# Patient Record
Sex: Female | Born: 1945 | Race: White | Hispanic: No | Marital: Married | Smoking: Never smoker
Health system: Southern US, Community
[De-identification: ages and names within clinical notes are randomized; demographics above are authoritative.]

## PROBLEM LIST (undated history)

## (undated) DIAGNOSIS — R413 Other amnesia: Secondary | ICD-10-CM

## (undated) DIAGNOSIS — T4145XA Adverse effect of unspecified anesthetic, initial encounter: Secondary | ICD-10-CM

## (undated) DIAGNOSIS — E039 Hypothyroidism, unspecified: Secondary | ICD-10-CM

## (undated) DIAGNOSIS — K219 Gastro-esophageal reflux disease without esophagitis: Secondary | ICD-10-CM

## (undated) DIAGNOSIS — Z9889 Other specified postprocedural states: Secondary | ICD-10-CM

## (undated) DIAGNOSIS — F0390 Unspecified dementia without behavioral disturbance: Secondary | ICD-10-CM

## (undated) DIAGNOSIS — T8859XA Other complications of anesthesia, initial encounter: Secondary | ICD-10-CM

## (undated) DIAGNOSIS — F419 Anxiety disorder, unspecified: Secondary | ICD-10-CM

## (undated) DIAGNOSIS — E785 Hyperlipidemia, unspecified: Secondary | ICD-10-CM

## (undated) DIAGNOSIS — I10 Essential (primary) hypertension: Secondary | ICD-10-CM

## (undated) DIAGNOSIS — S72001A Fracture of unspecified part of neck of right femur, initial encounter for closed fracture: Secondary | ICD-10-CM

## (undated) DIAGNOSIS — M1990 Unspecified osteoarthritis, unspecified site: Secondary | ICD-10-CM

## (undated) DIAGNOSIS — R112 Nausea with vomiting, unspecified: Secondary | ICD-10-CM

## (undated) DIAGNOSIS — E119 Type 2 diabetes mellitus without complications: Secondary | ICD-10-CM

## (undated) HISTORY — PX: PARTIAL HYSTERECTOMY: SHX80

---

## 1999-02-08 ENCOUNTER — Encounter: Admission: RE | Admit: 1999-02-08 | Discharge: 1999-05-09 | Payer: Self-pay | Admitting: Internal Medicine

## 2000-04-23 ENCOUNTER — Encounter: Admission: RE | Admit: 2000-04-23 | Discharge: 2000-04-23 | Payer: Self-pay | Admitting: Internal Medicine

## 2000-04-23 ENCOUNTER — Encounter: Payer: Self-pay | Admitting: Internal Medicine

## 2000-07-25 ENCOUNTER — Encounter: Payer: Self-pay | Admitting: Internal Medicine

## 2000-07-25 ENCOUNTER — Encounter: Admission: RE | Admit: 2000-07-25 | Discharge: 2000-07-25 | Payer: Self-pay | Admitting: Internal Medicine

## 2001-02-13 ENCOUNTER — Encounter: Admission: RE | Admit: 2001-02-13 | Discharge: 2001-02-13 | Payer: Self-pay | Admitting: Internal Medicine

## 2001-02-13 ENCOUNTER — Encounter: Payer: Self-pay | Admitting: Internal Medicine

## 2007-08-05 ENCOUNTER — Inpatient Hospital Stay (HOSPITAL_COMMUNITY): Admission: RE | Admit: 2007-08-05 | Discharge: 2007-08-06 | Payer: Self-pay | Admitting: Orthopedic Surgery

## 2008-05-11 ENCOUNTER — Encounter: Admission: RE | Admit: 2008-05-11 | Discharge: 2008-05-11 | Payer: Self-pay | Admitting: Internal Medicine

## 2010-10-25 NOTE — Op Note (Signed)
NAME:  Chelsea Roberson, Chelsea Roberson NO.:  [id number]   MEDICAL RECORD NO.:  [id number]          PATIENT TYPE:  OIB   LOCATION:  5022                         FACILITY:  [facility]   PHYSICIAN:  Madelynn Done, MD  DATE OF BIRTH:  May 27, 1946   DATE OF PROCEDURE:  08/05/2007  DATE OF DISCHARGE:                               OPERATIVE REPORT   PREOPERATIVE DIAGNOSES:  1. Left intra-articular distal radius fracture of four or more      fragments.  2. Left thumb laceration, traumatic laceration.  3. Left ulnar styloid fracture.  4. Diabetes.  5. Obesity.   POSTOPERATIVE DIAGNOSES:  1. Left intra-articular distal radius fracture of four or more      fragments.  2. Left thumb laceration, traumatic laceration.  3. Left ulnar styloid fracture.  4. Diabetes.  5. Obesity.   ATTENDING SURGEON:  Dr. Bradly Bienenstock, who was scrubbed and present for  the entire procedure.   ASSISTANT SURGEON:  None.   PROCEDURE:  1. Repair of traumatic thumb laceration, 2.5 cm.  2. Left wrist, radius open reduction internal fixation of displaced      intra-articular fracture four or more fragments with internal      fixation and treatment of ulnar styloid fracture.  3. Stress x-rays, left wrist.   SURGICAL IMPLANTS:  Hand innovations narrow plate with locking pegs  distally and three 3.5 cortical screws proximally.   ANESTHESIA:  General via endotracheal tube.   TOURNIQUET TIME:  1 hour at 250 mmHg.   INTRAOPERATIVE FINDINGS:  The patient did have a comminuted intra-  articular distal radius fracture of four or more fragments that had a  large volar Barton's equivalent.  Radiographs three views of the wrist  were obtained intraoperatively which do show good restoration of radial  height, inclination and volar tilt with good restoration of the fracture  fragments.  Under live stress radiography, there did not appear to be  any pegs within the radiocarpal joint and a good alignment and good  motion with stress testing.  Intraoperative stress testing was then  done.  There did not appear to be any intercarpal ligament widening for  any instability of the distal radioulnar joint once completion of the  radius was done.   SURGICAL INDICATIONS:  Chelsea Roberson is a 65 year old left-hand dominant  female who sustained a fall earlier this afternoon, sustaining an open  laceration to her left thumb and a displaced intra-articular distal  radius fracture.  The patient was seen and evaluated in the office and  after evaluation, we elected to proceed with the above procedure today.  Risks, benefits and alternatives were discussed in detail with the  patient and signed informed consent was obtained on the day of surgery.  Risks include but not limited to bleeding, infection, damage to nearby  nerves, arteries or tendons, risk of anesthesia, nonunion, malunion,  hardware failure, loss of motion of the wrist and digits and need for  further surgical intervention and dystrophy of the hand.   DESCRIPTION OF PROCEDURE:  The patient was properly identified in preop  holding area  and a mark with a permanent marker was made on left wrist  to indicate correct operative side.  The patient then brought back to  the operating room, placed supine on the anesthesia table where general  anesthesia was administered via an endotracheal tube.  The patient  tolerated this well.  Well-padded tourniquet was then placed on the left  brachium and sealed with a 1000 drape.  Left upper extremity was then  prepped and draped in usual sterile fashion.  Time-out was called, the  correct site was identified and the procedure was then begun.  Attention  was then turned to the 2.5  cm laceration over the thumb where thorough  irrigation and debridement of the laceration was then carried out and  the simple laceration was then closed with three 4-0 nylon simple  sutures.  It was within the subcutaneous tissues and did  not extend  through the correction.  Following closure of the thumb laceration,  attention was then turned to the distal radius.  Using Esmarch  exsanguination, the tourniquet insufflated to 250 mmHg.  Using 10 pounds  of finger trap traction, the traction was then set up.  Longitudinal  incision was then made centered directly over the FCR.  Dissection was  carried down through the skin and subcutaneous tissue.  The interval  between the FCR and the radial artery was then approached.  The FCR  tendon sheath was then opened both proximally and distally.  The FCR  tendon sheath was retracted ulnarly.  Going through the floor of the FCR  sheath the FPL was identified.  An L-shaped pronator flap was then  created to expose the fracture fragments.  Following adequate exposure  of the fragments, an open reduction and extra-articular reduction was  then carried out.  There was a moderate degree of comminution and  several fragments.  Following the open reduction, the volar plate was  then applied to the distal radius and then held temporarily in place  with a 3-5 cortical screw in the oblong hole proximally.  The position  was then adjusted both proximally and distally as well as radially and  ulnarly.  Following C-arm confirmation of the plate placement the distal  fixation was then carried out and locking pegs with appropriate drill  depth gauge measurement and the locking pegs were then placed without  any difficulty.  After the distal construct was carried out the traction  was released.  The final proximal fixation was then achieved with two  more 3-5 bicortical screws.  Following placement of the surgical  implant, and reduction of the intra-articular fracture, final mini C-arm  images were then obtained.  The wounds were then thoroughly irrigated.  The pronator quadratus flap there was not good tissue and then a small  portion of it was closed over the plate distally, but it was unable  be  reapproximated proximally.  Following this, the tourniquet was then  deflated.  Hemostasis was obtained with direct pressure and bipolar  cautery.  Thorough irrigation was then carried out and subcutaneous  tissues closed with 4-0 Vicryl and sutures in the skin closed with a  running horizontal mattress 4-0 nylon suture.  20 mL of 0.5% Marcaine  were then infiltrated around the wound for local field block.  A sterile  compressive dressing was then applied.  Prior to placement of the  dressing the distal radioulnar joint was assessed.  The patient did have  an ulnar styloid fracture at the base  but there did not appear to be any  gross instability in neutral pronation and supination.  Sterile  compressive dressing was then applied.  The patient was then placed in  well molded sugar-tong splint.  She was then extubated and taken to  recovery room in good condition.   POSTOPERATIVE PLAN:  The patient will be admitted overnight for IV  antibiotics and pain control.  She will be likely discharged in the  morning.  Will see her back in the office in 10 days for wound check and  suture removal.  Likely three weeks of long-arm immobilization for the ulnar styloid  fracture and then likely three weeks of short-arm cast and a total of  likely six weeks immobilization.  The patient will be seen at the 10-  day, 3-week and 6-week mark.  X-rays at each visit.      Madelynn Done, MD  Electronically Signed     FWO/MEDQ  D:  08/05/2007  T:  08/06/2007  Job:  [id number]

## 2011-03-06 LAB — CBC
HCT: 40.7
Hemoglobin: 13.9
MCHC: 34.3
MCV: 93.9
Platelets: 156
RBC: 4.34
RDW: 13.8
WBC: 8.7

## 2011-03-06 LAB — COMPREHENSIVE METABOLIC PANEL
BUN: 16
Calcium: 9.1
Creatinine, Ser: 0.52
Glucose, Bld: 139 — ABNORMAL HIGH
Total Protein: 6.5

## 2012-12-25 ENCOUNTER — Encounter (HOSPITAL_COMMUNITY): Payer: Self-pay | Admitting: Nurse Practitioner

## 2012-12-25 ENCOUNTER — Emergency Department (HOSPITAL_COMMUNITY)
Admission: EM | Admit: 2012-12-25 | Discharge: 2012-12-25 | Disposition: A | Discharge status: Home / Self Care | Payer: Medicare Other | Attending: Emergency Medicine | Admitting: Emergency Medicine

## 2012-12-25 ENCOUNTER — Emergency Department (HOSPITAL_COMMUNITY): Payer: Medicare Other

## 2012-12-25 DIAGNOSIS — S29019A Strain of muscle and tendon of unspecified wall of thorax, initial encounter: Secondary | ICD-10-CM

## 2012-12-25 DIAGNOSIS — R079 Chest pain, unspecified: Secondary | ICD-10-CM

## 2012-12-25 DIAGNOSIS — IMO0002 Reserved for concepts with insufficient information to code with codable children: Secondary | ICD-10-CM | POA: Insufficient documentation

## 2012-12-25 DIAGNOSIS — W1809XA Striking against other object with subsequent fall, initial encounter: Secondary | ICD-10-CM | POA: Insufficient documentation

## 2012-12-25 DIAGNOSIS — S239XXA Sprain of unspecified parts of thorax, initial encounter: Secondary | ICD-10-CM | POA: Insufficient documentation

## 2012-12-25 DIAGNOSIS — S46011A Strain of muscle(s) and tendon(s) of the rotator cuff of right shoulder, initial encounter: Secondary | ICD-10-CM

## 2012-12-25 DIAGNOSIS — Y929 Unspecified place or not applicable: Secondary | ICD-10-CM | POA: Insufficient documentation

## 2012-12-25 DIAGNOSIS — S298XXA Other specified injuries of thorax, initial encounter: Secondary | ICD-10-CM | POA: Insufficient documentation

## 2012-12-25 DIAGNOSIS — E119 Type 2 diabetes mellitus without complications: Secondary | ICD-10-CM | POA: Insufficient documentation

## 2012-12-25 DIAGNOSIS — Y939 Activity, unspecified: Secondary | ICD-10-CM | POA: Insufficient documentation

## 2012-12-25 DIAGNOSIS — I10 Essential (primary) hypertension: Secondary | ICD-10-CM | POA: Insufficient documentation

## 2012-12-25 HISTORY — DX: Essential (primary) hypertension: I10

## 2012-12-25 LAB — D-DIMER, QUANTITATIVE: D-Dimer, Quant: 0.3 ug/mL-FEU (ref 0.00–0.48)

## 2012-12-25 LAB — CBC
HCT: 34.6 % — ABNORMAL LOW (ref 36.0–46.0)
Hemoglobin: 11.5 g/dL — ABNORMAL LOW (ref 12.0–15.0)
MCV: 89.9 fL (ref 78.0–100.0)
Platelets: 150 10*3/uL (ref 150–400)
RBC: 3.85 MIL/uL — ABNORMAL LOW (ref 3.87–5.11)
WBC: 6 10*3/uL (ref 4.0–10.5)

## 2012-12-25 LAB — BASIC METABOLIC PANEL
CO2: 26 mEq/L (ref 19–32)
Chloride: 104 mEq/L (ref 96–112)
Creatinine, Ser: 0.71 mg/dL (ref 0.50–1.10)

## 2012-12-25 LAB — POCT I-STAT TROPONIN I: Troponin i, poc: 0.02 ng/mL (ref 0.00–0.08)

## 2012-12-25 MED ORDER — NAPROXEN SODIUM 220 MG PO TABS: 220.0000 mg | ORAL_TABLET | Freq: Two times a day (BID) | ORAL | Status: DC

## 2012-12-25 MED ORDER — POTASSIUM CHLORIDE CRYS ER 20 MEQ PO TBCR
40.0000 meq | EXTENDED_RELEASE_TABLET | Freq: Once | ORAL | Status: AC
  Administered 2012-12-25: 40 meq via ORAL
  Filled 2012-12-25: qty 2

## 2012-12-25 MED ORDER — HYDROCODONE-ACETAMINOPHEN 5-325 MG PO TABS: 1.0000 | ORAL_TABLET | ORAL | Status: DC | PRN

## 2012-12-25 NOTE — ED Notes (Signed)
MD at bedside. 

## 2012-12-25 NOTE — ED Notes (Signed)
EKG was done at 15:12 and signed by DR Denton Lank

## 2012-12-25 NOTE — ED Provider Notes (Signed)
History    CSN: [id number] Arrival date & time 12/25/12  1419  First MD Initiated Contact with Patient 12/25/12 1500     Chief Complaint  Patient presents with  . Chest Pain   (Consider location/radiation/quality/duration/timing/severity/associated sxs/prior Treatment) HPI Comments: Pt w/ PMHx of DM, HTN and HLD now w/ chest pain. States 1.5 wks of right shoulder, chest wall pain. Pleuritic, a/w dyspnea. Not exertional. [place] Seat 1 wk ago against dresser and hit right arm/chest. Pain exacerbated w/ laying on right side. No nausea, diaphoresis, cough, fever or rash. No Hx of CAD. Clean cath 20 yrs ago, no recent stress test. Pain is constant and only a/w inspiration and laying on right side. Denies radiculopathy or weakness of right arm. Pain exacerbated w/ shoulder extension.   Patient is a 67 y.o. female presenting with general illness. The history is provided by the patient. No language interpreter was used.  Illness Location:  Musculoskeletal, cardio/pulm Quality:  Chest and shoulder pain Severity:  Moderate Onset quality:  Gradual Duration:  10 days Timing:  Intermittent Progression:  Worsening Chronicity:  New Associated symptoms: chest pain, myalgias and shortness of breath   Associated symptoms: no abdominal pain, no congestion, no cough, no diarrhea, no fever, no headaches, no nausea, no rash, no sore throat and no vomiting    Past Medical History  Diagnosis Date  . Hypertension   . Diabetes mellitus without complication    History reviewed. No pertinent past surgical history. History reviewed. No pertinent family history. History  Substance Use Topics  . Smoking status: Never Smoker   . Smokeless tobacco: Not on file  . Alcohol Use: No   OB History   Grav Para Term Preterm Abortions TAB SAB Ect Mult Living                 Review of Systems  Constitutional: Negative for fever and chills.  HENT: Negative for congestion and sore throat.   Respiratory: Positive for  shortness of breath. Negative for cough.   Cardiovascular: Positive for chest pain. Negative for leg swelling.  Gastrointestinal: Negative for nausea, vomiting, abdominal pain, diarrhea and constipation.  Genitourinary: Negative for dysuria and frequency.  Musculoskeletal: Positive for myalgias and back pain.  Skin: Negative for color change and rash.  Neurological: Negative for dizziness and headaches.  Psychiatric/Behavioral: Negative for confusion and agitation.  All other systems reviewed and are negative.    Allergies  Sulfa antibiotics  Home Medications  No current outpatient prescriptions on file. BP 175/68  Pulse 81  Temp(Src) 98 F (36.7 C) (Oral)  Resp 16  Ht 5\' 2"  (1.575 m)  Wt 215 lb (97.523 kg)  BMI 39.31 kg/m2  SpO2 97% Physical Exam  Vitals reviewed. Constitutional: She is oriented to person, place, and time. She appears well-developed and well-nourished. No distress.  HENT:  Head: Normocephalic and atraumatic.  Eyes: EOM are normal. Pupils are equal, round, and reactive to light.  Neck: Normal range of motion. Neck supple.  Cardiovascular: Normal rate, regular rhythm and normal heart sounds.   Pulmonary/Chest: Effort normal and breath sounds normal. Not tachypneic. No respiratory distress. She has no decreased breath sounds. She has no wheezes. She has no rhonchi. She has no rales.    Abdominal: Soft. She exhibits no distension.  Musculoskeletal: Normal range of motion. She exhibits no edema.       Arms: Neurological: She is alert and oriented to person, place, and time.  Skin: Skin is warm and dry.  Psychiatric:  She has a normal mood and affect. Her behavior is normal.    ED Course  Procedures (including critical care time) DG Chest 2 View (Final result)  Result time: 12/25/12 15:42:56    Final result by Rad Results In Interface (12/25/12 15:42:56)    Narrative:   *RADIOLOGY REPORT*  Clinical Data: Chest pain. Weakness. Right posterior  shoulder pain with limited range of motion.  CHEST - 2 VIEW  Comparison: 09/28/2011  Findings: Mild cardiomegaly observed with mild lingular scarring. Thoracic spondylosis is present. There is mild tortuosity of the thoracic aorta. Bilateral humeral head spurring noted. No pleural effusion identified.  IMPRESSION:  1. Mild cardiomegaly, without edema. 2. Minimal lingular scarring. 3. Degenerative spurring of the humeral heads bilaterally. 4. Thoracic spondylosis.   Original Report Authenticated By: Gaylyn Rong, M.D.             DG Shoulder Right (Final result)  Result time: 12/25/12 15:44:25    Final result by Rad Results In Interface (12/25/12 15:44:25)    Narrative:   *RADIOLOGY REPORT*  Clinical Data: Chest pain. Weakness. Right posterior shoulder pain with reduced range of motion.  RIGHT SHOULDER - 2+ VIEW  Comparison: None.  Findings: Prominent inferomedial spurring of the humeral head observed. There is spurring of the glenoid rim.  No fracture or dislocation.  IMPRESSION:  1. Degenerative glenohumeral arthropathy with prominent spurring from the humeral head to moderate spurring of the glenoid.   Original Report Authenticated By: Gaylyn Rong, M.D.         Date: 12/25/2012  Rate: 74  Rhythm: normal sinus rhythm  QRS Axis: normal  Intervals: normal  ST/T Wave abnormalities: normal  Conduction Disutrbances:none  Narrative Interpretation:   Old EKG Reviewed: none available  Results for orders placed during the hospital encounter of 12/25/12  CBC      Result Value Range   WBC 6.0  4.0 - 10.5 K/uL   RBC 3.85 (*) 3.87 - 5.11 MIL/uL   Hemoglobin 11.5 (*) 12.0 - 15.0 g/dL   HCT 47.8 (*) 29.5 - 62.1 %   MCV 89.9  78.0 - 100.0 fL   MCH 29.9  26.0 - 34.0 pg   MCHC 33.2  30.0 - 36.0 g/dL   RDW 30.8  65.7 - 84.6 %   Platelets 150  150 - 400 K/uL  BASIC METABOLIC PANEL      Result Value Range   Sodium 138  135 - 145 mEq/L    Potassium 3.4 (*) 3.5 - 5.1 mEq/L   Chloride 104  96 - 112 mEq/L   CO2 26  19 - 32 mEq/L   Glucose, Bld 124 (*) 70 - 99 mg/dL   BUN 17  6 - 23 mg/dL   Creatinine, Ser 9.62  0.50 - 1.10 mg/dL   Calcium 9.4  8.4 - 95.2 mg/dL   GFR calc non Af Amer 87 (*) >90 mL/min   GFR calc Af Amer >90  >90 mL/min  D-DIMER, QUANTITATIVE      Result Value Range   D-Dimer, Quant 0.30  0.00 - 0.48 ug/mL-FEU  POCT I-STAT TROPONIN I      Result Value Range   Troponin i, poc 0.02  0.00 - 0.08 ng/mL   Comment 3           POCT I-STAT TROPONIN I      Result Value Range   Troponin i, poc 0.02  0.00 - 0.08 ng/mL   Comment 3  No results found. No diagnosis found.  MDM  Exam as above, significant for pain w/ rotator cuff provocative testing, ttp right paraspinal upper thoracic. No dermatomal rash. ECG w/out acute ischemia. CXR - NACPF, No fx, no ptx, no infiltrated, mild cardiomegaly. right shoulder xray w/ degenerative GH changes. 3 hr troponin x 2 negative, no anemia, no renal failure, 3.4 - given 40 meq PO. D. Dimer neg. All other labs unremarkable.   Doubt ACS, spont ptx, dissection, tamponade, boerhaave, or shingles. Likely rotator cuff and thoracic strain - will recommend aleve BID, give small quant norco for break through pain. Follow up w/ pts ortho for further eval. D/w cardiology  - Dr Anne Fu - pt will follow up in outpt office for further eval and stress test. They will call for apt.  At this time chest pain is atypical - low likelihood for ACS. Stable for d/c home. Given return precautions.   I have personally reviewed labs and imaging and considered in my MDM. Case d/w Dr Hyacinth Meeker  1. Rotator cuff (capsule) sprain and strain, right, initial encounter   2. Thoracic myofascial strain, initial encounter   3. Chest pain    Discharge Medication List as of 12/25/2012  7:25 PM    START taking these medications   Details  HYDROcodone-acetaminophen (NORCO) 5-325 MG per tablet Take 1 tablet by  mouth every 4 (four) hours as needed for pain., Starting 12/25/2012, Until Discontinued, Print    naproxen sodium (ALEVE) 220 MG tablet Take 1 tablet (220 mg total) by mouth 2 (two) times daily with a meal., Starting 12/25/2012, Until Discontinued, Print       Lesleigh Noe, MD [address] [place] [address] [phone]  Schedule an appointment as soon as possible for a visit      follow up with your orthopedic doctor for further eval of shoulder pain    Audelia Hives, MD 12/25/12 1949

## 2012-12-25 NOTE — ED Notes (Signed)
Patient transported to X-ray 

## 2012-12-25 NOTE — ED Notes (Signed)
Pt reports nagging R arm pain since last week, over past 2 days pain began to move into R breast and then R chest. Pain is worse with inspiration. Pt did trip last week and hit her R arm on a bookcase, bruise noted to RFA. A&Ox4, resp e/u

## 2012-12-28 NOTE — ED Provider Notes (Signed)
I have personally evaluated this patient with the resident, I agree with their interpretation of the EKG, I have personally interpreted the EKG as well. His pain seems to be pleuritic, reproducible only with deep breathing, she does note having a fall on her side with shoulder pain and chest pain however this pain is nonreproducible on my exam. She has clear heart and lung sounds, no peripheral edema, workup is unremarkable for cardiac etiology.  I saw and evaluated the patient, reviewed the resident's note and I agree with the findings and plan.   Vida Roller, MD 12/28/12 [phone]

## 2013-01-02 ENCOUNTER — Other Ambulatory Visit: Payer: Self-pay | Admitting: Internal Medicine

## 2013-01-02 DIAGNOSIS — E010 Iodine-deficiency related diffuse (endemic) goiter: Secondary | ICD-10-CM

## 2013-01-06 ENCOUNTER — Other Ambulatory Visit: Payer: Medicare Other

## 2013-01-20 ENCOUNTER — Ambulatory Visit
Admission: RE | Admit: 2013-01-20 | Discharge: 2013-01-20 | Disposition: A | Discharge status: Home / Self Care | Payer: Medicare Other | Source: Ambulatory Visit | Attending: Internal Medicine | Admitting: Internal Medicine

## 2013-01-20 DIAGNOSIS — E010 Iodine-deficiency related diffuse (endemic) goiter: Secondary | ICD-10-CM

## 2013-01-23 ENCOUNTER — Other Ambulatory Visit: Payer: Self-pay | Admitting: Internal Medicine

## 2013-01-23 DIAGNOSIS — E041 Nontoxic single thyroid nodule: Secondary | ICD-10-CM

## 2013-01-27 ENCOUNTER — Other Ambulatory Visit: Payer: Self-pay | Admitting: Internal Medicine

## 2013-01-27 DIAGNOSIS — E041 Nontoxic single thyroid nodule: Secondary | ICD-10-CM

## 2013-01-29 ENCOUNTER — Other Ambulatory Visit (HOSPITAL_COMMUNITY)
Admission: RE | Admit: 2013-01-29 | Discharge: 2013-01-29 | Disposition: A | Discharge status: Home / Self Care | Payer: Medicare Other | Source: Ambulatory Visit | Attending: Internal Medicine | Admitting: Internal Medicine

## 2013-01-29 ENCOUNTER — Ambulatory Visit
Admission: RE | Admit: 2013-01-29 | Discharge: 2013-01-29 | Disposition: A | Discharge status: Home / Self Care | Payer: Medicare Other | Source: Ambulatory Visit | Attending: Internal Medicine | Admitting: Internal Medicine

## 2013-01-29 DIAGNOSIS — E041 Nontoxic single thyroid nodule: Secondary | ICD-10-CM

## 2013-04-02 ENCOUNTER — Encounter (INDEPENDENT_AMBULATORY_CARE_PROVIDER_SITE_OTHER): Payer: Medicare Other | Admitting: Ophthalmology

## 2013-04-02 DIAGNOSIS — I10 Essential (primary) hypertension: Secondary | ICD-10-CM

## 2013-04-02 DIAGNOSIS — H35039 Hypertensive retinopathy, unspecified eye: Secondary | ICD-10-CM

## 2013-04-02 DIAGNOSIS — E1139 Type 2 diabetes mellitus with other diabetic ophthalmic complication: Secondary | ICD-10-CM

## 2013-04-02 DIAGNOSIS — H43819 Vitreous degeneration, unspecified eye: Secondary | ICD-10-CM

## 2013-04-02 DIAGNOSIS — E11319 Type 2 diabetes mellitus with unspecified diabetic retinopathy without macular edema: Secondary | ICD-10-CM

## 2013-04-02 DIAGNOSIS — H2510 Age-related nuclear cataract, unspecified eye: Secondary | ICD-10-CM

## 2013-04-02 DIAGNOSIS — E11311 Type 2 diabetes mellitus with unspecified diabetic retinopathy with macular edema: Secondary | ICD-10-CM

## 2013-04-07 ENCOUNTER — Encounter (INDEPENDENT_AMBULATORY_CARE_PROVIDER_SITE_OTHER): Payer: Medicare Other | Admitting: Ophthalmology

## 2013-04-07 DIAGNOSIS — E11319 Type 2 diabetes mellitus with unspecified diabetic retinopathy without macular edema: Secondary | ICD-10-CM

## 2013-04-07 DIAGNOSIS — H35039 Hypertensive retinopathy, unspecified eye: Secondary | ICD-10-CM

## 2013-04-07 DIAGNOSIS — H43819 Vitreous degeneration, unspecified eye: Secondary | ICD-10-CM

## 2013-04-07 DIAGNOSIS — E11311 Type 2 diabetes mellitus with unspecified diabetic retinopathy with macular edema: Secondary | ICD-10-CM

## 2013-04-07 DIAGNOSIS — I10 Essential (primary) hypertension: Secondary | ICD-10-CM

## 2013-04-07 DIAGNOSIS — E1139 Type 2 diabetes mellitus with other diabetic ophthalmic complication: Secondary | ICD-10-CM

## 2013-04-30 ENCOUNTER — Encounter (INDEPENDENT_AMBULATORY_CARE_PROVIDER_SITE_OTHER): Payer: Medicare Other | Admitting: Ophthalmology

## 2013-04-30 DIAGNOSIS — H43819 Vitreous degeneration, unspecified eye: Secondary | ICD-10-CM

## 2013-04-30 DIAGNOSIS — E1139 Type 2 diabetes mellitus with other diabetic ophthalmic complication: Secondary | ICD-10-CM

## 2013-04-30 DIAGNOSIS — H35039 Hypertensive retinopathy, unspecified eye: Secondary | ICD-10-CM

## 2013-04-30 DIAGNOSIS — E11319 Type 2 diabetes mellitus with unspecified diabetic retinopathy without macular edema: Secondary | ICD-10-CM

## 2013-04-30 DIAGNOSIS — E11311 Type 2 diabetes mellitus with unspecified diabetic retinopathy with macular edema: Secondary | ICD-10-CM

## 2013-04-30 DIAGNOSIS — H2510 Age-related nuclear cataract, unspecified eye: Secondary | ICD-10-CM

## 2013-04-30 DIAGNOSIS — I10 Essential (primary) hypertension: Secondary | ICD-10-CM

## 2013-05-28 ENCOUNTER — Encounter (INDEPENDENT_AMBULATORY_CARE_PROVIDER_SITE_OTHER): Payer: Medicare Other | Admitting: Ophthalmology

## 2013-05-28 DIAGNOSIS — H35039 Hypertensive retinopathy, unspecified eye: Secondary | ICD-10-CM

## 2013-05-28 DIAGNOSIS — E11311 Type 2 diabetes mellitus with unspecified diabetic retinopathy with macular edema: Secondary | ICD-10-CM

## 2013-05-28 DIAGNOSIS — E11319 Type 2 diabetes mellitus with unspecified diabetic retinopathy without macular edema: Secondary | ICD-10-CM

## 2013-05-28 DIAGNOSIS — E1139 Type 2 diabetes mellitus with other diabetic ophthalmic complication: Secondary | ICD-10-CM

## 2013-05-28 DIAGNOSIS — H43819 Vitreous degeneration, unspecified eye: Secondary | ICD-10-CM

## 2013-05-28 DIAGNOSIS — I10 Essential (primary) hypertension: Secondary | ICD-10-CM

## 2013-06-23 ENCOUNTER — Encounter (INDEPENDENT_AMBULATORY_CARE_PROVIDER_SITE_OTHER): Payer: Medicare Other | Admitting: Ophthalmology

## 2013-06-23 DIAGNOSIS — E11319 Type 2 diabetes mellitus with unspecified diabetic retinopathy without macular edema: Secondary | ICD-10-CM

## 2013-06-23 DIAGNOSIS — H2510 Age-related nuclear cataract, unspecified eye: Secondary | ICD-10-CM

## 2013-06-23 DIAGNOSIS — H35039 Hypertensive retinopathy, unspecified eye: Secondary | ICD-10-CM

## 2013-06-23 DIAGNOSIS — E11311 Type 2 diabetes mellitus with unspecified diabetic retinopathy with macular edema: Secondary | ICD-10-CM

## 2013-06-23 DIAGNOSIS — H43819 Vitreous degeneration, unspecified eye: Secondary | ICD-10-CM

## 2013-06-23 DIAGNOSIS — I10 Essential (primary) hypertension: Secondary | ICD-10-CM

## 2013-06-23 DIAGNOSIS — E1165 Type 2 diabetes mellitus with hyperglycemia: Secondary | ICD-10-CM

## 2013-06-23 DIAGNOSIS — E1139 Type 2 diabetes mellitus with other diabetic ophthalmic complication: Secondary | ICD-10-CM

## 2013-07-21 ENCOUNTER — Encounter (INDEPENDENT_AMBULATORY_CARE_PROVIDER_SITE_OTHER): Payer: Medicare Other | Admitting: Ophthalmology

## 2013-07-21 DIAGNOSIS — H43819 Vitreous degeneration, unspecified eye: Secondary | ICD-10-CM

## 2013-07-21 DIAGNOSIS — H35039 Hypertensive retinopathy, unspecified eye: Secondary | ICD-10-CM

## 2013-07-21 DIAGNOSIS — H3581 Retinal edema: Secondary | ICD-10-CM

## 2013-07-21 DIAGNOSIS — I10 Essential (primary) hypertension: Secondary | ICD-10-CM

## 2013-07-21 DIAGNOSIS — E1165 Type 2 diabetes mellitus with hyperglycemia: Secondary | ICD-10-CM

## 2013-07-21 DIAGNOSIS — E1139 Type 2 diabetes mellitus with other diabetic ophthalmic complication: Secondary | ICD-10-CM

## 2013-07-21 DIAGNOSIS — H2510 Age-related nuclear cataract, unspecified eye: Secondary | ICD-10-CM

## 2013-07-21 DIAGNOSIS — E11319 Type 2 diabetes mellitus with unspecified diabetic retinopathy without macular edema: Secondary | ICD-10-CM

## 2013-09-29 ENCOUNTER — Encounter (INDEPENDENT_AMBULATORY_CARE_PROVIDER_SITE_OTHER): Payer: Medicare Other | Admitting: Ophthalmology

## 2013-09-29 DIAGNOSIS — E11311 Type 2 diabetes mellitus with unspecified diabetic retinopathy with macular edema: Secondary | ICD-10-CM

## 2013-09-29 DIAGNOSIS — H35039 Hypertensive retinopathy, unspecified eye: Secondary | ICD-10-CM

## 2013-09-29 DIAGNOSIS — I10 Essential (primary) hypertension: Secondary | ICD-10-CM

## 2013-09-29 DIAGNOSIS — H2510 Age-related nuclear cataract, unspecified eye: Secondary | ICD-10-CM

## 2013-09-29 DIAGNOSIS — E11319 Type 2 diabetes mellitus with unspecified diabetic retinopathy without macular edema: Secondary | ICD-10-CM

## 2013-09-29 DIAGNOSIS — E1139 Type 2 diabetes mellitus with other diabetic ophthalmic complication: Secondary | ICD-10-CM

## 2013-09-29 DIAGNOSIS — H43819 Vitreous degeneration, unspecified eye: Secondary | ICD-10-CM

## 2013-09-29 DIAGNOSIS — E1165 Type 2 diabetes mellitus with hyperglycemia: Secondary | ICD-10-CM

## 2013-10-27 ENCOUNTER — Encounter (INDEPENDENT_AMBULATORY_CARE_PROVIDER_SITE_OTHER): Payer: Medicare Other | Admitting: Ophthalmology

## 2013-10-27 DIAGNOSIS — E1165 Type 2 diabetes mellitus with hyperglycemia: Secondary | ICD-10-CM

## 2013-10-27 DIAGNOSIS — E11311 Type 2 diabetes mellitus with unspecified diabetic retinopathy with macular edema: Secondary | ICD-10-CM

## 2013-10-27 DIAGNOSIS — H43819 Vitreous degeneration, unspecified eye: Secondary | ICD-10-CM

## 2013-10-27 DIAGNOSIS — E11319 Type 2 diabetes mellitus with unspecified diabetic retinopathy without macular edema: Secondary | ICD-10-CM

## 2013-10-27 DIAGNOSIS — I10 Essential (primary) hypertension: Secondary | ICD-10-CM

## 2013-10-27 DIAGNOSIS — H35039 Hypertensive retinopathy, unspecified eye: Secondary | ICD-10-CM

## 2013-10-27 DIAGNOSIS — H2510 Age-related nuclear cataract, unspecified eye: Secondary | ICD-10-CM

## 2013-10-27 DIAGNOSIS — E1139 Type 2 diabetes mellitus with other diabetic ophthalmic complication: Secondary | ICD-10-CM

## 2013-11-24 ENCOUNTER — Encounter (INDEPENDENT_AMBULATORY_CARE_PROVIDER_SITE_OTHER): Payer: Medicare Other | Admitting: Ophthalmology

## 2013-11-24 DIAGNOSIS — E1165 Type 2 diabetes mellitus with hyperglycemia: Secondary | ICD-10-CM

## 2013-11-24 DIAGNOSIS — E11319 Type 2 diabetes mellitus with unspecified diabetic retinopathy without macular edema: Secondary | ICD-10-CM

## 2013-11-24 DIAGNOSIS — I10 Essential (primary) hypertension: Secondary | ICD-10-CM

## 2013-11-24 DIAGNOSIS — H2510 Age-related nuclear cataract, unspecified eye: Secondary | ICD-10-CM

## 2013-11-24 DIAGNOSIS — H35039 Hypertensive retinopathy, unspecified eye: Secondary | ICD-10-CM

## 2013-11-24 DIAGNOSIS — E11311 Type 2 diabetes mellitus with unspecified diabetic retinopathy with macular edema: Secondary | ICD-10-CM

## 2013-11-24 DIAGNOSIS — E1139 Type 2 diabetes mellitus with other diabetic ophthalmic complication: Secondary | ICD-10-CM

## 2014-01-19 ENCOUNTER — Encounter (INDEPENDENT_AMBULATORY_CARE_PROVIDER_SITE_OTHER): Payer: Medicare Other | Admitting: Ophthalmology

## 2014-01-19 DIAGNOSIS — H43819 Vitreous degeneration, unspecified eye: Secondary | ICD-10-CM

## 2014-01-19 DIAGNOSIS — E1165 Type 2 diabetes mellitus with hyperglycemia: Secondary | ICD-10-CM

## 2014-01-19 DIAGNOSIS — E11319 Type 2 diabetes mellitus with unspecified diabetic retinopathy without macular edema: Secondary | ICD-10-CM

## 2014-01-19 DIAGNOSIS — E11311 Type 2 diabetes mellitus with unspecified diabetic retinopathy with macular edema: Secondary | ICD-10-CM

## 2014-01-19 DIAGNOSIS — H35039 Hypertensive retinopathy, unspecified eye: Secondary | ICD-10-CM

## 2014-01-19 DIAGNOSIS — I10 Essential (primary) hypertension: Secondary | ICD-10-CM

## 2014-01-19 DIAGNOSIS — E1139 Type 2 diabetes mellitus with other diabetic ophthalmic complication: Secondary | ICD-10-CM

## 2014-02-18 ENCOUNTER — Encounter (INDEPENDENT_AMBULATORY_CARE_PROVIDER_SITE_OTHER): Payer: Medicare Other | Admitting: Ophthalmology

## 2014-02-18 DIAGNOSIS — H2510 Age-related nuclear cataract, unspecified eye: Secondary | ICD-10-CM

## 2014-02-18 DIAGNOSIS — E1139 Type 2 diabetes mellitus with other diabetic ophthalmic complication: Secondary | ICD-10-CM

## 2014-02-18 DIAGNOSIS — E1165 Type 2 diabetes mellitus with hyperglycemia: Secondary | ICD-10-CM

## 2014-02-18 DIAGNOSIS — E11311 Type 2 diabetes mellitus with unspecified diabetic retinopathy with macular edema: Secondary | ICD-10-CM

## 2014-02-18 DIAGNOSIS — I10 Essential (primary) hypertension: Secondary | ICD-10-CM

## 2014-02-18 DIAGNOSIS — H43819 Vitreous degeneration, unspecified eye: Secondary | ICD-10-CM

## 2014-02-18 DIAGNOSIS — E11319 Type 2 diabetes mellitus with unspecified diabetic retinopathy without macular edema: Secondary | ICD-10-CM

## 2014-02-18 DIAGNOSIS — H35039 Hypertensive retinopathy, unspecified eye: Secondary | ICD-10-CM

## 2014-04-01 ENCOUNTER — Encounter (INDEPENDENT_AMBULATORY_CARE_PROVIDER_SITE_OTHER): Payer: Medicare Other | Admitting: Ophthalmology

## 2014-04-01 DIAGNOSIS — H43813 Vitreous degeneration, bilateral: Secondary | ICD-10-CM

## 2014-04-01 DIAGNOSIS — H35033 Hypertensive retinopathy, bilateral: Secondary | ICD-10-CM

## 2014-04-01 DIAGNOSIS — E11311 Type 2 diabetes mellitus with unspecified diabetic retinopathy with macular edema: Secondary | ICD-10-CM

## 2014-04-01 DIAGNOSIS — E11321 Type 2 diabetes mellitus with mild nonproliferative diabetic retinopathy with macular edema: Secondary | ICD-10-CM

## 2014-04-01 DIAGNOSIS — I10 Essential (primary) hypertension: Secondary | ICD-10-CM

## 2014-04-07 ENCOUNTER — Other Ambulatory Visit: Payer: Self-pay | Admitting: Internal Medicine

## 2014-04-07 DIAGNOSIS — E042 Nontoxic multinodular goiter: Secondary | ICD-10-CM

## 2014-04-14 ENCOUNTER — Encounter (INDEPENDENT_AMBULATORY_CARE_PROVIDER_SITE_OTHER): Payer: Self-pay

## 2014-04-14 ENCOUNTER — Ambulatory Visit
Admission: RE | Admit: 2014-04-14 | Discharge: 2014-04-14 | Disposition: A | Discharge status: Home / Self Care | Payer: Medicare Other | Source: Ambulatory Visit | Attending: Internal Medicine | Admitting: Internal Medicine

## 2014-04-14 DIAGNOSIS — E042 Nontoxic multinodular goiter: Secondary | ICD-10-CM

## 2014-05-20 ENCOUNTER — Encounter (INDEPENDENT_AMBULATORY_CARE_PROVIDER_SITE_OTHER): Payer: Medicare Other | Admitting: Ophthalmology

## 2014-05-20 DIAGNOSIS — I10 Essential (primary) hypertension: Secondary | ICD-10-CM

## 2014-05-20 DIAGNOSIS — H35033 Hypertensive retinopathy, bilateral: Secondary | ICD-10-CM

## 2014-05-20 DIAGNOSIS — H43813 Vitreous degeneration, bilateral: Secondary | ICD-10-CM

## 2014-05-20 DIAGNOSIS — H2513 Age-related nuclear cataract, bilateral: Secondary | ICD-10-CM

## 2014-05-20 DIAGNOSIS — E11329 Type 2 diabetes mellitus with mild nonproliferative diabetic retinopathy without macular edema: Secondary | ICD-10-CM

## 2014-05-20 DIAGNOSIS — E11321 Type 2 diabetes mellitus with mild nonproliferative diabetic retinopathy with macular edema: Secondary | ICD-10-CM

## 2014-05-20 DIAGNOSIS — E11311 Type 2 diabetes mellitus with unspecified diabetic retinopathy with macular edema: Secondary | ICD-10-CM

## 2014-07-01 ENCOUNTER — Encounter (INDEPENDENT_AMBULATORY_CARE_PROVIDER_SITE_OTHER): Payer: Medicare Other | Admitting: Ophthalmology

## 2014-07-01 DIAGNOSIS — H43813 Vitreous degeneration, bilateral: Secondary | ICD-10-CM

## 2014-07-01 DIAGNOSIS — H35033 Hypertensive retinopathy, bilateral: Secondary | ICD-10-CM

## 2014-07-01 DIAGNOSIS — H2513 Age-related nuclear cataract, bilateral: Secondary | ICD-10-CM

## 2014-07-01 DIAGNOSIS — E11321 Type 2 diabetes mellitus with mild nonproliferative diabetic retinopathy with macular edema: Secondary | ICD-10-CM

## 2014-07-01 DIAGNOSIS — I10 Essential (primary) hypertension: Secondary | ICD-10-CM

## 2014-07-01 DIAGNOSIS — E11311 Type 2 diabetes mellitus with unspecified diabetic retinopathy with macular edema: Secondary | ICD-10-CM

## 2014-08-12 ENCOUNTER — Encounter (INDEPENDENT_AMBULATORY_CARE_PROVIDER_SITE_OTHER): Payer: Medicare Other | Admitting: Ophthalmology

## 2014-08-12 DIAGNOSIS — I10 Essential (primary) hypertension: Secondary | ICD-10-CM | POA: Diagnosis not present

## 2014-08-12 DIAGNOSIS — H35033 Hypertensive retinopathy, bilateral: Secondary | ICD-10-CM | POA: Diagnosis not present

## 2014-08-12 DIAGNOSIS — H43813 Vitreous degeneration, bilateral: Secondary | ICD-10-CM | POA: Diagnosis not present

## 2014-08-12 DIAGNOSIS — E11321 Type 2 diabetes mellitus with mild nonproliferative diabetic retinopathy with macular edema: Secondary | ICD-10-CM

## 2014-08-12 DIAGNOSIS — H2513 Age-related nuclear cataract, bilateral: Secondary | ICD-10-CM | POA: Diagnosis not present

## 2014-08-12 DIAGNOSIS — E11311 Type 2 diabetes mellitus with unspecified diabetic retinopathy with macular edema: Secondary | ICD-10-CM | POA: Diagnosis not present

## 2014-09-23 ENCOUNTER — Encounter (INDEPENDENT_AMBULATORY_CARE_PROVIDER_SITE_OTHER): Payer: Medicare Other | Admitting: Ophthalmology

## 2014-09-23 DIAGNOSIS — H2513 Age-related nuclear cataract, bilateral: Secondary | ICD-10-CM

## 2014-09-23 DIAGNOSIS — E11321 Type 2 diabetes mellitus with mild nonproliferative diabetic retinopathy with macular edema: Secondary | ICD-10-CM

## 2014-09-23 DIAGNOSIS — I10 Essential (primary) hypertension: Secondary | ICD-10-CM | POA: Diagnosis not present

## 2014-09-23 DIAGNOSIS — H43813 Vitreous degeneration, bilateral: Secondary | ICD-10-CM | POA: Diagnosis not present

## 2014-09-23 DIAGNOSIS — E11311 Type 2 diabetes mellitus with unspecified diabetic retinopathy with macular edema: Secondary | ICD-10-CM | POA: Diagnosis not present

## 2014-09-23 DIAGNOSIS — H35033 Hypertensive retinopathy, bilateral: Secondary | ICD-10-CM

## 2014-11-02 DIAGNOSIS — R42 Dizziness and giddiness: Secondary | ICD-10-CM | POA: Insufficient documentation

## 2014-11-02 DIAGNOSIS — R002 Palpitations: Secondary | ICD-10-CM | POA: Insufficient documentation

## 2014-11-04 ENCOUNTER — Encounter (INDEPENDENT_AMBULATORY_CARE_PROVIDER_SITE_OTHER): Payer: Medicare Other | Admitting: Ophthalmology

## 2014-11-04 DIAGNOSIS — E11311 Type 2 diabetes mellitus with unspecified diabetic retinopathy with macular edema: Secondary | ICD-10-CM | POA: Diagnosis not present

## 2014-11-04 DIAGNOSIS — E11321 Type 2 diabetes mellitus with mild nonproliferative diabetic retinopathy with macular edema: Secondary | ICD-10-CM

## 2014-11-04 DIAGNOSIS — H35033 Hypertensive retinopathy, bilateral: Secondary | ICD-10-CM | POA: Diagnosis not present

## 2014-11-04 DIAGNOSIS — I10 Essential (primary) hypertension: Secondary | ICD-10-CM

## 2014-11-04 DIAGNOSIS — H43813 Vitreous degeneration, bilateral: Secondary | ICD-10-CM | POA: Diagnosis not present

## 2014-11-11 ENCOUNTER — Other Ambulatory Visit: Payer: Self-pay | Admitting: Internal Medicine

## 2014-11-11 ENCOUNTER — Ambulatory Visit
Admission: RE | Admit: 2014-11-11 | Discharge: 2014-11-11 | Disposition: A | Discharge status: Home / Self Care | Payer: Medicare Other | Source: Ambulatory Visit | Attending: Internal Medicine | Admitting: Internal Medicine

## 2014-11-11 DIAGNOSIS — M25551 Pain in right hip: Secondary | ICD-10-CM

## 2014-11-23 ENCOUNTER — Ambulatory Visit (INDEPENDENT_AMBULATORY_CARE_PROVIDER_SITE_OTHER): Payer: Medicare Other

## 2014-11-23 DIAGNOSIS — R42 Dizziness and giddiness: Secondary | ICD-10-CM | POA: Diagnosis not present

## 2014-11-23 DIAGNOSIS — R002 Palpitations: Secondary | ICD-10-CM | POA: Diagnosis not present

## 2014-12-16 ENCOUNTER — Encounter (INDEPENDENT_AMBULATORY_CARE_PROVIDER_SITE_OTHER): Payer: Medicare Other | Admitting: Ophthalmology

## 2014-12-16 DIAGNOSIS — H43813 Vitreous degeneration, bilateral: Secondary | ICD-10-CM

## 2014-12-16 DIAGNOSIS — E11321 Type 2 diabetes mellitus with mild nonproliferative diabetic retinopathy with macular edema: Secondary | ICD-10-CM | POA: Diagnosis not present

## 2014-12-16 DIAGNOSIS — E11311 Type 2 diabetes mellitus with unspecified diabetic retinopathy with macular edema: Secondary | ICD-10-CM

## 2014-12-16 DIAGNOSIS — E11331 Type 2 diabetes mellitus with moderate nonproliferative diabetic retinopathy with macular edema: Secondary | ICD-10-CM | POA: Diagnosis not present

## 2014-12-16 DIAGNOSIS — H2513 Age-related nuclear cataract, bilateral: Secondary | ICD-10-CM

## 2014-12-16 DIAGNOSIS — I10 Essential (primary) hypertension: Secondary | ICD-10-CM

## 2014-12-16 DIAGNOSIS — H35033 Hypertensive retinopathy, bilateral: Secondary | ICD-10-CM

## 2015-01-22 ENCOUNTER — Other Ambulatory Visit: Payer: Self-pay | Admitting: Internal Medicine

## 2015-01-22 DIAGNOSIS — E049 Nontoxic goiter, unspecified: Secondary | ICD-10-CM

## 2015-01-27 ENCOUNTER — Encounter (INDEPENDENT_AMBULATORY_CARE_PROVIDER_SITE_OTHER): Payer: Medicare Other | Admitting: Ophthalmology

## 2015-01-27 DIAGNOSIS — H35043 Retinal micro-aneurysms, unspecified, bilateral: Secondary | ICD-10-CM | POA: Diagnosis not present

## 2015-01-27 DIAGNOSIS — E11331 Type 2 diabetes mellitus with moderate nonproliferative diabetic retinopathy with macular edema: Secondary | ICD-10-CM

## 2015-01-27 DIAGNOSIS — E11311 Type 2 diabetes mellitus with unspecified diabetic retinopathy with macular edema: Secondary | ICD-10-CM

## 2015-01-27 DIAGNOSIS — H43813 Vitreous degeneration, bilateral: Secondary | ICD-10-CM

## 2015-01-27 DIAGNOSIS — H2513 Age-related nuclear cataract, bilateral: Secondary | ICD-10-CM | POA: Diagnosis not present

## 2015-01-27 DIAGNOSIS — I10 Essential (primary) hypertension: Secondary | ICD-10-CM | POA: Diagnosis not present

## 2015-01-27 DIAGNOSIS — E11321 Type 2 diabetes mellitus with mild nonproliferative diabetic retinopathy with macular edema: Secondary | ICD-10-CM | POA: Diagnosis not present

## 2015-02-19 ENCOUNTER — Emergency Department (HOSPITAL_COMMUNITY): Payer: Medicare Other

## 2015-02-19 ENCOUNTER — Observation Stay (HOSPITAL_COMMUNITY)
Admission: EM | Admit: 2015-02-19 | Discharge: 2015-02-20 | Disposition: A | Discharge status: Home / Self Care | Payer: Medicare Other | Attending: Internal Medicine | Admitting: Internal Medicine

## 2015-02-19 ENCOUNTER — Encounter (HOSPITAL_COMMUNITY): Payer: Self-pay | Admitting: Nurse Practitioner

## 2015-02-19 ENCOUNTER — Other Ambulatory Visit: Payer: Self-pay

## 2015-02-19 DIAGNOSIS — R079 Chest pain, unspecified: Secondary | ICD-10-CM | POA: Insufficient documentation

## 2015-02-19 DIAGNOSIS — R072 Precordial pain: Secondary | ICD-10-CM | POA: Diagnosis not present

## 2015-02-19 DIAGNOSIS — Z79899 Other long term (current) drug therapy: Secondary | ICD-10-CM | POA: Diagnosis not present

## 2015-02-19 DIAGNOSIS — I5032 Chronic diastolic (congestive) heart failure: Secondary | ICD-10-CM | POA: Diagnosis not present

## 2015-02-19 DIAGNOSIS — I959 Hypotension, unspecified: Secondary | ICD-10-CM | POA: Diagnosis present

## 2015-02-19 DIAGNOSIS — R0789 Other chest pain: Secondary | ICD-10-CM | POA: Diagnosis not present

## 2015-02-19 DIAGNOSIS — F419 Anxiety disorder, unspecified: Secondary | ICD-10-CM | POA: Diagnosis present

## 2015-02-19 DIAGNOSIS — R0989 Other specified symptoms and signs involving the circulatory and respiratory systems: Secondary | ICD-10-CM

## 2015-02-19 DIAGNOSIS — E039 Hypothyroidism, unspecified: Secondary | ICD-10-CM | POA: Diagnosis present

## 2015-02-19 DIAGNOSIS — K219 Gastro-esophageal reflux disease without esophagitis: Secondary | ICD-10-CM | POA: Diagnosis present

## 2015-02-19 DIAGNOSIS — E785 Hyperlipidemia, unspecified: Secondary | ICD-10-CM | POA: Diagnosis present

## 2015-02-19 DIAGNOSIS — E119 Type 2 diabetes mellitus without complications: Secondary | ICD-10-CM | POA: Diagnosis not present

## 2015-02-19 DIAGNOSIS — I10 Essential (primary) hypertension: Secondary | ICD-10-CM | POA: Diagnosis not present

## 2015-02-19 HISTORY — DX: Hyperlipidemia, unspecified: E78.5

## 2015-02-19 HISTORY — DX: Type 2 diabetes mellitus without complications: E11.9

## 2015-02-19 HISTORY — DX: Hypothyroidism, unspecified: E03.9

## 2015-02-19 HISTORY — DX: Anxiety disorder, unspecified: F41.9

## 2015-02-19 HISTORY — DX: Gastro-esophageal reflux disease without esophagitis: K21.9

## 2015-02-19 LAB — BASIC METABOLIC PANEL
ANION GAP: 8 (ref 5–15)
BUN: 14 mg/dL (ref 6–20)
CHLORIDE: 102 mmol/L (ref 101–111)
CO2: 29 mmol/L (ref 22–32)
Calcium: 9.3 mg/dL (ref 8.9–10.3)
Creatinine, Ser: 0.62 mg/dL (ref 0.44–1.00)
Glucose, Bld: 121 mg/dL — ABNORMAL HIGH (ref 65–99)
POTASSIUM: 3.8 mmol/L (ref 3.5–5.1)
SODIUM: 139 mmol/L (ref 135–145)

## 2015-02-19 LAB — D-DIMER, QUANTITATIVE: D-Dimer, Quant: 0.44 ug/mL-FEU (ref 0.00–0.48)

## 2015-02-19 LAB — TROPONIN I: Troponin I: <0.03 ng/mL (ref <0.031)

## 2015-02-19 LAB — CBC
HEMATOCRIT: 40.7 % (ref 36.0–46.0)
Hemoglobin: 13.4 g/dL (ref 12.0–15.0)
MCH: 30.8 pg (ref 26.0–34.0)
MCHC: 32.9 g/dL (ref 30.0–36.0)
MCV: 93.6 fL (ref 78.0–100.0)
Platelets: 163 10*3/uL (ref 150–400)
RBC: 4.35 MIL/uL (ref 3.87–5.11)
RDW: 14.2 % (ref 11.5–15.5)
WBC: 5.5 10*3/uL (ref 4.0–10.5)

## 2015-02-19 LAB — TSH: TSH: 2.206 u[IU]/mL (ref 0.350–4.500)

## 2015-02-19 LAB — MAGNESIUM: Magnesium: 1.7 mg/dL (ref 1.7–2.4)

## 2015-02-19 LAB — I-STAT TROPONIN, ED: Troponin i, poc: 0 ng/mL (ref 0.00–0.08)

## 2015-02-19 MED ORDER — ONDANSETRON HCL 4 MG/2ML IJ SOLN: 4.0000 mg | Freq: Four times a day (QID) | INTRAMUSCULAR | Status: DC | PRN

## 2015-02-19 MED ORDER — SODIUM CHLORIDE 0.9 % IJ SOLN: 3.0000 mL | INTRAMUSCULAR | Status: DC | PRN

## 2015-02-19 MED ORDER — BUSPIRONE HCL 15 MG PO TABS
15.0000 mg | ORAL_TABLET | Freq: Every day | ORAL | Status: DC
  Administered 2015-02-19: 15 mg via ORAL
  Filled 2015-02-19 (×3): qty 1

## 2015-02-19 MED ORDER — PANTOPRAZOLE SODIUM 40 MG PO TBEC
40.0000 mg | DELAYED_RELEASE_TABLET | Freq: Every day | ORAL | Status: DC
  Administered 2015-02-19 – 2015-02-20 (×2): 40 mg via ORAL
  Filled 2015-02-19 (×2): qty 1

## 2015-02-19 MED ORDER — DOCUSATE SODIUM 100 MG PO CAPS
100.0000 mg | ORAL_CAPSULE | Freq: Two times a day (BID) | ORAL | Status: DC
  Filled 2015-02-19 (×2): qty 1

## 2015-02-19 MED ORDER — SODIUM CHLORIDE 0.9 % IV SOLN: 250.0000 mL | INTRAVENOUS | Status: DC | PRN

## 2015-02-19 MED ORDER — SIMVASTATIN 40 MG PO TABS
40.0000 mg | ORAL_TABLET | Freq: Every evening | ORAL | Status: DC
  Administered 2015-02-19: 40 mg via ORAL
  Filled 2015-02-19 (×2): qty 1

## 2015-02-19 MED ORDER — ASPIRIN 81 MG PO CHEW: 324.0000 mg | CHEWABLE_TABLET | Freq: Once | ORAL | Status: DC

## 2015-02-19 MED ORDER — ACETAMINOPHEN 325 MG PO TABS
650.0000 mg | ORAL_TABLET | ORAL | Status: DC | PRN
  Administered 2015-02-19: 650 mg via ORAL
  Filled 2015-02-19 (×2): qty 2

## 2015-02-19 MED ORDER — SODIUM CHLORIDE 0.9 % IJ SOLN
3.0000 mL | Freq: Two times a day (BID) | INTRAMUSCULAR | Status: DC
  Administered 2015-02-19: 3 mL via INTRAVENOUS

## 2015-02-19 MED ORDER — AMLODIPINE BESYLATE 5 MG PO TABS
5.0000 mg | ORAL_TABLET | Freq: Every day | ORAL | Status: DC
  Administered 2015-02-19 – 2015-02-20 (×2): 5 mg via ORAL
  Filled 2015-02-19 (×2): qty 1

## 2015-02-19 MED ORDER — LEVOTHYROXINE SODIUM 50 MCG PO TABS
50.0000 ug | ORAL_TABLET | Freq: Every day | ORAL | Status: DC
  Filled 2015-02-19: qty 1

## 2015-02-19 MED ORDER — ASPIRIN EC 81 MG PO TBEC
81.0000 mg | DELAYED_RELEASE_TABLET | Freq: Every day | ORAL | Status: DC
  Administered 2015-02-20: 81 mg via ORAL
  Filled 2015-02-19 (×3): qty 1

## 2015-02-19 MED ORDER — CARVEDILOL 3.125 MG PO TABS
3.1250 mg | ORAL_TABLET | Freq: Two times a day (BID) | ORAL | Status: DC
  Administered 2015-02-19: 3.125 mg via ORAL
  Filled 2015-02-19 (×5): qty 1

## 2015-02-19 MED ORDER — BUSPIRONE HCL 15 MG PO TABS
7.5000 mg | ORAL_TABLET | Freq: Every day | ORAL | Status: DC
  Administered 2015-02-20: 7.5 mg via ORAL
  Filled 2015-02-19: qty 1

## 2015-02-19 MED ORDER — ASPIRIN EC 81 MG PO TBEC: 81.0000 mg | DELAYED_RELEASE_TABLET | Freq: Every day | ORAL | Status: DC

## 2015-02-19 MED ORDER — INSULIN ASPART 100 UNIT/ML ~~LOC~~ SOLN: 0.0000 [IU] | Freq: Three times a day (TID) | SUBCUTANEOUS | Status: DC

## 2015-02-19 MED ORDER — ENOXAPARIN SODIUM 40 MG/0.4ML ~~LOC~~ SOLN
40.0000 mg | SUBCUTANEOUS | Status: DC
  Administered 2015-02-19: 40 mg via SUBCUTANEOUS
  Filled 2015-02-19 (×2): qty 0.4

## 2015-02-19 MED ORDER — BUSPIRONE HCL 15 MG PO TABS: 7.5000 mg | ORAL_TABLET | Freq: Two times a day (BID) | ORAL | Status: DC

## 2015-02-19 MED ORDER — NITROGLYCERIN 0.4 MG SL SUBL: 0.4000 mg | SUBLINGUAL_TABLET | SUBLINGUAL | Status: DC | PRN

## 2015-02-19 NOTE — H&P (Addendum)
[facility] History and Physical  SHUNDRA WIRSING ZOX:[id number] DOB: 02/17/1946 DOA: 02/19/2015  Referring physician: Wynetta Emery, PA PCP: Ginette Otto, MD   Chief Complaint: chest Pain  HPI: Chelsea Roberson is a 69 y.o. female  With history of hypertension, diabetes mellitus, hyperlipidemia, family history of coronary artery disease with father dying from an acute MI at age 28, who presents to the ED with sudden onset of left-sided chest pain radiating to the left upper extremity occurring and 9 AM on the morning of admission which was described as a dull ache constant in nature and occurring when doing light housework. Patient stated that early on in the morning she felt sluggish and fatigued checked her blood pressure and it was 84/60. Patient states she drove to the nearest drug store checked again and was similar and subsequently returned home. Patient called her PCPs office and was instructed to call 911. Patient also took 4 baby aspirin. Patient endorses some shortness of breath, diaphoresis, flushing [place]. Patient denies any palpitations, no wheezing, no fever, no chills, no nausea, no vomiting, no abdominal pain, no diarrhea, no constipation, no dysuria, no visual changes no melanoma no hematemesis no hematochezia. Patient states when 911 got that she was given 4 sublingual nitroglycerin with clinical improvement. Patient was seen in the ED basic metabolic profile was unremarkable. CBC within normal limits. Point-of-care troponins was negative. Chest x-ray is negative for any acute cardiopulmonary disease.Plain films of the right knee was negative. EKG with normal sinus rhythm with some flattening T waves. [facility] were called to admit the patient for further evaluation and management.   Review of Systems: As per history of present illness otherwise negative. Constitutional:  No weight loss, night sweats, Fevers, chills, fatigue.  HEENT:  No headaches,  Difficulty swallowing,Tooth/dental problems,Sore throat,  No sneezing, itching, ear ache, nasal congestion, post nasal drip,  Cardio-vascular:  No chest pain, Orthopnea, PND, swelling in lower extremities, anasarca, dizziness, palpitations  GI:  No heartburn, indigestion, abdominal pain, nausea, vomiting, diarrhea, change in bowel habits, loss of appetite  Resp:  No shortness of breath with exertion or at rest. No excess mucus, no productive cough, No non-productive cough, No coughing up of blood.No change in color of mucus.No wheezing.No chest wall deformity  Skin:  no rash or lesions.  GU:  no dysuria, change in color of urine, no urgency or frequency. No flank pain.  Musculoskeletal:  No joint pain or swelling. No decreased range of motion. No back pain.  Psych:  No change in mood or affect. No depression or anxiety. No memory loss.   Past Medical History  Diagnosis Date  . Hypertension   . Diabetes mellitus without complication   . Hypercholesteremia   . HTN (hypertension) 02/19/2015  . Hyperlipidemia 02/19/2015  . DM (diabetes mellitus), type 2 02/19/2015  . Hypothyroidism 02/19/2015  . GERD (gastroesophageal reflux disease) 02/19/2015  . Anxiety 02/19/2015   History reviewed. No pertinent past surgical history. Social History:  reports that she has never smoked. She does not have any smokeless tobacco history on file. She reports that she does not drink alcohol or use illicit drugs.  Allergies  Allergen Reactions  . Sulfa Antibiotics Nausea Only    Family History  Problem Relation Age of Onset  . Failure to thrive Mother   . Heart attack Father    father deceased at age 79 from acute MI. Patient with 2 brothers with coronary artery disease.  Prior to Admission medications  Medication Sig Start Date End Date Taking? Authorizing Provider  amLODipine (NORVASC) 5 MG tablet Take 5 mg by mouth daily.   Yes Historical Provider, MD  busPIRone (BUSPAR) 15 MG tablet Take 7.5-15 mg by  mouth 2 (two) times daily. Take 0.5 tablet in morning and whole tablet at night   Yes Historical Provider, MD  CALCIUM PO Take 1 tablet by mouth at bedtime.    Yes Historical Provider, MD  docusate sodium (COLACE) 100 MG capsule Take 100 mg by mouth 2 (two) times daily.   Yes Historical Provider, MD  ibuprofen (ADVIL,MOTRIN) 200 MG tablet Take 400 mg by mouth every 6 (six) hours as needed for pain.   Yes Historical Provider, MD  levothyroxine (SYNTHROID, LEVOTHROID) 50 MCG tablet Take 50 mcg by mouth daily before breakfast.   Yes Historical Provider, MD  losartan-hydrochlorothiazide (HYZAAR) 50-12.5 MG per tablet Take 1 tablet by mouth daily.   Yes Historical Provider, MD  metFORMIN (GLUCOPHAGE) 500 MG tablet Take 1,000 mg by mouth 2 (two) times daily with a meal.   Yes Historical Provider, MD  Multiple Vitamin (MULTIVITAMIN WITH MINERALS) TABS Take 1 tablet by mouth daily.   Yes Historical Provider, MD  omeprazole (PRILOSEC) 20 MG capsule Take 20 mg by mouth daily.   Yes Historical Provider, MD  omeprazole (PRILOSEC) 20 MG capsule Take 20 mg by mouth daily.   Yes Historical Provider, MD  pioglitazone (ACTOS) 30 MG tablet Take 30 mg by mouth daily.   Yes Historical Provider, MD  simvastatin (ZOCOR) 40 MG tablet Take 40 mg by mouth every evening.   Yes Historical Provider, MD   Physical Exam: Filed Vitals:   02/19/15 1445 02/19/15 1500 02/19/15 1530 02/19/15 1600  BP: 136/62 132/61 129/63 140/66  Pulse: 80 75 75 79  Temp:      TempSrc:      Resp:  12 10 16   Height:      Weight:      SpO2: 97% 98% 99% 97%    Wt Readings from Last 3 Encounters:  02/19/15 89.812 kg (198 lb)  12/25/12 97.523 kg (215 lb)    General:  Well-developed well-nourished laying on gurney in no acute cardiopulmonary distress. Speaking in full sentences.  Eyes: PERRLA, EOMI, normal lids, irises & conjunctiva ENT: grossly normal hearing, lips & tongue Neck: no LAD, masses or thyromegaly Cardiovascular: RRR, no  m/r/g. No LE edema. Telemetry: SR, no arrhythmias  Respiratory: CTA bilaterally, no w/r/r. Normal respiratory effort. Abdomen: soft, ntnd, positive bowel sounds, no rebound, no guarding Skin: no rash or induration seen on limited exam Musculoskeletal: grossly normal tone BUE/BLE Psychiatric: grossly normal mood and affect, speech fluent and appropriate Neurologic: Alert and oriented 3. Cranial nerves II through XII are grossly intact. No focal deficits.           Labs on Admission:  Basic Metabolic Panel:  Recent Labs Lab 02/19/15 1420  NA 139  K 3.8  CL 102  CO2 29  GLUCOSE 121*  BUN 14  CREATININE 0.62  CALCIUM 9.3   Liver Function Tests: No results for input(s): AST, ALT, ALKPHOS, BILITOT, PROT, ALBUMIN in the last 168 hours. No results for input(s): LIPASE, AMYLASE in the last 168 hours. No results for input(s): AMMONIA in the last 168 hours. CBC:  Recent Labs Lab 02/19/15 1420  WBC 5.5  HGB 13.4  HCT 40.7  MCV 93.6  PLT 163   Cardiac Enzymes: No results for input(s): CKTOTAL, CKMB, CKMBINDEX, TROPONINI in the  last 168 hours.  BNP (last 3 results) No results for input(s): BNP in the last 8760 hours.  ProBNP (last 3 results) No results for input(s): PROBNP in the last 8760 hours.  CBG: No results for input(s): GLUCAP in the last 168 hours.  Radiological Exams on Admission: Dg Chest 2 View  02/19/2015   CLINICAL DATA:  Chest pain with shortness of breath and weakness. Dizziness upon standing.  EXAM: CHEST  2 VIEW  COMPARISON:  12/25/2012.  FINDINGS: The heart size and mediastinal contours are within normal limits. Both lungs are clear. The visualized skeletal structures are unremarkable. Mild degenerative change shoulders and thoracic spine.  IMPRESSION: Negative exam.   Electronically Signed   By: Elsie Stain M.D.   On: 02/19/2015 14:35   Dg Knee Complete 4 Views Right  02/19/2015   CLINICAL DATA:  Fall, pain and swelling and ecchymosis of the right knee   EXAM: RIGHT KNEE - COMPLETE 4+ VIEW  COMPARISON:  None.  FINDINGS: There is no evidence of fracture, dislocation, or joint effusion. There is no evidence of arthropathy or other focal bone abnormality. Soft tissues are unremarkable. Mild tricompartmental spurring. Mild chondrocalcinosis or potentially calcified/ossified loose bodies.  IMPRESSION: Negative.   Electronically Signed   By: Christiana Pellant M.D.   On: 02/19/2015 14:21    EKG: Independently reviewed. Normal sinus rhythm. Some T-wave flattening.  Assessment/Plan Principal Problem:   Chest pain Active Problems:   HTN (hypertension)   Hyperlipidemia   DM (diabetes mellitus), type 2   Hypothyroidism   GERD (gastroesophageal reflux disease)   Anxiety  #1 chest pain Patient presented with left substernal chest pain which she describes as a dull ache radiating to the left upper extremity improving nitroglycerin. Patient with multiple risk factors of hypertension hyperlipidemia diabetes family history of coronary artery disease. Will admit patient to telemetry. Will cycle cardiac enzymes every 6 hours 3. Check a TSH. Check a 2-D echo. Check a fasting lipid panel. Check a d-dimer. Continue home regimen of Norvasc, Zocor. Will start on low-dose beta blocker. Aspirin. Consulted cardiology for further evaluation and management. Patient may need a stress test for further evaluation. Follow.  #2 hypertension Continue home dose Norvasc. Will start low dose beta blocker.  #3 hyperlipidemia Check a fasting lipid panel. Continue home dose statin.  #4 diabetes mellitus type 2 Check a hemoglobin A1c. Hold oral hypoglycemic agents. Place on a sliding scale insulin.  #5 hypothyroidism Check a TSH. Continue home dose Synthroid.  #6 gastroesophageal reflux disease PPI.  #7 anxiety Continue BuSpar.  #8 prophylaxis PPI for GI prophylaxis. Lovenox for DVT prophylaxis.  Code Status: Full DVT Prophylaxis: Lovenox Family Communication: Updated  patient and husband at bedside. Disposition Plan: Admit to telemetry.  Time spent: 65 minutes  THOMPSON,DANIEL M.D. [facility] Pager [phone]

## 2015-02-19 NOTE — ED Provider Notes (Signed)
CSN: [id number]     Arrival date & time 02/19/15  1306 History   First MD Initiated Contact with Patient 02/19/15 1317     Chief Complaint  Patient presents with  . Chest Pain     (Consider location/radiation/quality/duration/timing/severity/associated sxs/prior Treatment) HPI   Blood pressure 136/62, pulse 80, temperature 97.8 F (36.6 C), temperature source Oral, resp. rate 14, height 5\' 4"  (1.626 m), weight 198 lb (89.812 kg), SpO2 97 %.  Chelsea Roberson is a 69 y.o. female past medical history significant for non-insulin-dependent diabetes, hypertension, hyperlipidemia, former smoker complaining of left-sided ductal chest pain radiating to left arm rated at 7 out of 10 at worse associated with general fatigue and shortness of breath onset at 9 AM when she was folding close. Pain persisted, was not alleviated with rest. She called her primary care doctor who instructed her to take full dose aspirin, she called 911 and was given 4 sublingual nitroglycerin with essential relief of her pain. Patient states that she's had a pain like this similarly in the past but was not this severe and did not last this long. She has no prior cardiac history, has never been evaluated by a cardiologist or had stress test. Patient denies nausea, diaphoresis, lightheaded sensation, syncope, history of DVT or PE, calf pain, leg swelling. Patient has extensive cardiac family history, father died at 93 from MI.    Past Medical History  Diagnosis Date  . Hypertension   . Diabetes mellitus without complication   . Hypercholesteremia    History reviewed. No pertinent past surgical history. No family history on file. Social History  Substance Use Topics  . Smoking status: Never Smoker   . Smokeless tobacco: None  . Alcohol Use: No   OB History    No data available     Review of Systems  10 systems reviewed and found to be negative, except as noted in the HPI.    Allergies  Sulfa antibiotics  Home  Medications   Prior to Admission medications   Medication Sig Start Date End Date Taking? Authorizing Provider  amLODipine (NORVASC) 5 MG tablet Take 5 mg by mouth daily.   Yes Historical Provider, MD  busPIRone (BUSPAR) 15 MG tablet Take 7.5-15 mg by mouth 2 (two) times daily. Take 0.5 tablet in morning and whole tablet at night   Yes Historical Provider, MD  CALCIUM PO Take 1 tablet by mouth at bedtime.    Yes Historical Provider, MD  docusate sodium (COLACE) 100 MG capsule Take 100 mg by mouth 2 (two) times daily.   Yes Historical Provider, MD  ibuprofen (ADVIL,MOTRIN) 200 MG tablet Take 400 mg by mouth every 6 (six) hours as needed for pain.   Yes Historical Provider, MD  levothyroxine (SYNTHROID, LEVOTHROID) 50 MCG tablet Take 50 mcg by mouth daily before breakfast.   Yes Historical Provider, MD  losartan-hydrochlorothiazide (HYZAAR) 50-12.5 MG per tablet Take 1 tablet by mouth daily.   Yes Historical Provider, MD  metFORMIN (GLUCOPHAGE) 500 MG tablet Take 1,000 mg by mouth 2 (two) times daily with a meal.   Yes Historical Provider, MD  Multiple Vitamin (MULTIVITAMIN WITH MINERALS) TABS Take 1 tablet by mouth daily.   Yes Historical Provider, MD  omeprazole (PRILOSEC) 20 MG capsule Take 20 mg by mouth daily.   Yes Historical Provider, MD  omeprazole (PRILOSEC) 20 MG capsule Take 20 mg by mouth daily.   Yes Historical Provider, MD  pioglitazone (ACTOS) 30 MG tablet Take 30 mg  by mouth daily.   Yes Historical Provider, MD  simvastatin (ZOCOR) 40 MG tablet Take 40 mg by mouth every evening.   Yes Historical Provider, MD   BP 110/54 mmHg  Pulse 71  Temp(Src) 97.8 F (36.6 C) (Oral)  Resp 14  Ht  (1.626 m)  Wt 198 lb (89.812 kg)  BMI 33.97 kg/m2  SpO2 98% Physical Exam  Constitutional: She is oriented to person, place, and time. She appears well-developed and well-nourished. No distress.  Obese  HENT:  Head: Normocephalic.  Mouth/Throat: Oropharynx is clear and moist.  Eyes:  Conjunctivae are normal.  Neck: Normal range of motion. No JVD present. No tracheal deviation present.  Cardiovascular: Normal rate, regular rhythm and intact distal pulses.   Radial pulse equal bilaterally  Pulmonary/Chest: Effort normal and breath sounds normal. No stridor. No respiratory distress. She has no wheezes. She has no rales. She exhibits no tenderness.  Abdominal: Soft. She exhibits no distension and no mass. There is no tenderness. There is no rebound and no guarding.  Musculoskeletal: Normal range of motion. She exhibits no edema or tenderness.  No calf asymmetry, superficial collaterals, palpable cords, edema, Homans sign negative bilaterally.    Neurological: She is alert and oriented to person, place, and time.  Skin: Skin is warm. She is not diaphoretic.  Psychiatric: She has a normal mood and affect.  Nursing note and vitals reviewed.   ED Course  Procedures (including critical care time) Labs Review Labs Reviewed  BASIC METABOLIC PANEL - Abnormal; Notable for the following:    Glucose, Bld 121 (*)    All other components within normal limits  CBC  I-STAT TROPOININ, ED    Imaging Review Dg Chest 2 View  02/19/2015   CLINICAL DATA:  Chest pain with shortness of breath and weakness. Dizziness upon standing.  EXAM: CHEST  2 VIEW  COMPARISON:  12/25/2012.  FINDINGS: The heart size and mediastinal contours are within normal limits. Both lungs are clear. The visualized skeletal structures are unremarkable. Mild degenerative change shoulders and thoracic spine.  IMPRESSION: Negative exam.   Electronically Signed   By: Elsie Stain M.D.   On: 02/19/2015 14:35   Dg Knee Complete 4 Views Right  02/19/2015   CLINICAL DATA:  Fall, pain and swelling and ecchymosis of the right knee  EXAM: RIGHT KNEE - COMPLETE 4+ VIEW  COMPARISON:  None.  FINDINGS: There is no evidence of fracture, dislocation, or joint effusion. There is no evidence of arthropathy or other focal bone  abnormality. Soft tissues are unremarkable. Mild tricompartmental spurring. Mild chondrocalcinosis or potentially calcified/ossified loose bodies.  IMPRESSION: Negative.   Electronically Signed   By: Christiana Pellant M.D.   On: 02/19/2015 14:21   I have personally reviewed and evaluated these images and lab results as part of my medical decision-making.   EKG Interpretation None      MDM   Final diagnoses:  Chest pain, unspecified chest pain type    Filed Vitals:   02/19/15 1314 02/19/15 1345 02/19/15 1445 02/19/15 1500  BP: 141/64 110/54 136/62 132/61  Pulse: 80 71 80 75  Temp: 97.8 F (36.6 C)     TempSrc: Oral     Resp: Height:  (1.626 m)     Weight: 198 lb (89.812 kg)     SpO2: 98% 98% 97% 98%    Chelsea Roberson is a pleasant 69 y.o. female presenting with sided chest pressure radiating to  left arm onset this morning at 9 AM associated with shortness of breath and generalized fatigue. She had full dose aspirin as instructed by her PCP on the phone. She was given 4 sublingual nitroglycerin with essential resolution of her chest pain. Patient's EKG is nonischemic, troponin is negative and chest x-ray unremarkable. Patient is moderate risk by heart score and will need a chest pain rule out.  Case discussed with Dr. Janee Morn who accepts admission    [facility], PA-C 02/19/15 1527  Lyndal Pulley, MD 02/20/15 [phone]

## 2015-02-19 NOTE — ED Notes (Signed)
Admitting at bedside 

## 2015-02-19 NOTE — ED Notes (Signed)
Attempted phlebotomy stick without success 

## 2015-02-19 NOTE — Consult Note (Signed)
Cardiology Consultation Note  Patient ID: Chelsea Roberson, MRN: [id number], DOB/AGE: 1946/02/25 69 y.o. Admit date: 02/19/2015   Date of Consult: 02/19/2015 Primary Physician: Ginette Otto, MD Primary Cardiologist: New  Chief Complaint: fatigue, dyspnea, chest pain Reason for Consultation: chest pain  HPI: Chelsea Roberson is a 69 y/o F with history of HTN, HLD, DM, hypothyroidism, GERD, anxiety, and no prior cardiac history who presented to [facility] with fatigue, dyspnea, and chest pain. She has a history of intermittent chest discomfort about twice a month, with no rhyme or reason to the pattern. She has not been particularly concerned about this and felt maybe it was like the symptoms her husband has had in the past with his hiatal hernia. She takes omeprazole nightly for GERD.   This morning when she woke up she felt very fatigued and somewhat short of breath. She recalls in the past when this has happened, her blood pressure has run low. She checked it and it was 82/60. She decided to take her cuff to the drug store where they told her her blood pressure cuff corresponded to theirs. She then drove him. (In retrospect she realizes that driving herself was not a safe decision.) While driving home she began to notice a "hard pain" in the top of her chest similar to prior chest pains above, except this time it began to radiate to her left arm. It got worse when she got home so she called PCP who recommended she call 911. Pain was not worse with inspiration, palpation or movement. She took 4 baby ASA per dispatch instructions. When EMS arrived they gave her SL NTG which she feels over a gradual period of time slowly relieved her discomfort. She was still having discomfort when she got to the ER but this has since completely resolved. Initial VS 141/64, P80, Pulse ox 98%, telemetry unremarkable. Labwork benign except glucose 121 - Troponin neg x 1. Only other + ROS is that she fell 3 days ago  hitting her knee but knee films were negative. No recent surgery, travel, bedrest, LEE, orthopnea or palpitations. She has lost about 45 lbs over a span of time intentionally in hopes to help with diabetes. She says in general her medical problems are well-controlled. EKG show NSR with low voltage QRS and nonspecific ST sagging in V4-V6 but generally nonacute and unchanged from prior. Father had CAD diagnosed in his 53s and passed of MI at 16; brother had CAD in his 67s.   Past Medical History  Diagnosis Date  . Hypertension   . Hyperlipidemia   . DM (diabetes mellitus), type 2   . Hypothyroidism   . GERD (gastroesophageal reflux disease)   . Anxiety       Most Recent Cardiac Studies: None   Surgical History:  Past Surgical History  Procedure Laterality Date  . Partial hysterectomy       Home Meds: Prior to Admission medications   Medication Sig Start Date End Date Taking? Authorizing Provider  amLODipine (NORVASC) 5 MG tablet Take 5 mg by mouth daily.   Yes Historical Provider, MD  busPIRone (BUSPAR) 15 MG tablet Take 7.5-15 mg by mouth 2 (two) times daily. Take 0.5 tablet in morning and whole tablet at night   Yes Historical Provider, MD  CALCIUM PO Take 1 tablet by mouth at bedtime.    Yes Historical Provider, MD  docusate sodium (COLACE) 100 MG capsule Take 100 mg by mouth 2 (two) times daily.   Yes  Historical Provider, MD  ibuprofen (ADVIL,MOTRIN) 200 MG tablet Take 400 mg by mouth every 6 (six) hours as needed for pain.   Yes Historical Provider, MD  levothyroxine (SYNTHROID, LEVOTHROID) 50 MCG tablet Take 50 mcg by mouth daily before breakfast.   Yes Historical Provider, MD  losartan-hydrochlorothiazide (HYZAAR) 50-12.5 MG per tablet Take 1 tablet by mouth daily.   Yes Historical Provider, MD  metFORMIN (GLUCOPHAGE) 500 MG tablet Take 1,000 mg by mouth 2 (two) times daily with a meal.   Yes Historical Provider, MD  Multiple Vitamin (MULTIVITAMIN WITH MINERALS) TABS Take 1  tablet by mouth daily.   Yes Historical Provider, MD  omeprazole (PRILOSEC) 20 MG capsule Take 20 mg by mouth daily.   Yes Historical Provider, MD  omeprazole (PRILOSEC) 20 MG capsule Take 20 mg by mouth daily.   Yes Historical Provider, MD  pioglitazone (ACTOS) 30 MG tablet Take 30 mg by mouth daily.   Yes Historical Provider, MD  simvastatin (ZOCOR) 40 MG tablet Take 40 mg by mouth every evening.   Yes Historical Provider, MD    Inpatient Medications:  . [START ON 02/20/2015] aspirin EC  81 mg Oral Daily  . carvedilol  3.125 mg Oral BID WC  . enoxaparin (LOVENOX) injection  40 mg Subcutaneous Q24H  . [START ON 02/20/2015] insulin aspart  0-15 Units Subcutaneous TID WC  . sodium chloride  3 mL Intravenous Q12H   . sodium chloride      Allergies:  Allergies  Allergen Reactions  . Sulfa Antibiotics Nausea Only    Social History   Social History  . Marital Status: Married    Spouse Name: N/A  . Number of Children: N/A  . Years of Education: N/A   Occupational History  . Retired Programmer, [employer] a [employer]     Social History Main Topics  . Smoking status: Never Smoker   . Smokeless tobacco: Not on file  . Alcohol Use: No  . Drug Use: No  . Sexual Activity: Not on file   Other Topics Concern  . Not on file   Social History Narrative     Family History  Problem Relation Age of Onset  . Failure to thrive Mother   . CAD Father     CAD diagnosed around 62, passed away at 8 of MI  . CAD Brother     Died in his 53s of MI, had had CABG about 2 years before he died     Review of Systems: No hx of bleeding. No history of stroke/TIA. All other systems reviewed and are otherwise negative except as noted above.  Labs:  Lab Results  Component Value Date   WBC 5.5 02/19/2015   HGB 13.4 02/19/2015   HCT 40.7 02/19/2015   MCV 93.6 02/19/2015   PLT 163 02/19/2015    Recent Labs Lab 02/19/15 1420  NA 139  K 3.8  CL 102  CO2 29  BUN 14  CREATININE 0.62  CALCIUM  9.3  GLUCOSE 121*   No results found for: CHOL, HDL, LDLCALC, TRIG Lab Results  Component Value Date   DDIMER 0.30 12/25/2012    Radiology/Studies:  Dg Chest 2 View  02/19/2015   CLINICAL DATA:  Chest pain with shortness of breath and weakness. Dizziness upon standing.  EXAM: CHEST  2 VIEW  COMPARISON:  12/25/2012.  FINDINGS: The heart size and mediastinal contours are within normal limits. Both lungs are clear. The visualized skeletal structures are unremarkable. Mild degenerative change  shoulders and thoracic spine.  IMPRESSION: Negative exam.   Electronically Signed   By: Elsie Stain M.D.   On: 02/19/2015 14:35   Dg Knee Complete 4 Views Right  02/19/2015   CLINICAL DATA:  Fall, pain and swelling and ecchymosis of the right knee  EXAM: RIGHT KNEE - COMPLETE 4+ VIEW  COMPARISON:  None.  FINDINGS: There is no evidence of fracture, dislocation, or joint effusion. There is no evidence of arthropathy or other focal bone abnormality. Soft tissues are unremarkable. Mild tricompartmental spurring. Mild chondrocalcinosis or potentially calcified/ossified loose bodies.  IMPRESSION: Negative.   Electronically Signed   By: Christiana Pellant M.D.   On: 02/19/2015 14:21    Wt Readings from Last 3 Encounters:  02/19/15 198 lb (89.812 kg)  12/25/12 215 lb (97.523 kg)    EKG: NSR 85bpm, low voltage QRS, nonspecific ST sagging in V4-V6 but not really changed from prior  Physical Exam: Blood pressure 151/64, pulse 79, temperature 97.8 F (36.6 C), temperature source Oral, resp. rate 13, height 5\' 4"  (1.626 m), weight 198 lb (89.812 kg), SpO2 99 %. General: Well developed, well nourished, in no acute distress. Head: Normocephalic, atraumatic, sclera non-icteric, no xanthomas, nares are without discharge.  Neck: Negative for carotid bruits. JVD not elevated. Lungs: Clear bilaterally to auscultation without wheezes, rales, or rhonchi. Breathing is unlabored. Heart: RRR with S1 S2. No murmurs, rubs, or  gallops appreciated. Abdomen: Soft, non-tender, non-distended with normoactive bowel sounds. No hepatomegaly. No rebound/guarding. No obvious abdominal masses. Msk:  Strength and tone appear normal for age. Extremities: No clubbing or cyanosis. No edema.  Distal pedal pulses are 2+ and equal bilaterally. Neuro: Alert and oriented X 3. No facial asymmetry. No focal deficit. Moves all extremities spontaneously. Psych:  Responds to questions appropriately with a normal affect.    Assessment and Plan:   1. Chest pain - both typical and atypical features. Troponin negative despite 3 hours of pain thus far, but will need to be cycled to complete a full rule out. Continue aspirin. If she rules out, will have her seen in the morning to see how she is doing and decide inpatient vs outpatient stress test. I sent a message to the weekend PA letting her know to try to see patient earlier tomorrow so we can get a disposition. Will discuss BP discrepancy issue with MD.  2. Essential HTN - controlled in the ER. Difficult to know what happened this morning with BPs in the 80s/60s range per home cuff. This was apparently corroborated by the drug store she went to. Will need to follow BP closely while admitted. Note she was still having symptoms in the ER when she arrived and vitals were stable.  3. Hyperlipidemia - lipid panel pending for AM.  4. Hypothyroidism - TSH in process.  Thomasene Mohair PA-C 02/19/2015, 5:25 PM Pager: [phone]

## 2015-02-19 NOTE — ED Notes (Signed)
Cp x 3 hours, radiates to left arm, 6/10, ems gave 4 ntg sl, pain decreased to 4/10.  Hx of same, radiation to left arm is new and scared the patient causing her to call 911.  VS WNL. Hx of type II dm, shortness of breath for same length of time.  12 lead unremarkable.  Fell 3 days ago, pain in right knee.

## 2015-02-19 NOTE — ED Notes (Signed)
Cardiology at bedside.

## 2015-02-20 ENCOUNTER — Inpatient Hospital Stay (HOSPITAL_COMMUNITY): Payer: Medicare Other

## 2015-02-20 ENCOUNTER — Inpatient Hospital Stay (HOSPITAL_BASED_OUTPATIENT_CLINIC_OR_DEPARTMENT_OTHER): Payer: Medicare Other

## 2015-02-20 DIAGNOSIS — R079 Chest pain, unspecified: Secondary | ICD-10-CM

## 2015-02-20 DIAGNOSIS — R0789 Other chest pain: Secondary | ICD-10-CM | POA: Diagnosis not present

## 2015-02-20 DIAGNOSIS — K219 Gastro-esophageal reflux disease without esophagitis: Secondary | ICD-10-CM | POA: Diagnosis not present

## 2015-02-20 DIAGNOSIS — E785 Hyperlipidemia, unspecified: Secondary | ICD-10-CM | POA: Diagnosis not present

## 2015-02-20 DIAGNOSIS — E119 Type 2 diabetes mellitus without complications: Secondary | ICD-10-CM | POA: Diagnosis not present

## 2015-02-20 DIAGNOSIS — I959 Hypotension, unspecified: Secondary | ICD-10-CM

## 2015-02-20 DIAGNOSIS — E039 Hypothyroidism, unspecified: Secondary | ICD-10-CM

## 2015-02-20 DIAGNOSIS — F419 Anxiety disorder, unspecified: Secondary | ICD-10-CM | POA: Diagnosis not present

## 2015-02-20 DIAGNOSIS — I10 Essential (primary) hypertension: Secondary | ICD-10-CM | POA: Diagnosis not present

## 2015-02-20 LAB — LIPID PANEL
CHOL/HDL RATIO: 3.3 ratio
Cholesterol: 176 mg/dL (ref 0–200)
HDL: 53 mg/dL (ref >40)
LDL CALC: 101 mg/dL — AB (ref 0–99)
Triglycerides: 109 mg/dL (ref <150)
VLDL: 22 mg/dL (ref 0–40)

## 2015-02-20 LAB — CBC
HEMATOCRIT: 40.3 % (ref 36.0–46.0)
Hemoglobin: 13.6 g/dL (ref 12.0–15.0)
MCH: 31.6 pg (ref 26.0–34.0)
MCHC: 33.7 g/dL (ref 30.0–36.0)
MCV: 93.7 fL (ref 78.0–100.0)
Platelets: 143 10*3/uL — ABNORMAL LOW (ref 150–400)
RBC: 4.3 MIL/uL (ref 3.87–5.11)
RDW: 14.3 % (ref 11.5–15.5)
WBC: 3.9 10*3/uL — AB (ref 4.0–10.5)

## 2015-02-20 LAB — GLUCOSE, CAPILLARY
GLUCOSE-CAPILLARY: 156 mg/dL — AB (ref 65–99)
Glucose-Capillary: 164 mg/dL — ABNORMAL HIGH (ref 65–99)
Glucose-Capillary: 201 mg/dL — ABNORMAL HIGH (ref 65–99)

## 2015-02-20 LAB — HEMOGLOBIN A1C
Hgb A1c MFr Bld: 6.9 % — ABNORMAL HIGH (ref 4.8–5.6)
Mean Plasma Glucose: 151 mg/dL

## 2015-02-20 LAB — BASIC METABOLIC PANEL
Anion gap: 8 (ref 5–15)
BUN: 9 mg/dL (ref 6–20)
CHLORIDE: 103 mmol/L (ref 101–111)
CO2: 28 mmol/L (ref 22–32)
Calcium: 9.1 mg/dL (ref 8.9–10.3)
Creatinine, Ser: 0.5 mg/dL (ref 0.44–1.00)
GFR calc non Af Amer: >60 mL/min (ref >60)
Glucose, Bld: 117 mg/dL — ABNORMAL HIGH (ref 65–99)
POTASSIUM: 3 mmol/L — AB (ref 3.5–5.1)
SODIUM: 139 mmol/L (ref 135–145)

## 2015-02-20 LAB — PROTIME-INR
INR: 1.13 (ref 0.00–1.49)
PROTHROMBIN TIME: 14.7 s (ref 11.6–15.2)

## 2015-02-20 LAB — TROPONIN I: Troponin I: <0.03 ng/mL (ref <0.031)

## 2015-02-20 MED ORDER — TECHNETIUM TC 99M SESTAMIBI GENERIC - CARDIOLITE
30.0000 | Freq: Once | INTRAVENOUS | Status: AC | PRN
  Administered 2015-02-20: 30 via INTRAVENOUS

## 2015-02-20 MED ORDER — REGADENOSON 0.4 MG/5ML IV SOLN
INTRAVENOUS | Status: AC
  Filled 2015-02-20: qty 5

## 2015-02-20 MED ORDER — POTASSIUM CHLORIDE ER 20 MEQ PO TBCR: 40.0000 meq | EXTENDED_RELEASE_TABLET | Freq: Two times a day (BID) | ORAL | Status: DC

## 2015-02-20 MED ORDER — TECHNETIUM TC 99M SESTAMIBI GENERIC - CARDIOLITE
10.8000 | Freq: Once | INTRAVENOUS | Status: AC | PRN
  Administered 2015-02-20: 11 via INTRAVENOUS

## 2015-02-20 MED ORDER — CARVEDILOL 3.125 MG PO TABS: 3.1250 mg | ORAL_TABLET | Freq: Two times a day (BID) | ORAL | Status: AC

## 2015-02-20 MED ORDER — POTASSIUM CHLORIDE CRYS ER 20 MEQ PO TBCR
40.0000 meq | EXTENDED_RELEASE_TABLET | Freq: Once | ORAL | Status: AC
  Administered 2015-02-20: 40 meq via ORAL
  Filled 2015-02-20: qty 2

## 2015-02-20 MED ORDER — OMEPRAZOLE 20 MG PO CPDR: 20.0000 mg | DELAYED_RELEASE_CAPSULE | Freq: Two times a day (BID) | ORAL | Status: DC

## 2015-02-20 MED ORDER — ASPIRIN 81 MG PO TBEC: 81.0000 mg | DELAYED_RELEASE_TABLET | Freq: Every day | ORAL | Status: DC

## 2015-02-20 MED ORDER — REGADENOSON 0.4 MG/5ML IV SOLN
0.4000 mg | Freq: Once | INTRAVENOUS | Status: AC
  Administered 2015-02-20: 0.4 mg via INTRAVENOUS

## 2015-02-20 MED ORDER — ATORVASTATIN CALCIUM 20 MG PO TABS: 20.0000 mg | ORAL_TABLET | Freq: Every day | ORAL | Status: DC

## 2015-02-20 NOTE — Progress Notes (Addendum)
Patient Name: Chelsea Roberson Date of Encounter: 02/20/2015     Principal Problem:   Chest pain Active Problems:   HTN (hypertension)   Hyperlipidemia   DM (diabetes mellitus), type 2   Hypothyroidism   GERD (gastroesophageal reflux disease)   Anxiety    SUBJECTIVE  Seen in nuclear stress lab for lexiscan myoview. No further chest pain. She tolerated the procedure well.   CURRENT MEDS . amLODipine  5 mg Oral Daily  . aspirin EC  81 mg Oral Daily  . busPIRone  15 mg Oral QHS  . busPIRone  7.5 mg Oral Daily  . carvedilol  3.125 mg Oral BID WC  . docusate sodium  100 mg Oral BID  . enoxaparin (LOVENOX) injection  40 mg Subcutaneous Q24H  . insulin aspart  0-15 Units Subcutaneous TID WC  . levothyroxine  50 mcg Oral QAC breakfast  . pantoprazole  40 mg Oral Daily  . regadenoson      . simvastatin  40 mg Oral QPM  . sodium chloride  3 mL Intravenous Q12H    OBJECTIVE  Filed Vitals:   02/19/15 1858 02/19/15 2033 02/20/15 0345 02/20/15 0900  BP: 145/59 146/60 137/60 153/85  Pulse: 69 73 65 74  Temp: 98.2 F (36.8 C)  97.9 F (36.6 C)   TempSrc: Oral  Oral   Resp: 18 18 16    Height: 5\' 4"  (1.626 m)     Weight: 198 lb (89.812 kg)  196 lb 6.9 oz (89.1 kg)   SpO2: 98% 96% 95%     Intake/Output Summary (Last 24 hours) at 02/20/15 0925 Last data filed at 02/20/15 0730  Gross per 24 hour  Intake    240 ml  Output      0 ml  Net    240 ml   Filed Weights   02/19/15 1314 02/19/15 1858 02/20/15 0345  Weight: 198 lb (89.812 kg) 198 lb (89.812 kg) 196 lb 6.9 oz (89.1 kg)    PHYSICAL EXAM  General: Pleasant, NAD. Neuro: Alert and oriented X 3. Moves all extremities spontaneously. Psych: Normal affect. HEENT:  Normal  Neck: Supple without bruits or JVD. Lungs:  Resp regular and unlabored, CTA. Heart: RRR no s3, s4, or murmurs. Abdomen: Soft, non-tender, non-distended, BS + x 4.  Extremities: No clubbing, cyanosis or edema. DP/PT/Radials 2+ and equal  bilaterally.  Accessory Clinical Findings  CBC  Recent Labs  02/19/15 1420 02/20/15 0447  WBC 5.5 3.9*  HGB 13.4 13.6  HCT 40.7 40.3  MCV 93.6 93.7  PLT 163 143*   Basic Metabolic Panel  Recent Labs  02/19/15 1420 02/19/15 1657 02/20/15 0447  NA 139  --  139  K 3.8  --  3.0*  CL 102  --  103  CO2 29  --  28  GLUCOSE 121*  --  117*  BUN 14  --  9  CREATININE 0.62  --  0.50  CALCIUM 9.3  --  9.1  MG  --  1.7  --    Liver Function Tests No results for input(s): AST, ALT, ALKPHOS, BILITOT, PROT, ALBUMIN in the last 72 hours. No results for input(s): LIPASE, AMYLASE in the last 72 hours. Cardiac Enzymes  Recent Labs  02/19/15 1657 02/19/15 2222 02/20/15 0447  TROPONINI <0.03 <0.03 <0.03   BNP Invalid input(s): POCBNP D-Dimer  Recent Labs  02/19/15 1657  DDIMER 0.44   Hemoglobin A1C  Recent Labs  02/19/15 1657  HGBA1C 6.9*  Fasting Lipid Panel  Recent Labs  02/20/15 0447  CHOL 176  HDL 53  LDLCALC 101*  TRIG 109  CHOLHDL 3.3   Thyroid Function Tests  Recent Labs  02/19/15 1657  TSH 2.206    TELE  NSR  Radiology/Studies  Dg Chest 2 View  02/19/2015   CLINICAL DATA:  Chest pain with shortness of breath and weakness. Dizziness upon standing.  EXAM: CHEST  2 VIEW  COMPARISON:  12/25/2012.  FINDINGS: The heart size and mediastinal contours are within normal limits. Both lungs are clear. The visualized skeletal structures are unremarkable. Mild degenerative change shoulders and thoracic spine.  IMPRESSION: Negative exam.   Electronically Signed   By: Elsie Stain M.D.   On: 02/19/2015 14:35   Dg Knee Complete 4 Views Right  02/19/2015   CLINICAL DATA:  Fall, pain and swelling and ecchymosis of the right knee  EXAM: RIGHT KNEE - COMPLETE 4+ VIEW  COMPARISON:  None.  FINDINGS: There is no evidence of fracture, dislocation, or joint effusion. There is no evidence of arthropathy or other focal bone abnormality. Soft tissues are unremarkable.  Mild tricompartmental spurring. Mild chondrocalcinosis or potentially calcified/ossified loose bodies.  IMPRESSION: Negative.   Electronically Signed   By: Christiana Pellant M.D.   On: 02/19/2015 14:21    ASSESSMENT AND PLAN Chelsea Roberson is a 69 y/o F with history of HTN, HLD, DM, hypothyroidism, GERD, anxiety, and no prior cardiac history who presented to [facility] with fatigue, dyspnea, and chest pain  Chest pain - both typical and atypical features. -- Troponin negative x3. ECG with no acute ST or TW changes.  -- DDimer negatve -- Nuclear stress test today. Pending images.   2. Essential HTN - better controlled currently. She presented with hypotension. This has now resolved. Continue to monitor   3. Hyperlipidemia - LDL 101.   4. Hypothyroidism - TSH 2.06. Continue synthroid  5. DM- HgA1c 6.9. Per IM  6. Hypokalemia- K 3.0 . Will supplement now  7. Unequal BP- check carotid and subclavian Dopplers to rule out any subclavian stenosis that could be a cause of her unequal blood pressures.  Billy Fischer PA-C  Pager [phone] The patient has had her Myoview stress test, results pending.  She feels well.  She has had no further chest pain.  Her carotid Dopplers are negative on preliminary reading. On exam her lungs are clear.  Cardiac exam is unremarkable.  Telemetry shows normal sinus rhythm. Anticipate possible discharge later today if Myoview is normal.  Follow-up with Dr. Rennis Golden for cardiology issues.

## 2015-02-20 NOTE — Discharge Summary (Signed)
Physician Discharge Summary  Chelsea Roberson:[id number] DOB: 1946-03-23 DOA: 02/19/2015  PCP: Ginette Otto, MD  Admit date: 02/19/2015 Discharge date: 02/20/2015  Time spent: 30 minutes  Recommendations for Outpatient Follow-up:  1. Reassess BP and adjust antihypertensive agents as needed 2. Please repeat BMET to follow electrolytes and renal function   Discharge Diagnoses:  Principal Problem:   Chest pain Active Problems:   HTN (hypertension)   Hyperlipidemia   DM (diabetes mellitus), type 2   Hypothyroidism   GERD (gastroesophageal reflux disease)   Anxiety chronic diastolic heart failure   Discharge Condition: stable and improved. Chest pain free. Will discharge home and she will follow up with PCP and cardiology as an outpatient.  Diet recommendation: low carbohydrates and low sodium diet  Filed Weights   02/19/15 1314 02/19/15 1858 02/20/15 0345  Weight: 89.812 kg (198 lb) 89.812 kg (198 lb) 89.1 kg (196 lb 6.9 oz)    History of present illness:  69 y.o. female With history of hypertension, diabetes mellitus, hyperlipidemia, family history of coronary artery disease with father dying from an acute MI at age 59, who presents to the ED with sudden onset of left-sided chest pain radiating to the left upper extremity occurring and 9 AM on the morning of admission which was described as a dull ache constant in nature and occurring when doing light housework. Patient stated that early on in the morning she felt sluggish and fatigued checked her blood pressure and it was 84/60. Patient states she drove to the nearest drug store checked again and was similar and subsequently returned home. Patient called her PCPs office and was instructed to call 911. Patient also took 4 baby aspirin. Patient endorses some shortness of breath, diaphoresis, flushing [place]. Patient denies any palpitations, no wheezing, no fever, no chills, no nausea, no vomiting, no abdominal pain, no diarrhea, no  constipation, no dysuria, no visual changes no melanoma no hematemesis no hematochezia. Patient states when 911 got that she was given 4 sublingual nitroglycerin with clinical improvement.  Patient was seen in the ED basic metabolic profile was unremarkable. CBC within normal limits. Point-of-care troponins was negative. Chest x-ray is negative for any acute cardiopulmonary disease.  Hospital Course:  #1 chest pain -Patient presented with left substernal chest pain which she describes as a dull ache radiating to the left upper extremity, improving with nitroglycerin. Patient with multiple risk factors of hypertension, hyperlipidemia, diabetes, family history of coronary artery disease. -troponin negative X 3 -low risk probability for ischemia on Myoview -2-D echo w/o wall motion abnormalities; grade 1 diastolic heart failure -outpatient follow up with cardiology service (Dr. Rennis Golden) -will discharge on ASAa nd low dose coreg  #2 hypertension -Started on coreg BID -will continue norvasc as well -advise to follow low sodium diet  #3 hyperlipidemia Continue statins -LDL 101  #4 diabetes mellitus type 2 -Will resume home hypoglycemic regimen -Advise to follow low carb diet -A1C 6.9  #5 hypothyroidism -Continue home dose Synthroid.  #6 gastroesophageal reflux disease -Will continue PPI, but will change to BID for better control  #7 chronic diastolic heart failure -Compensated -Preserved EF  -will add low dose coreg to control BP better -advise to check weight on daily basis and follow low sodium diet   #8 anxiety -Continue BuSpar.  Procedures:  Myoview : 1. No reversible ischemia or infarction. 2. Normal left ventricular wall motion. 3. Left ventricular ejection fraction 68% 4. Low-risk stress test findings   2-D echo - Left ventricle: The  cavity size was normal. Wall thickness was increased in a pattern of mild LVH. Systolic function was vigorous. The estimated  ejection fraction was in the range of 65% to 70%. Wall motion was normal; there were no regional wall motion abnormalities. Doppler parameters are consistent with abnormal left ventricular relaxation (grade 1 diastolic dysfunction). - Aortic valve: There was mild regurgitation.  Consultations:  Cardiology   Discharge Exam: Filed Vitals:   02/20/15 1418  BP: 136/54  Pulse: 84  Temp: 97.8 F (36.6 C)  Resp: 18    General: afebrile, no CP, no SOB Cardiovascular: S1 and S2, no rubs or gallops Respiratory: CTA bilaterally Abd: soft, NT, ND, positive BS Extremities: no edema or cyanosis   Discharge Instructions   Discharge Instructions    Diet - low sodium heart healthy    Complete by:  As directed      Discharge instructions    Complete by:  As directed   Follow heart healthy diet Take medications as prescribed Please arrange follow up with PCP in 2 weeks Follow up with cardiology service (they will contact you with appointment details) Maintain adequate hydration          Current Discharge Medication List    START taking these medications   Details  aspirin EC 81 MG EC tablet Take 1 tablet (81 mg total) by mouth daily.    carvedilol (COREG) 3.125 MG tablet Take 1 tablet (3.125 mg total) by mouth 2 (two) times daily with a meal. Qty: 60 tablet, Refills: 2    potassium chloride 20 MEQ TBCR Take 40 mEq by mouth 2 (two) times daily. Qty: 8 tablet, Refills: 0      CONTINUE these medications which have CHANGED   Details  omeprazole (PRILOSEC) 20 MG capsule Take 1 capsule (20 mg total) by mouth 2 (two) times daily before a meal.      CONTINUE these medications which have NOT CHANGED   Details  amLODipine (NORVASC) 5 MG tablet Take 5 mg by mouth daily.    busPIRone (BUSPAR) 15 MG tablet Take 7.5-15 mg by mouth 2 (two) times daily. Take 0.5 tablet in morning and whole tablet at night    CALCIUM PO Take 1 tablet by mouth at bedtime.     docusate sodium  (COLACE) 100 MG capsule Take 100 mg by mouth 2 (two) times daily.    ibuprofen (ADVIL,MOTRIN) 200 MG tablet Take 400 mg by mouth every 6 (six) hours as needed for pain.    levothyroxine (SYNTHROID, LEVOTHROID) 50 MCG tablet Take 50 mcg by mouth daily before breakfast.    losartan-hydrochlorothiazide (HYZAAR) 50-12.5 MG per tablet Take 1 tablet by mouth daily.    metFORMIN (GLUCOPHAGE) 500 MG tablet Take 1,000 mg by mouth 2 (two) times daily with a meal.    Multiple Vitamin (MULTIVITAMIN WITH MINERALS) TABS Take 1 tablet by mouth daily.    pioglitazone (ACTOS) 30 MG tablet Take 30 mg by mouth daily.    simvastatin (ZOCOR) 40 MG tablet Take 40 mg by mouth every evening.       Allergies  Allergen Reactions  . Sulfa Antibiotics Nausea Only   Follow-up Information    Follow up with Ginette Otto, MD. Schedule an appointment as soon as possible for a visit in 2 weeks.   Specialty:  Internal Medicine   Contact information:   [address] [place] [address] [phone]       The results of significant diagnostics from this  hospitalization (including imaging, microbiology, ancillary and laboratory) are listed below for reference.    Significant Diagnostic Studies: Dg Chest 2 View  02/19/2015   CLINICAL DATA:  Chest pain with shortness of breath and weakness. Dizziness upon standing.  EXAM: CHEST  2 VIEW  COMPARISON:  12/25/2012.  FINDINGS: The heart size and mediastinal contours are within normal limits. Both lungs are clear. The visualized skeletal structures are unremarkable. Mild degenerative change shoulders and thoracic spine.  IMPRESSION: Negative exam.   Electronically Signed   By: Elsie Stain M.D.   On: 02/19/2015 14:35   Nm Myocar Multi W/spect W/wall Motion / Ef  02/20/2015   CLINICAL DATA:  Chest pain, diabetes, hypertension.  EXAM: MYOCARDIAL IMAGING WITH SPECT (REST AND PHARMACOLOGIC-STRESS)  GATED LEFT VENTRICULAR WALL MOTION STUDY  LEFT  VENTRICULAR EJECTION FRACTION  TECHNIQUE: Standard myocardial SPECT imaging was performed after resting intravenous injection of 10 mCi Tc-53m sestamibi. Subsequently, intravenous infusion of Lexiscan was performed under the supervision of the Cardiology staff. At peak effect of the drug, 30 mCi Tc-43m sestamibi was injected intravenously and standard myocardial SPECT imaging was performed. Quantitative gated imaging was also performed to evaluate left ventricular wall motion, and estimate left ventricular ejection fraction.  COMPARISON:  None.  FINDINGS: Perfusion: No decreased activity in the left ventricle on stress imaging to suggest reversible ischemia or infarction.  Wall Motion: Normal left ventricular wall motion. No left ventricular dilation.  Left Ventricular Ejection Fraction: 68 %  End diastolic volume 53 ml  End systolic volume 17 ml  IMPRESSION: 1. No reversible ischemia or infarction.  2. Normal left ventricular wall motion.  3. Left ventricular ejection fraction 68%  4. Low-risk stress test findings*.  *2012 Appropriate Use Criteria for Coronary Revascularization Focused Update: J Am Coll Cardiol. 2012;59(9):857-881. http://content.dementiazones.com.aspx?articleid=1201161   Electronically Signed   By: Signa Kell M.D.   On: 02/20/2015 13:30   Dg Knee Complete 4 Views Right  02/19/2015   CLINICAL DATA:  Fall, pain and swelling and ecchymosis of the right knee  EXAM: RIGHT KNEE - COMPLETE 4+ VIEW  COMPARISON:  None.  FINDINGS: There is no evidence of fracture, dislocation, or joint effusion. There is no evidence of arthropathy or other focal bone abnormality. Soft tissues are unremarkable. Mild tricompartmental spurring. Mild chondrocalcinosis or potentially calcified/ossified loose bodies.  IMPRESSION: Negative.   Electronically Signed   By: Christiana Pellant M.D.   On: 02/19/2015 14:21   Labs: Basic Metabolic Panel:  Recent Labs Lab 02/19/15 1420 02/19/15 1657 02/20/15 0447  NA 139   --  139  K 3.8  --  3.0*  CL 102  --  103  CO2 29  --  28  GLUCOSE 121*  --  117*  BUN 14  --  9  CREATININE 0.62  --  0.50  CALCIUM 9.3  --  9.1  MG  --  1.7  --    CBC:  Recent Labs Lab 02/19/15 1420 02/20/15 0447  WBC 5.5 3.9*  HGB 13.4 13.6  HCT 40.7 40.3  MCV 93.6 93.7  PLT 163 143*   Cardiac Enzymes:  Recent Labs Lab 02/19/15 1657 02/19/15 2222 02/20/15 0447  TROPONINI <0.03 <0.03 <0.03   CBG:  Recent Labs Lab 02/19/15 2141 02/20/15 1125  GLUCAP 164* 201*    Signed:  Vassie Loll  [facility] 02/20/2015, 4:06 PM

## 2015-02-20 NOTE — Progress Notes (Signed)
Pt discharged home with husband.  Alert and oriented x4.  No c/o pain.  Education given on diet, activity, meds, and follow-up care and appointments.  Pt verbalized understanding.  IV D/Cd. Tele D/Cd.

## 2015-02-20 NOTE — Progress Notes (Signed)
VASCULAR LAB PRELIMINARY  PRELIMINARY  PRELIMINARY  PRELIMINARY  Carotid duplex completed.    Preliminary report:  1-39% plaquing.  Vertebral artery flow is antegrade.  Isha Seefeld, RVT 02/20/2015, 12:34 PM

## 2015-02-20 NOTE — Progress Notes (Signed)
  Echocardiogram 2D Echocardiogram has been performed.  Chelsea Roberson 02/20/2015, 1:43 PM

## 2015-03-02 ENCOUNTER — Ambulatory Visit (INDEPENDENT_AMBULATORY_CARE_PROVIDER_SITE_OTHER): Payer: Medicare Other | Admitting: Internal Medicine

## 2015-03-02 ENCOUNTER — Encounter: Payer: Self-pay | Admitting: Internal Medicine

## 2015-03-02 VITALS — BP 128/74 | HR 60 | Ht 62.0 in | Wt 201.0 lb

## 2015-03-02 DIAGNOSIS — R0789 Other chest pain: Secondary | ICD-10-CM

## 2015-03-02 DIAGNOSIS — K219 Gastro-esophageal reflux disease without esophagitis: Secondary | ICD-10-CM

## 2015-03-02 DIAGNOSIS — R002 Palpitations: Secondary | ICD-10-CM | POA: Diagnosis not present

## 2015-03-02 DIAGNOSIS — I10 Essential (primary) hypertension: Secondary | ICD-10-CM

## 2015-03-02 NOTE — Progress Notes (Signed)
OFFICE NOTE  Chief Complaint:  Hospital follow-up  Primary Care Physician: Ginette Otto, MD  HPI:  Chelsea Roberson is a pleasant 69 year old female who I recently saw in consultation in the hospital for chest pain. She has a strong family history of heart disease as well as hypertension, dyslipidemia and diabetes. She underwent nuclear stress testing which showed no reversible ischemia and an EF of 68%. Subsequently she underwent an echocardiogram which showed normal systolic function and EF between 65-70%. There is stage I diastolic dysfunction and mild aortic insufficiency. She has had no recurrent chest pain since discharge. Her omeprazole dose was doubled and she feels that it may been related to reflux. He was started on low-dose aspirin and carvedilol which she is tolerating. Vitals today show excellent blood pressure control.  PMHx:  Past Medical History  Diagnosis Date  . Hypertension   . Hyperlipidemia   . DM (diabetes mellitus), type 2   . Hypothyroidism   . GERD (gastroesophageal reflux disease)   . Anxiety     Past Surgical History  Procedure Laterality Date  . Partial hysterectomy      FAMHx:  Family History  Problem Relation Age of Onset  . Failure to thrive Mother   . CAD Father     CAD diagnosed around 64, passed away at 53 of MI  . CAD Brother     Died in his 6s of MI, had had CABG about 2 years before he died    SOCHx:   reports that she has never smoked. She does not have any smokeless tobacco history on file. She reports that she does not drink alcohol or use illicit drugs.  ALLERGIES:  Allergies  Allergen Reactions  . Aspirin-Caffeine Other (See Comments)  . Sulfa Antibiotics Nausea Only and Other (See Comments)    ROS: A comprehensive review of systems was negative.  HOME MEDS: Current Outpatient Prescriptions  Medication Sig Dispense Refill  . aspirin EC 81 MG EC tablet Take 1 tablet (81 mg total) by mouth daily.    .  busPIRone (BUSPAR) 15 MG tablet Take 7.5-15 mg by mouth 2 (two) times daily. Take 0.5 tablet in morning and whole tablet at night    . CALCIUM PO Take 1 tablet by mouth at bedtime.     . carvedilol (COREG) 3.125 MG tablet Take 1 tablet (3.125 mg total) by mouth 2 (two) times daily with a meal. 60 tablet 2  . docusate sodium (COLACE) 100 MG capsule Take 100 mg by mouth 2 (two) times daily.    Marland Kitchen ibuprofen (ADVIL,MOTRIN) 200 MG tablet Take 400 mg by mouth every 6 (six) hours as needed for pain.    Marland Kitchen levothyroxine (SYNTHROID, LEVOTHROID) 50 MCG tablet Take 50 mcg by mouth daily before breakfast.    . losartan-hydrochlorothiazide (HYZAAR) 50-12.5 MG per tablet Take 1 tablet by mouth daily.    . meclizine (ANTIVERT) 25 MG tablet Take 25 mg by mouth as needed.    . metFORMIN (GLUCOPHAGE) 500 MG tablet Take 1,000 mg by mouth 2 (two) times daily with a meal.    . Multiple Vitamin (MULTIVITAMIN WITH MINERALS) TABS Take 1 tablet by mouth daily.    Marland Kitchen omeprazole (PRILOSEC) 20 MG capsule Take 1 capsule (20 mg total) by mouth 2 (two) times daily before a meal.    . pioglitazone (ACTOS) 30 MG tablet Take 30 mg by mouth daily.    . simvastatin (ZOCOR) 40 MG tablet Take 40 mg by mouth  every evening.    . potassium chloride 20 MEQ TBCR Take 40 mEq by mouth 2 (two) times daily. 8 tablet 0   No current facility-administered medications for this visit.    LABS/IMAGING: No results found for this or any previous visit (from the past 48 hour(s)). No results found.  WEIGHTS: Wt Readings from Last 3 Encounters:  03/02/15 201 lb (91.173 kg)  02/20/15 196 lb 6.9 oz (89.1 kg)  12/25/12 215 lb (97.523 kg)    VITALS: BP 128/74 mmHg  Pulse 60  Ht  (1.575 m)  Wt 201 lb (91.173 kg)  BMI 36.75 kg/m2  EXAM: General appearance: alert and no distress Neck: no carotid bruit, no JVD and thyroid not enlarged, symmetric, no tenderness/mass/nodules Lungs: clear to auscultation bilaterally Heart: regular rate and  rhythm, S1, S2 normal and diastolic murmur: early diastolic 2/6, crescendo at 2nd right intercostal space Abdomen: soft, non-tender; bowel sounds normal; no masses,  no organomegaly Extremities: extremities normal, atraumatic, no cyanosis or edema Pulses: 2+ and symmetric Skin: Skin color, texture, turgor normal. No rashes or lesions Neurologic: Grossly normal Psych: Pleasant  EKG: Deferred  ASSESSMENT: 1. Recent chest pain with a negative nuclear stress test 2. LVEF 65-70% with stage I diastolic dysfunction 3. Hypertension-controlled 4. Dyslipidemia 5. Diabetes type 2 6. Mild AI  PLAN: 1.   [name] had a negative stress test and chest pain which is likely related to GERD. She does have some diastolic dysfunction and signs of hypertensive heart disease. There is also mild AI. I agree with placing her on low-dose aspirin based on her family and personal risk factors and the fact that she is diabetic. She is also on low-dose beta blocker which she seems to be tolerating. With regards to follow-up of her aortic insufficiency, repeat echo in 1-3 years is reasonable. This can be ordered by her primary care provider as she wishes to be followed by him. I'm certainly happy to see her back on an as-needed basis.  Thank you for allowing me to participate in her care.  Chrystie Nose, MD, [facility] Attending Cardiologist [facility]  Lisette Abu Hilty 03/02/2015, 11:08 AM

## 2015-03-02 NOTE — Patient Instructions (Signed)
Your physician recommends that you schedule a follow-up appointment as needed with Dr. Hilty.  

## 2015-03-24 ENCOUNTER — Other Ambulatory Visit: Payer: Medicare Other

## 2015-03-24 ENCOUNTER — Encounter (INDEPENDENT_AMBULATORY_CARE_PROVIDER_SITE_OTHER): Payer: Medicare Other | Admitting: Ophthalmology

## 2015-03-24 DIAGNOSIS — E113211 Type 2 diabetes mellitus with mild nonproliferative diabetic retinopathy with macular edema, right eye: Secondary | ICD-10-CM

## 2015-03-24 DIAGNOSIS — H35033 Hypertensive retinopathy, bilateral: Secondary | ICD-10-CM

## 2015-03-24 DIAGNOSIS — E11311 Type 2 diabetes mellitus with unspecified diabetic retinopathy with macular edema: Secondary | ICD-10-CM | POA: Diagnosis not present

## 2015-03-24 DIAGNOSIS — E113312 Type 2 diabetes mellitus with moderate nonproliferative diabetic retinopathy with macular edema, left eye: Secondary | ICD-10-CM | POA: Diagnosis not present

## 2015-03-24 DIAGNOSIS — I10 Essential (primary) hypertension: Secondary | ICD-10-CM

## 2015-03-24 DIAGNOSIS — H43813 Vitreous degeneration, bilateral: Secondary | ICD-10-CM

## 2015-03-24 DIAGNOSIS — H2513 Age-related nuclear cataract, bilateral: Secondary | ICD-10-CM

## 2015-04-01 ENCOUNTER — Ambulatory Visit
Admission: RE | Admit: 2015-04-01 | Discharge: 2015-04-01 | Disposition: A | Discharge status: Home / Self Care | Payer: Medicare Other | Source: Ambulatory Visit | Attending: Internal Medicine | Admitting: Internal Medicine

## 2015-04-01 DIAGNOSIS — E049 Nontoxic goiter, unspecified: Secondary | ICD-10-CM

## 2015-05-05 ENCOUNTER — Encounter (INDEPENDENT_AMBULATORY_CARE_PROVIDER_SITE_OTHER): Payer: Medicare Other | Admitting: Ophthalmology

## 2015-05-10 ENCOUNTER — Encounter (INDEPENDENT_AMBULATORY_CARE_PROVIDER_SITE_OTHER): Payer: Medicare Other | Admitting: Ophthalmology

## 2015-05-10 DIAGNOSIS — E113213 Type 2 diabetes mellitus with mild nonproliferative diabetic retinopathy with macular edema, bilateral: Secondary | ICD-10-CM | POA: Diagnosis not present

## 2015-05-10 DIAGNOSIS — E11311 Type 2 diabetes mellitus with unspecified diabetic retinopathy with macular edema: Secondary | ICD-10-CM | POA: Diagnosis not present

## 2015-05-10 DIAGNOSIS — H35033 Hypertensive retinopathy, bilateral: Secondary | ICD-10-CM | POA: Diagnosis not present

## 2015-05-10 DIAGNOSIS — H43813 Vitreous degeneration, bilateral: Secondary | ICD-10-CM | POA: Diagnosis not present

## 2015-05-10 DIAGNOSIS — I10 Essential (primary) hypertension: Secondary | ICD-10-CM

## 2015-06-21 ENCOUNTER — Encounter (INDEPENDENT_AMBULATORY_CARE_PROVIDER_SITE_OTHER): Payer: Medicare Other | Admitting: Ophthalmology

## 2015-06-23 ENCOUNTER — Encounter (INDEPENDENT_AMBULATORY_CARE_PROVIDER_SITE_OTHER): Payer: Medicare Other | Admitting: Ophthalmology

## 2015-06-23 DIAGNOSIS — H35033 Hypertensive retinopathy, bilateral: Secondary | ICD-10-CM

## 2015-06-23 DIAGNOSIS — I10 Essential (primary) hypertension: Secondary | ICD-10-CM

## 2015-06-23 DIAGNOSIS — E11311 Type 2 diabetes mellitus with unspecified diabetic retinopathy with macular edema: Secondary | ICD-10-CM

## 2015-06-23 DIAGNOSIS — H2513 Age-related nuclear cataract, bilateral: Secondary | ICD-10-CM

## 2015-06-23 DIAGNOSIS — H43813 Vitreous degeneration, bilateral: Secondary | ICD-10-CM

## 2015-06-23 DIAGNOSIS — E113313 Type 2 diabetes mellitus with moderate nonproliferative diabetic retinopathy with macular edema, bilateral: Secondary | ICD-10-CM | POA: Diagnosis not present

## 2015-08-02 ENCOUNTER — Encounter (INDEPENDENT_AMBULATORY_CARE_PROVIDER_SITE_OTHER): Payer: Medicare Other | Admitting: Ophthalmology

## 2015-08-09 ENCOUNTER — Encounter (INDEPENDENT_AMBULATORY_CARE_PROVIDER_SITE_OTHER): Payer: Medicare Other | Admitting: Ophthalmology

## 2015-08-09 DIAGNOSIS — I10 Essential (primary) hypertension: Secondary | ICD-10-CM

## 2015-08-09 DIAGNOSIS — H43813 Vitreous degeneration, bilateral: Secondary | ICD-10-CM | POA: Diagnosis not present

## 2015-08-09 DIAGNOSIS — E11311 Type 2 diabetes mellitus with unspecified diabetic retinopathy with macular edema: Secondary | ICD-10-CM

## 2015-08-09 DIAGNOSIS — H35033 Hypertensive retinopathy, bilateral: Secondary | ICD-10-CM

## 2015-08-09 DIAGNOSIS — H2513 Age-related nuclear cataract, bilateral: Secondary | ICD-10-CM | POA: Diagnosis not present

## 2015-08-09 DIAGNOSIS — E113313 Type 2 diabetes mellitus with moderate nonproliferative diabetic retinopathy with macular edema, bilateral: Secondary | ICD-10-CM | POA: Diagnosis not present

## 2015-08-30 ENCOUNTER — Other Ambulatory Visit: Payer: Self-pay | Admitting: Geriatric Medicine

## 2015-08-30 DIAGNOSIS — R4701 Aphasia: Secondary | ICD-10-CM

## 2015-09-05 ENCOUNTER — Ambulatory Visit
Admission: RE | Admit: 2015-09-05 | Discharge: 2015-09-05 | Disposition: A | Discharge status: Home / Self Care | Payer: Medicare Other | Source: Ambulatory Visit | Attending: Geriatric Medicine | Admitting: Geriatric Medicine

## 2015-09-05 DIAGNOSIS — R4701 Aphasia: Secondary | ICD-10-CM

## 2015-09-05 MED ORDER — GADOBENATE DIMEGLUMINE 529 MG/ML IV SOLN
18.0000 mL | Freq: Once | INTRAVENOUS | Status: AC | PRN
  Administered 2015-09-05: 18 mL via INTRAVENOUS

## 2015-09-27 ENCOUNTER — Encounter (INDEPENDENT_AMBULATORY_CARE_PROVIDER_SITE_OTHER): Payer: Medicare Other | Admitting: Ophthalmology

## 2015-09-27 DIAGNOSIS — H35033 Hypertensive retinopathy, bilateral: Secondary | ICD-10-CM

## 2015-09-27 DIAGNOSIS — H2511 Age-related nuclear cataract, right eye: Secondary | ICD-10-CM | POA: Diagnosis not present

## 2015-09-27 DIAGNOSIS — E11311 Type 2 diabetes mellitus with unspecified diabetic retinopathy with macular edema: Secondary | ICD-10-CM

## 2015-09-27 DIAGNOSIS — E113313 Type 2 diabetes mellitus with moderate nonproliferative diabetic retinopathy with macular edema, bilateral: Secondary | ICD-10-CM

## 2015-09-27 DIAGNOSIS — I10 Essential (primary) hypertension: Secondary | ICD-10-CM

## 2015-09-27 DIAGNOSIS — H43813 Vitreous degeneration, bilateral: Secondary | ICD-10-CM

## 2015-11-10 ENCOUNTER — Encounter (INDEPENDENT_AMBULATORY_CARE_PROVIDER_SITE_OTHER): Payer: Medicare Other | Admitting: Ophthalmology

## 2015-11-10 DIAGNOSIS — H43813 Vitreous degeneration, bilateral: Secondary | ICD-10-CM | POA: Diagnosis not present

## 2015-11-10 DIAGNOSIS — I10 Essential (primary) hypertension: Secondary | ICD-10-CM

## 2015-11-10 DIAGNOSIS — H2511 Age-related nuclear cataract, right eye: Secondary | ICD-10-CM | POA: Diagnosis not present

## 2015-11-10 DIAGNOSIS — H35033 Hypertensive retinopathy, bilateral: Secondary | ICD-10-CM | POA: Diagnosis not present

## 2015-11-10 DIAGNOSIS — E113313 Type 2 diabetes mellitus with moderate nonproliferative diabetic retinopathy with macular edema, bilateral: Secondary | ICD-10-CM

## 2015-11-10 DIAGNOSIS — E11311 Type 2 diabetes mellitus with unspecified diabetic retinopathy with macular edema: Secondary | ICD-10-CM | POA: Diagnosis not present

## 2015-12-22 ENCOUNTER — Encounter (INDEPENDENT_AMBULATORY_CARE_PROVIDER_SITE_OTHER): Payer: Medicare Other | Admitting: Ophthalmology

## 2015-12-22 DIAGNOSIS — I10 Essential (primary) hypertension: Secondary | ICD-10-CM | POA: Diagnosis not present

## 2015-12-22 DIAGNOSIS — E113313 Type 2 diabetes mellitus with moderate nonproliferative diabetic retinopathy with macular edema, bilateral: Secondary | ICD-10-CM

## 2015-12-22 DIAGNOSIS — E11311 Type 2 diabetes mellitus with unspecified diabetic retinopathy with macular edema: Secondary | ICD-10-CM

## 2015-12-22 DIAGNOSIS — H2511 Age-related nuclear cataract, right eye: Secondary | ICD-10-CM

## 2015-12-22 DIAGNOSIS — H35033 Hypertensive retinopathy, bilateral: Secondary | ICD-10-CM

## 2015-12-22 DIAGNOSIS — H43813 Vitreous degeneration, bilateral: Secondary | ICD-10-CM

## 2016-02-22 ENCOUNTER — Encounter (INDEPENDENT_AMBULATORY_CARE_PROVIDER_SITE_OTHER): Payer: Medicare Other | Admitting: Ophthalmology

## 2016-02-22 DIAGNOSIS — E11311 Type 2 diabetes mellitus with unspecified diabetic retinopathy with macular edema: Secondary | ICD-10-CM

## 2016-02-22 DIAGNOSIS — E113313 Type 2 diabetes mellitus with moderate nonproliferative diabetic retinopathy with macular edema, bilateral: Secondary | ICD-10-CM

## 2016-02-22 DIAGNOSIS — I10 Essential (primary) hypertension: Secondary | ICD-10-CM | POA: Diagnosis not present

## 2016-02-22 DIAGNOSIS — H43813 Vitreous degeneration, bilateral: Secondary | ICD-10-CM

## 2016-02-22 DIAGNOSIS — H35033 Hypertensive retinopathy, bilateral: Secondary | ICD-10-CM | POA: Diagnosis not present

## 2016-02-22 DIAGNOSIS — H2511 Age-related nuclear cataract, right eye: Secondary | ICD-10-CM

## 2016-03-22 ENCOUNTER — Other Ambulatory Visit: Payer: Self-pay | Admitting: Gastroenterology

## 2016-04-05 ENCOUNTER — Encounter (INDEPENDENT_AMBULATORY_CARE_PROVIDER_SITE_OTHER): Payer: Medicare Other | Admitting: Ophthalmology

## 2016-04-05 DIAGNOSIS — I10 Essential (primary) hypertension: Secondary | ICD-10-CM

## 2016-04-05 DIAGNOSIS — H2511 Age-related nuclear cataract, right eye: Secondary | ICD-10-CM | POA: Diagnosis not present

## 2016-04-05 DIAGNOSIS — H43813 Vitreous degeneration, bilateral: Secondary | ICD-10-CM

## 2016-04-05 DIAGNOSIS — H35033 Hypertensive retinopathy, bilateral: Secondary | ICD-10-CM | POA: Diagnosis not present

## 2016-04-05 DIAGNOSIS — E11311 Type 2 diabetes mellitus with unspecified diabetic retinopathy with macular edema: Secondary | ICD-10-CM

## 2016-04-05 DIAGNOSIS — E113313 Type 2 diabetes mellitus with moderate nonproliferative diabetic retinopathy with macular edema, bilateral: Secondary | ICD-10-CM

## 2016-04-26 ENCOUNTER — Encounter (HOSPITAL_COMMUNITY): Payer: Self-pay | Admitting: *Deleted

## 2016-04-27 ENCOUNTER — Encounter (HOSPITAL_COMMUNITY): Payer: Self-pay | Admitting: *Deleted

## 2016-04-27 NOTE — Progress Notes (Signed)
REquested hgba1c done 02/2016 fromoffice of Dr Pete GlatterStoneking.  They are to fax.

## 2016-05-09 ENCOUNTER — Ambulatory Visit (HOSPITAL_COMMUNITY)
Admission: RE | Admit: 2016-05-09 | Discharge: 2016-05-09 | Disposition: A | Discharge status: Home / Self Care | Payer: Medicare Other | Source: Ambulatory Visit | Attending: Gastroenterology | Admitting: Gastroenterology

## 2016-05-09 ENCOUNTER — Encounter (HOSPITAL_COMMUNITY)
Admission: RE | Disposition: A | Discharge status: Home / Self Care | Payer: Self-pay | Source: Ambulatory Visit | Attending: Gastroenterology

## 2016-05-09 ENCOUNTER — Encounter (HOSPITAL_COMMUNITY): Payer: Self-pay

## 2016-05-09 ENCOUNTER — Ambulatory Visit (HOSPITAL_COMMUNITY): Payer: Medicare Other | Admitting: Anesthesiology

## 2016-05-09 DIAGNOSIS — Z79899 Other long term (current) drug therapy: Secondary | ICD-10-CM | POA: Insufficient documentation

## 2016-05-09 DIAGNOSIS — K219 Gastro-esophageal reflux disease without esophagitis: Secondary | ICD-10-CM | POA: Diagnosis not present

## 2016-05-09 DIAGNOSIS — E119 Type 2 diabetes mellitus without complications: Secondary | ICD-10-CM | POA: Diagnosis not present

## 2016-05-09 DIAGNOSIS — Z7984 Long term (current) use of oral hypoglycemic drugs: Secondary | ICD-10-CM | POA: Insufficient documentation

## 2016-05-09 DIAGNOSIS — E039 Hypothyroidism, unspecified: Secondary | ICD-10-CM | POA: Insufficient documentation

## 2016-05-09 DIAGNOSIS — G4733 Obstructive sleep apnea (adult) (pediatric): Secondary | ICD-10-CM | POA: Diagnosis not present

## 2016-05-09 DIAGNOSIS — Z1211 Encounter for screening for malignant neoplasm of colon: Secondary | ICD-10-CM | POA: Diagnosis not present

## 2016-05-09 DIAGNOSIS — M1990 Unspecified osteoarthritis, unspecified site: Secondary | ICD-10-CM | POA: Diagnosis not present

## 2016-05-09 DIAGNOSIS — I10 Essential (primary) hypertension: Secondary | ICD-10-CM | POA: Insufficient documentation

## 2016-05-09 DIAGNOSIS — F419 Anxiety disorder, unspecified: Secondary | ICD-10-CM | POA: Insufficient documentation

## 2016-05-09 HISTORY — DX: Unspecified osteoarthritis, unspecified site: M19.90

## 2016-05-09 HISTORY — DX: Adverse effect of unspecified anesthetic, initial encounter: T41.45XA

## 2016-05-09 HISTORY — DX: Other specified postprocedural states: Z98.890

## 2016-05-09 HISTORY — PX: COLONOSCOPY WITH PROPOFOL: SHX5780

## 2016-05-09 HISTORY — DX: Other complications of anesthesia, initial encounter: T88.59XA

## 2016-05-09 HISTORY — DX: Nausea with vomiting, unspecified: R11.2

## 2016-05-09 LAB — GLUCOSE, CAPILLARY: Glucose-Capillary: 108 mg/dL — ABNORMAL HIGH (ref 65–99)

## 2016-05-09 SURGERY — COLONOSCOPY WITH PROPOFOL
Anesthesia: Monitor Anesthesia Care

## 2016-05-09 MED ORDER — LACTATED RINGERS IV SOLN
INTRAVENOUS | Status: DC
  Administered 2016-05-09: 11:00:00 via INTRAVENOUS

## 2016-05-09 MED ORDER — SODIUM CHLORIDE 0.9 % IV SOLN: INTRAVENOUS | Status: DC

## 2016-05-09 MED ORDER — PROPOFOL 10 MG/ML IV BOLUS
INTRAVENOUS | Status: AC
  Filled 2016-05-09: qty 20

## 2016-05-09 MED ORDER — LIDOCAINE 2% (20 MG/ML) 5 ML SYRINGE
INTRAMUSCULAR | Status: DC | PRN
  Administered 2016-05-09: 80 mg via INTRAVENOUS

## 2016-05-09 MED ORDER — PROPOFOL 500 MG/50ML IV EMUL
INTRAVENOUS | Status: DC | PRN
  Administered 2016-05-09: 140 ug/kg/min via INTRAVENOUS

## 2016-05-09 MED ORDER — LIDOCAINE 2% (20 MG/ML) 5 ML SYRINGE
INTRAMUSCULAR | Status: AC
  Filled 2016-05-09: qty 5

## 2016-05-09 MED ORDER — PROPOFOL 10 MG/ML IV BOLUS
INTRAVENOUS | Status: DC | PRN
  Administered 2016-05-09 (×4): 20 mg via INTRAVENOUS

## 2016-05-09 SURGICAL SUPPLY — 21 items

## 2016-05-09 NOTE — Transfer of Care (Signed)
Immediate Anesthesia Transfer of Care Note  Patient: Chelsea Roberson  Procedure(s) Performed: Procedure(s): COLONOSCOPY WITH PROPOFOL (N/A)  Patient Location: Endoscopy Unit  Anesthesia Type:MAC  Level of Consciousness: awake, alert  and oriented  Airway & Oxygen Therapy: Patient Spontanous Breathing  Post-op Assessment: Report given to RN and Post -op Vital signs reviewed and stable  Post vital signs: Reviewed and stable  Last Vitals:  Vitals:   05/09/16 1024  BP: (!) 169/56  Pulse: 63  Resp: 10  Temp: 36.5 C    Last Pain:  Vitals:   05/09/16 1024  TempSrc: Oral         Complications: No apparent anesthesia complications

## 2016-05-09 NOTE — Anesthesia Postprocedure Evaluation (Signed)
Anesthesia Post Note  Patient: Chelsea Roberson  Procedure(s) Performed: Procedure(s) (LRB): COLONOSCOPY WITH PROPOFOL (N/A)  Patient location during evaluation: Endoscopy Anesthesia Type: MAC Level of consciousness: awake and alert, oriented and patient cooperative Pain management: pain level controlled Vital Signs Assessment: post-procedure vital signs reviewed and stable Respiratory status: spontaneous breathing, nonlabored ventilation and respiratory function stable Cardiovascular status: blood pressure returned to baseline and stable Postop Assessment: no signs of nausea or vomiting Anesthetic complications: no    Last Vitals:  Vitals:   05/09/16 1225 05/09/16 1230  BP:    Pulse: 72 72  Resp: 15 15  Temp:      Last Pain:  Vitals:   05/09/16 1155  TempSrc: Oral                 Korena Nass,E. Ellowyn Rieves

## 2016-05-09 NOTE — Discharge Instructions (Signed)

## 2016-05-09 NOTE — Op Note (Addendum)
[facility] Patient Name: Chelsea OppenheimFrances Bordas Procedure Date: 05/09/2016 MRN: [id number] Attending MD: Charolett BumpersMartin K Johnson , MD Date of Birth: 07-15-1945 CSN: [id number] Age: [age] Admit Type: Outpatient Procedure:                Colonoscopy Indications:              Screening for colorectal malignant neoplasm Providers:                Charolett BumpersMartin K. Johnson, MD, Anthony Saraniel Madden, RN, Darletta MollJackie                            Aiken Tech, Technician, Leroy Libmaniana Reardon, CRNA Referring MD:              Medicines:                Propofol per Anesthesia Complications:            No immediate complications. Estimated Blood Loss:     Estimated blood loss: none. Procedure:                Pre-Anesthesia Assessment:                           - Prior to the procedure, a History and Physical                            was performed, and patient medications and                            allergies were reviewed. The patient's tolerance of                            previous anesthesia was also reviewed. The risks                            and benefits of the procedure and the sedation                            options and risks were discussed with the patient.                            All questions were answered, and informed consent                            was obtained. Prior Anticoagulants: The patient has                            taken aspirin, last dose was 1 day prior to                            procedure. ASA Grade Assessment: III - A patient                            with severe systemic disease. After reviewing the  risks and benefits, the patient was deemed in                            satisfactory condition to undergo the procedure.                           After obtaining informed consent, the colonoscope                            was passed under direct vision. Throughout the                            procedure, the patient's blood pressure, pulse, and                             oxygen saturations were monitored continuously. The                            was introduced through the anus and advanced to the                            the cecum, identified by appendiceal orifice and                            ileocecal valve. The colonoscopy was technically                            difficult and complex due to significant looping.                            The patient tolerated the procedure well. The                            quality of the bowel preparation was good. The                            appendiceal orifice and the rectum were                            photographed. Scope In: 11:25:19 AM Scope Out: 11:47:08 AM Scope Withdrawal Time: 0 hours 8 minutes 39 seconds  Total Procedure Duration: 0 hours 21 minutes 49 seconds  Findings:      The perianal and digital rectal examinations were normal.      The entire examined colon appeared normal. Impression:               - The entire examined colon is normal.                           - No specimens collected. Moderate Sedation:      N/A- Per Anesthesia Care Recommendation:           - Patient has a contact number available for  emergencies. The signs and symptoms of potential                            delayed complications were discussed with the                            patient. Return to normal activities tomorrow.                            Written discharge instructions were provided to the                            patient.                           - Repeat colonoscopy is not recommended for                            screening purposes.                           - Resume previous diet.                           - Continue present medications. Procedure Code(s):        --- Professional ---                           R6045, Colorectal cancer screening; colonoscopy on                            individual not meeting criteria for high  risk Diagnosis Code(s):        --- Professional ---                           Z12.11, Encounter for screening for malignant                            neoplasm of colon CPT copyright 2016 American Medical Association. All rights reserved. The codes documented in this report are preliminary and upon coder review may  be revised to meet current compliance requirements. Danise Edge, MD Charolett Bumpers, MD 05/09/2016 11:53:42 AM This report has been signed electronically. Number of Addenda: 0

## 2016-05-09 NOTE — H&P (Signed)
Procedure: Screening colonoscopy. Normal screening colonoscopy was performed on 03/20/2006  History: The patient is a [age] year old female born Jan 28, 1946. She is scheduled to undergo a repeat screening colonoscopy today  Past medical history: Allergic rhinitis. Hypertension. Gastroesophageal reflux. Type 2 diabetes mellitus. Left wrist fracture. Osteoarthritis. Obstructive sleep apnea. Hypothyroidism. Multinodular goiter. Hysterectomy. Wrist fracture surgery.  Medication allergies: Sulfa drugs. Anacin. Ciprofloxacin.  Exam: Patient is alert and lying comfortably on the endoscopy stretcher. Abdomen is soft and nontender to palpation. Lungs are clear to auscultation. Cardiac exam reveals a regular rhythm.  Plan: Proceed with screening colonoscopy

## 2016-05-09 NOTE — Anesthesia Preprocedure Evaluation (Addendum)
Anesthesia Evaluation  Patient identified by MRN, date of birth, ID band Patient awake    Reviewed: Allergy & Precautions, NPO status , Patient's Chart, lab work & pertinent test results  History of Anesthesia Complications (+) PONV and history of anesthetic complications  Airway Mallampati: II  TM Distance: >3 FB Neck ROM: Full    Dental  (+) Dental Advisory Given   Pulmonary neg pulmonary ROS,    breath sounds clear to auscultation       Cardiovascular hypertension, Pt. on medications and Pt. on home beta blockers (-) angina Rhythm:Regular Rate:Normal  '16 ECHO: EF 65-70%, mild AI   Neuro/Psych Anxiety negative neurological ROS     GI/Hepatic Neg liver ROS, GERD  Medicated and Controlled,  Endo/Other  diabetes (glu 108), Oral Hypoglycemic AgentsHypothyroidism   Renal/GU negative Renal ROS     Musculoskeletal  (+) Arthritis ,   Abdominal   Peds  Hematology negative hematology ROS (+)   Anesthesia Other Findings   Reproductive/Obstetrics                            Anesthesia Physical Anesthesia Plan  ASA: II  Anesthesia Plan: MAC   Post-op Pain Management:    Induction:   Airway Management Planned: Natural Airway and Nasal Cannula  Additional Equipment:   Intra-op Plan:   Post-operative Plan:   Informed Consent: I have reviewed the patients History and Physical, chart, labs and discussed the procedure including the risks, benefits and alternatives for the proposed anesthesia with the patient or authorized representative who has indicated his/her understanding and acceptance.   Dental advisory given  Plan Discussed with: CRNA and Surgeon  Anesthesia Plan Comments: (Plan routine monitors, MAC)        Anesthesia Quick Evaluation

## 2016-05-10 ENCOUNTER — Encounter (HOSPITAL_COMMUNITY): Payer: Self-pay | Admitting: Gastroenterology

## 2016-05-17 ENCOUNTER — Encounter (INDEPENDENT_AMBULATORY_CARE_PROVIDER_SITE_OTHER): Payer: Medicare Other | Admitting: Ophthalmology

## 2016-05-17 DIAGNOSIS — H35033 Hypertensive retinopathy, bilateral: Secondary | ICD-10-CM | POA: Diagnosis not present

## 2016-05-17 DIAGNOSIS — H43813 Vitreous degeneration, bilateral: Secondary | ICD-10-CM

## 2016-05-17 DIAGNOSIS — I10 Essential (primary) hypertension: Secondary | ICD-10-CM

## 2016-05-17 DIAGNOSIS — H2511 Age-related nuclear cataract, right eye: Secondary | ICD-10-CM

## 2016-05-17 DIAGNOSIS — E113313 Type 2 diabetes mellitus with moderate nonproliferative diabetic retinopathy with macular edema, bilateral: Secondary | ICD-10-CM | POA: Diagnosis not present

## 2016-05-17 DIAGNOSIS — E11311 Type 2 diabetes mellitus with unspecified diabetic retinopathy with macular edema: Secondary | ICD-10-CM | POA: Diagnosis not present

## 2016-06-28 ENCOUNTER — Encounter (INDEPENDENT_AMBULATORY_CARE_PROVIDER_SITE_OTHER): Payer: Medicare Other | Admitting: Ophthalmology

## 2016-07-03 ENCOUNTER — Encounter (INDEPENDENT_AMBULATORY_CARE_PROVIDER_SITE_OTHER): Payer: Medicare Other | Admitting: Ophthalmology

## 2016-07-03 DIAGNOSIS — H43813 Vitreous degeneration, bilateral: Secondary | ICD-10-CM

## 2016-07-03 DIAGNOSIS — H35033 Hypertensive retinopathy, bilateral: Secondary | ICD-10-CM | POA: Diagnosis not present

## 2016-07-03 DIAGNOSIS — E11311 Type 2 diabetes mellitus with unspecified diabetic retinopathy with macular edema: Secondary | ICD-10-CM

## 2016-07-03 DIAGNOSIS — E113313 Type 2 diabetes mellitus with moderate nonproliferative diabetic retinopathy with macular edema, bilateral: Secondary | ICD-10-CM | POA: Diagnosis not present

## 2016-07-03 DIAGNOSIS — H2511 Age-related nuclear cataract, right eye: Secondary | ICD-10-CM | POA: Diagnosis not present

## 2016-07-03 DIAGNOSIS — I10 Essential (primary) hypertension: Secondary | ICD-10-CM | POA: Diagnosis not present

## 2016-08-14 ENCOUNTER — Encounter (INDEPENDENT_AMBULATORY_CARE_PROVIDER_SITE_OTHER): Payer: Medicare Other | Admitting: Ophthalmology

## 2016-09-20 ENCOUNTER — Encounter (INDEPENDENT_AMBULATORY_CARE_PROVIDER_SITE_OTHER): Payer: Medicare Other | Admitting: Ophthalmology

## 2016-09-20 DIAGNOSIS — E11311 Type 2 diabetes mellitus with unspecified diabetic retinopathy with macular edema: Secondary | ICD-10-CM | POA: Diagnosis not present

## 2016-09-20 DIAGNOSIS — H35033 Hypertensive retinopathy, bilateral: Secondary | ICD-10-CM

## 2016-09-20 DIAGNOSIS — H43813 Vitreous degeneration, bilateral: Secondary | ICD-10-CM | POA: Diagnosis not present

## 2016-09-20 DIAGNOSIS — E113312 Type 2 diabetes mellitus with moderate nonproliferative diabetic retinopathy with macular edema, left eye: Secondary | ICD-10-CM | POA: Diagnosis not present

## 2016-09-20 DIAGNOSIS — I10 Essential (primary) hypertension: Secondary | ICD-10-CM

## 2016-09-20 DIAGNOSIS — E113211 Type 2 diabetes mellitus with mild nonproliferative diabetic retinopathy with macular edema, right eye: Secondary | ICD-10-CM | POA: Diagnosis not present

## 2016-09-22 ENCOUNTER — Other Ambulatory Visit: Payer: Self-pay | Admitting: Geriatric Medicine

## 2016-09-22 DIAGNOSIS — E042 Nontoxic multinodular goiter: Secondary | ICD-10-CM

## 2016-09-25 ENCOUNTER — Encounter (INDEPENDENT_AMBULATORY_CARE_PROVIDER_SITE_OTHER): Payer: Medicare Other | Admitting: Ophthalmology

## 2016-09-28 ENCOUNTER — Ambulatory Visit
Admission: RE | Admit: 2016-09-28 | Discharge: 2016-09-28 | Disposition: A | Discharge status: Home / Self Care | Payer: Medicare Other | Source: Ambulatory Visit | Attending: Geriatric Medicine | Admitting: Geriatric Medicine

## 2016-09-28 DIAGNOSIS — E042 Nontoxic multinodular goiter: Secondary | ICD-10-CM

## 2016-11-01 ENCOUNTER — Encounter (INDEPENDENT_AMBULATORY_CARE_PROVIDER_SITE_OTHER): Payer: Medicare Other | Admitting: Ophthalmology

## 2016-11-01 DIAGNOSIS — I10 Essential (primary) hypertension: Secondary | ICD-10-CM

## 2016-11-01 DIAGNOSIS — E11311 Type 2 diabetes mellitus with unspecified diabetic retinopathy with macular edema: Secondary | ICD-10-CM

## 2016-11-01 DIAGNOSIS — H35033 Hypertensive retinopathy, bilateral: Secondary | ICD-10-CM

## 2016-11-01 DIAGNOSIS — E113313 Type 2 diabetes mellitus with moderate nonproliferative diabetic retinopathy with macular edema, bilateral: Secondary | ICD-10-CM

## 2016-11-01 DIAGNOSIS — H43813 Vitreous degeneration, bilateral: Secondary | ICD-10-CM

## 2016-12-15 ENCOUNTER — Encounter (INDEPENDENT_AMBULATORY_CARE_PROVIDER_SITE_OTHER): Payer: Medicare Other | Admitting: Ophthalmology

## 2016-12-15 DIAGNOSIS — H35033 Hypertensive retinopathy, bilateral: Secondary | ICD-10-CM | POA: Diagnosis not present

## 2016-12-15 DIAGNOSIS — E113313 Type 2 diabetes mellitus with moderate nonproliferative diabetic retinopathy with macular edema, bilateral: Secondary | ICD-10-CM

## 2016-12-15 DIAGNOSIS — I10 Essential (primary) hypertension: Secondary | ICD-10-CM | POA: Diagnosis not present

## 2016-12-15 DIAGNOSIS — H43813 Vitreous degeneration, bilateral: Secondary | ICD-10-CM | POA: Diagnosis not present

## 2016-12-15 DIAGNOSIS — E11311 Type 2 diabetes mellitus with unspecified diabetic retinopathy with macular edema: Secondary | ICD-10-CM

## 2017-02-01 ENCOUNTER — Encounter (INDEPENDENT_AMBULATORY_CARE_PROVIDER_SITE_OTHER): Payer: Medicare Other | Admitting: Ophthalmology

## 2017-02-01 DIAGNOSIS — I10 Essential (primary) hypertension: Secondary | ICD-10-CM | POA: Diagnosis not present

## 2017-02-01 DIAGNOSIS — H35033 Hypertensive retinopathy, bilateral: Secondary | ICD-10-CM

## 2017-02-01 DIAGNOSIS — H43813 Vitreous degeneration, bilateral: Secondary | ICD-10-CM

## 2017-02-01 DIAGNOSIS — E113313 Type 2 diabetes mellitus with moderate nonproliferative diabetic retinopathy with macular edema, bilateral: Secondary | ICD-10-CM

## 2017-02-01 DIAGNOSIS — E11311 Type 2 diabetes mellitus with unspecified diabetic retinopathy with macular edema: Secondary | ICD-10-CM

## 2017-03-15 ENCOUNTER — Encounter (INDEPENDENT_AMBULATORY_CARE_PROVIDER_SITE_OTHER): Payer: Medicare Other | Admitting: Ophthalmology

## 2017-03-15 DIAGNOSIS — E11311 Type 2 diabetes mellitus with unspecified diabetic retinopathy with macular edema: Secondary | ICD-10-CM | POA: Diagnosis not present

## 2017-03-15 DIAGNOSIS — H35033 Hypertensive retinopathy, bilateral: Secondary | ICD-10-CM | POA: Diagnosis not present

## 2017-03-15 DIAGNOSIS — H43813 Vitreous degeneration, bilateral: Secondary | ICD-10-CM | POA: Diagnosis not present

## 2017-03-15 DIAGNOSIS — I10 Essential (primary) hypertension: Secondary | ICD-10-CM | POA: Diagnosis not present

## 2017-03-15 DIAGNOSIS — E113313 Type 2 diabetes mellitus with moderate nonproliferative diabetic retinopathy with macular edema, bilateral: Secondary | ICD-10-CM | POA: Diagnosis not present

## 2017-04-26 ENCOUNTER — Encounter (INDEPENDENT_AMBULATORY_CARE_PROVIDER_SITE_OTHER): Payer: Medicare Other | Admitting: Ophthalmology

## 2017-04-26 DIAGNOSIS — E113313 Type 2 diabetes mellitus with moderate nonproliferative diabetic retinopathy with macular edema, bilateral: Secondary | ICD-10-CM | POA: Diagnosis not present

## 2017-04-26 DIAGNOSIS — H43813 Vitreous degeneration, bilateral: Secondary | ICD-10-CM | POA: Diagnosis not present

## 2017-04-26 DIAGNOSIS — I10 Essential (primary) hypertension: Secondary | ICD-10-CM | POA: Diagnosis not present

## 2017-04-26 DIAGNOSIS — H35033 Hypertensive retinopathy, bilateral: Secondary | ICD-10-CM

## 2017-04-26 DIAGNOSIS — E11311 Type 2 diabetes mellitus with unspecified diabetic retinopathy with macular edema: Secondary | ICD-10-CM | POA: Diagnosis not present

## 2017-05-31 ENCOUNTER — Encounter (INDEPENDENT_AMBULATORY_CARE_PROVIDER_SITE_OTHER): Payer: Medicare Other | Admitting: Ophthalmology

## 2017-05-31 DIAGNOSIS — I10 Essential (primary) hypertension: Secondary | ICD-10-CM

## 2017-05-31 DIAGNOSIS — E11311 Type 2 diabetes mellitus with unspecified diabetic retinopathy with macular edema: Secondary | ICD-10-CM | POA: Diagnosis not present

## 2017-05-31 DIAGNOSIS — H43813 Vitreous degeneration, bilateral: Secondary | ICD-10-CM

## 2017-05-31 DIAGNOSIS — H35033 Hypertensive retinopathy, bilateral: Secondary | ICD-10-CM | POA: Diagnosis not present

## 2017-05-31 DIAGNOSIS — E113313 Type 2 diabetes mellitus with moderate nonproliferative diabetic retinopathy with macular edema, bilateral: Secondary | ICD-10-CM | POA: Diagnosis not present

## 2017-07-12 ENCOUNTER — Encounter (INDEPENDENT_AMBULATORY_CARE_PROVIDER_SITE_OTHER): Payer: Medicare Other | Admitting: Ophthalmology

## 2017-07-12 DIAGNOSIS — I10 Essential (primary) hypertension: Secondary | ICD-10-CM

## 2017-07-12 DIAGNOSIS — E11311 Type 2 diabetes mellitus with unspecified diabetic retinopathy with macular edema: Secondary | ICD-10-CM | POA: Diagnosis not present

## 2017-07-12 DIAGNOSIS — H43813 Vitreous degeneration, bilateral: Secondary | ICD-10-CM | POA: Diagnosis not present

## 2017-07-12 DIAGNOSIS — H35033 Hypertensive retinopathy, bilateral: Secondary | ICD-10-CM | POA: Diagnosis not present

## 2017-07-12 DIAGNOSIS — E113313 Type 2 diabetes mellitus with moderate nonproliferative diabetic retinopathy with macular edema, bilateral: Secondary | ICD-10-CM | POA: Diagnosis not present

## 2017-08-24 ENCOUNTER — Encounter (INDEPENDENT_AMBULATORY_CARE_PROVIDER_SITE_OTHER): Payer: Medicare Other | Admitting: Ophthalmology

## 2017-08-24 DIAGNOSIS — E113313 Type 2 diabetes mellitus with moderate nonproliferative diabetic retinopathy with macular edema, bilateral: Secondary | ICD-10-CM | POA: Diagnosis not present

## 2017-08-24 DIAGNOSIS — E11311 Type 2 diabetes mellitus with unspecified diabetic retinopathy with macular edema: Secondary | ICD-10-CM

## 2017-08-24 DIAGNOSIS — H35033 Hypertensive retinopathy, bilateral: Secondary | ICD-10-CM | POA: Diagnosis not present

## 2017-08-24 DIAGNOSIS — I10 Essential (primary) hypertension: Secondary | ICD-10-CM

## 2017-08-24 DIAGNOSIS — H43813 Vitreous degeneration, bilateral: Secondary | ICD-10-CM | POA: Diagnosis not present

## 2017-09-03 ENCOUNTER — Ambulatory Visit
Admission: RE | Admit: 2017-09-03 | Discharge: 2017-09-03 | Disposition: A | Discharge status: Home / Self Care | Payer: Medicare Other | Source: Ambulatory Visit | Attending: Internal Medicine | Admitting: Internal Medicine

## 2017-09-03 ENCOUNTER — Other Ambulatory Visit: Payer: Self-pay | Admitting: Internal Medicine

## 2017-09-03 DIAGNOSIS — R0989 Other specified symptoms and signs involving the circulatory and respiratory systems: Secondary | ICD-10-CM

## 2017-09-03 DIAGNOSIS — R05 Cough: Secondary | ICD-10-CM

## 2017-09-03 DIAGNOSIS — R058 Other specified cough: Secondary | ICD-10-CM

## 2017-09-03 DIAGNOSIS — R591 Generalized enlarged lymph nodes: Secondary | ICD-10-CM

## 2017-09-03 DIAGNOSIS — E042 Nontoxic multinodular goiter: Secondary | ICD-10-CM

## 2017-09-17 ENCOUNTER — Ambulatory Visit
Admission: RE | Admit: 2017-09-17 | Discharge: 2017-09-17 | Disposition: A | Discharge status: Home / Self Care | Payer: Medicare Other | Source: Ambulatory Visit | Attending: Internal Medicine | Admitting: Internal Medicine

## 2017-09-17 DIAGNOSIS — E042 Nontoxic multinodular goiter: Secondary | ICD-10-CM

## 2017-09-17 DIAGNOSIS — R591 Generalized enlarged lymph nodes: Secondary | ICD-10-CM

## 2017-09-17 DIAGNOSIS — R0989 Other specified symptoms and signs involving the circulatory and respiratory systems: Secondary | ICD-10-CM

## 2017-10-19 ENCOUNTER — Encounter (INDEPENDENT_AMBULATORY_CARE_PROVIDER_SITE_OTHER): Payer: Medicare Other | Admitting: Ophthalmology

## 2017-10-19 DIAGNOSIS — E113211 Type 2 diabetes mellitus with mild nonproliferative diabetic retinopathy with macular edema, right eye: Secondary | ICD-10-CM | POA: Diagnosis not present

## 2017-10-19 DIAGNOSIS — H43813 Vitreous degeneration, bilateral: Secondary | ICD-10-CM

## 2017-10-19 DIAGNOSIS — I10 Essential (primary) hypertension: Secondary | ICD-10-CM | POA: Diagnosis not present

## 2017-10-19 DIAGNOSIS — E11311 Type 2 diabetes mellitus with unspecified diabetic retinopathy with macular edema: Secondary | ICD-10-CM | POA: Diagnosis not present

## 2017-10-19 DIAGNOSIS — H35033 Hypertensive retinopathy, bilateral: Secondary | ICD-10-CM

## 2017-10-19 DIAGNOSIS — E113312 Type 2 diabetes mellitus with moderate nonproliferative diabetic retinopathy with macular edema, left eye: Secondary | ICD-10-CM

## 2017-12-07 ENCOUNTER — Encounter (INDEPENDENT_AMBULATORY_CARE_PROVIDER_SITE_OTHER): Payer: Medicare Other | Admitting: Ophthalmology

## 2017-12-07 DIAGNOSIS — H35033 Hypertensive retinopathy, bilateral: Secondary | ICD-10-CM

## 2017-12-07 DIAGNOSIS — E113313 Type 2 diabetes mellitus with moderate nonproliferative diabetic retinopathy with macular edema, bilateral: Secondary | ICD-10-CM

## 2017-12-07 DIAGNOSIS — H43813 Vitreous degeneration, bilateral: Secondary | ICD-10-CM | POA: Diagnosis not present

## 2017-12-07 DIAGNOSIS — E11311 Type 2 diabetes mellitus with unspecified diabetic retinopathy with macular edema: Secondary | ICD-10-CM

## 2017-12-07 DIAGNOSIS — I10 Essential (primary) hypertension: Secondary | ICD-10-CM

## 2018-01-21 ENCOUNTER — Encounter (INDEPENDENT_AMBULATORY_CARE_PROVIDER_SITE_OTHER): Payer: Medicare Other | Admitting: Ophthalmology

## 2018-01-21 DIAGNOSIS — I10 Essential (primary) hypertension: Secondary | ICD-10-CM

## 2018-01-21 DIAGNOSIS — E11311 Type 2 diabetes mellitus with unspecified diabetic retinopathy with macular edema: Secondary | ICD-10-CM | POA: Diagnosis not present

## 2018-01-21 DIAGNOSIS — E113313 Type 2 diabetes mellitus with moderate nonproliferative diabetic retinopathy with macular edema, bilateral: Secondary | ICD-10-CM | POA: Diagnosis not present

## 2018-01-21 DIAGNOSIS — H35033 Hypertensive retinopathy, bilateral: Secondary | ICD-10-CM

## 2018-01-21 DIAGNOSIS — H43813 Vitreous degeneration, bilateral: Secondary | ICD-10-CM

## 2018-02-18 ENCOUNTER — Encounter (INDEPENDENT_AMBULATORY_CARE_PROVIDER_SITE_OTHER): Payer: Medicare Other | Admitting: Ophthalmology

## 2018-02-18 DIAGNOSIS — E113313 Type 2 diabetes mellitus with moderate nonproliferative diabetic retinopathy with macular edema, bilateral: Secondary | ICD-10-CM

## 2018-02-18 DIAGNOSIS — E11311 Type 2 diabetes mellitus with unspecified diabetic retinopathy with macular edema: Secondary | ICD-10-CM

## 2018-02-18 DIAGNOSIS — H35033 Hypertensive retinopathy, bilateral: Secondary | ICD-10-CM

## 2018-02-18 DIAGNOSIS — H43813 Vitreous degeneration, bilateral: Secondary | ICD-10-CM

## 2018-02-18 DIAGNOSIS — I10 Essential (primary) hypertension: Secondary | ICD-10-CM | POA: Diagnosis not present

## 2018-02-28 ENCOUNTER — Other Ambulatory Visit: Payer: Self-pay | Admitting: Internal Medicine

## 2018-02-28 DIAGNOSIS — E042 Nontoxic multinodular goiter: Secondary | ICD-10-CM

## 2018-04-01 ENCOUNTER — Encounter (INDEPENDENT_AMBULATORY_CARE_PROVIDER_SITE_OTHER): Payer: Medicare Other | Admitting: Ophthalmology

## 2018-04-01 DIAGNOSIS — H35033 Hypertensive retinopathy, bilateral: Secondary | ICD-10-CM | POA: Diagnosis not present

## 2018-04-01 DIAGNOSIS — I10 Essential (primary) hypertension: Secondary | ICD-10-CM | POA: Diagnosis not present

## 2018-04-01 DIAGNOSIS — E113313 Type 2 diabetes mellitus with moderate nonproliferative diabetic retinopathy with macular edema, bilateral: Secondary | ICD-10-CM | POA: Diagnosis not present

## 2018-04-01 DIAGNOSIS — E11311 Type 2 diabetes mellitus with unspecified diabetic retinopathy with macular edema: Secondary | ICD-10-CM

## 2018-04-01 DIAGNOSIS — H43813 Vitreous degeneration, bilateral: Secondary | ICD-10-CM

## 2018-04-03 ENCOUNTER — Ambulatory Visit
Admission: RE | Admit: 2018-04-03 | Discharge: 2018-04-03 | Disposition: A | Discharge status: Home / Self Care | Payer: Medicare Other | Source: Ambulatory Visit | Attending: Internal Medicine | Admitting: Internal Medicine

## 2018-04-03 DIAGNOSIS — E042 Nontoxic multinodular goiter: Secondary | ICD-10-CM

## 2018-05-13 ENCOUNTER — Encounter (INDEPENDENT_AMBULATORY_CARE_PROVIDER_SITE_OTHER): Payer: Medicare Other | Admitting: Ophthalmology

## 2018-05-13 DIAGNOSIS — H43813 Vitreous degeneration, bilateral: Secondary | ICD-10-CM

## 2018-05-13 DIAGNOSIS — H35033 Hypertensive retinopathy, bilateral: Secondary | ICD-10-CM

## 2018-05-13 DIAGNOSIS — E11311 Type 2 diabetes mellitus with unspecified diabetic retinopathy with macular edema: Secondary | ICD-10-CM

## 2018-05-13 DIAGNOSIS — E113313 Type 2 diabetes mellitus with moderate nonproliferative diabetic retinopathy with macular edema, bilateral: Secondary | ICD-10-CM

## 2018-05-13 DIAGNOSIS — I10 Essential (primary) hypertension: Secondary | ICD-10-CM

## 2018-05-14 ENCOUNTER — Inpatient Hospital Stay (HOSPITAL_COMMUNITY)
Admission: EM | Admit: 2018-05-14 | Discharge: 2018-05-20 | DRG: 481 | Disposition: A | Discharge status: Skilled Nursing Facility | Payer: Medicare Other | Attending: Family Medicine | Admitting: Family Medicine

## 2018-05-14 ENCOUNTER — Emergency Department (HOSPITAL_COMMUNITY): Payer: Medicare Other

## 2018-05-14 ENCOUNTER — Encounter (HOSPITAL_COMMUNITY): Payer: Self-pay | Admitting: Emergency Medicine

## 2018-05-14 ENCOUNTER — Other Ambulatory Visit: Payer: Self-pay

## 2018-05-14 DIAGNOSIS — K219 Gastro-esophageal reflux disease without esophagitis: Secondary | ICD-10-CM | POA: Diagnosis present

## 2018-05-14 DIAGNOSIS — Z419 Encounter for procedure for purposes other than remedying health state, unspecified: Secondary | ICD-10-CM

## 2018-05-14 DIAGNOSIS — G4700 Insomnia, unspecified: Secondary | ICD-10-CM | POA: Diagnosis present

## 2018-05-14 DIAGNOSIS — Z7982 Long term (current) use of aspirin: Secondary | ICD-10-CM | POA: Diagnosis not present

## 2018-05-14 DIAGNOSIS — W19XXXA Unspecified fall, initial encounter: Secondary | ICD-10-CM

## 2018-05-14 DIAGNOSIS — Z9181 History of falling: Secondary | ICD-10-CM | POA: Diagnosis not present

## 2018-05-14 DIAGNOSIS — E876 Hypokalemia: Secondary | ICD-10-CM | POA: Diagnosis present

## 2018-05-14 DIAGNOSIS — S72001A Fracture of unspecified part of neck of right femur, initial encounter for closed fracture: Secondary | ICD-10-CM | POA: Diagnosis not present

## 2018-05-14 DIAGNOSIS — Z886 Allergy status to analgesic agent status: Secondary | ICD-10-CM

## 2018-05-14 DIAGNOSIS — E039 Hypothyroidism, unspecified: Secondary | ICD-10-CM | POA: Diagnosis present

## 2018-05-14 DIAGNOSIS — E119 Type 2 diabetes mellitus without complications: Secondary | ICD-10-CM | POA: Diagnosis present

## 2018-05-14 DIAGNOSIS — Z8249 Family history of ischemic heart disease and other diseases of the circulatory system: Secondary | ICD-10-CM | POA: Diagnosis not present

## 2018-05-14 DIAGNOSIS — Y92008 Other place in unspecified non-institutional (private) residence as the place of occurrence of the external cause: Secondary | ICD-10-CM | POA: Diagnosis not present

## 2018-05-14 DIAGNOSIS — M1990 Unspecified osteoarthritis, unspecified site: Secondary | ICD-10-CM | POA: Diagnosis present

## 2018-05-14 DIAGNOSIS — Z881 Allergy status to other antibiotic agents status: Secondary | ICD-10-CM

## 2018-05-14 DIAGNOSIS — S7290XA Unspecified fracture of unspecified femur, initial encounter for closed fracture: Secondary | ICD-10-CM

## 2018-05-14 DIAGNOSIS — Z882 Allergy status to sulfonamides status: Secondary | ICD-10-CM | POA: Diagnosis not present

## 2018-05-14 DIAGNOSIS — Z79890 Hormone replacement therapy (postmenopausal): Secondary | ICD-10-CM | POA: Diagnosis not present

## 2018-05-14 DIAGNOSIS — Z79899 Other long term (current) drug therapy: Secondary | ICD-10-CM | POA: Diagnosis not present

## 2018-05-14 DIAGNOSIS — D62 Acute posthemorrhagic anemia: Secondary | ICD-10-CM | POA: Diagnosis not present

## 2018-05-14 DIAGNOSIS — S72141A Displaced intertrochanteric fracture of right femur, initial encounter for closed fracture: Secondary | ICD-10-CM | POA: Diagnosis present

## 2018-05-14 DIAGNOSIS — I10 Essential (primary) hypertension: Secondary | ICD-10-CM | POA: Diagnosis present

## 2018-05-14 DIAGNOSIS — Z7984 Long term (current) use of oral hypoglycemic drugs: Secondary | ICD-10-CM

## 2018-05-14 DIAGNOSIS — E785 Hyperlipidemia, unspecified: Secondary | ICD-10-CM | POA: Diagnosis present

## 2018-05-14 DIAGNOSIS — W010XXA Fall on same level from slipping, tripping and stumbling without subsequent striking against object, initial encounter: Secondary | ICD-10-CM | POA: Diagnosis present

## 2018-05-14 DIAGNOSIS — Z90711 Acquired absence of uterus with remaining cervical stump: Secondary | ICD-10-CM | POA: Diagnosis not present

## 2018-05-14 DIAGNOSIS — M25551 Pain in right hip: Secondary | ICD-10-CM | POA: Diagnosis present

## 2018-05-14 DIAGNOSIS — F0390 Unspecified dementia without behavioral disturbance: Secondary | ICD-10-CM | POA: Diagnosis present

## 2018-05-14 DIAGNOSIS — F419 Anxiety disorder, unspecified: Secondary | ICD-10-CM | POA: Diagnosis present

## 2018-05-14 HISTORY — DX: Unspecified dementia, unspecified severity, without behavioral disturbance, psychotic disturbance, mood disturbance, and anxiety: F03.90

## 2018-05-14 HISTORY — DX: Fracture of unspecified part of neck of right femur, initial encounter for closed fracture: S72.001A

## 2018-05-14 LAB — CBC WITH DIFFERENTIAL/PLATELET
ABS IMMATURE GRANULOCYTES: 0.02 10*3/uL (ref 0.00–0.07)
BASOS ABS: 0 10*3/uL (ref 0.0–0.1)
Basophils Relative: 1 %
EOS ABS: 0 10*3/uL (ref 0.0–0.5)
Eosinophils Relative: 0 %
HEMATOCRIT: 39.4 % (ref 36.0–46.0)
Hemoglobin: 12.4 g/dL (ref 12.0–15.0)
Immature Granulocytes: 0 %
LYMPHS ABS: 1.2 10*3/uL (ref 0.7–4.0)
Lymphocytes Relative: 18 %
MCH: 31.6 pg (ref 26.0–34.0)
MCHC: 31.5 g/dL (ref 30.0–36.0)
MCV: 100.3 fL — ABNORMAL HIGH (ref 80.0–100.0)
Monocytes Absolute: 0.4 10*3/uL (ref 0.1–1.0)
Monocytes Relative: 6 %
NEUTROS PCT: 75 %
NRBC: 0 % (ref 0.0–0.2)
Neutro Abs: 4.8 10*3/uL (ref 1.7–7.7)
PLATELETS: 180 10*3/uL (ref 150–400)
RBC: 3.93 MIL/uL (ref 3.87–5.11)
RDW: 14 % (ref 11.5–15.5)
WBC: 6.4 10*3/uL (ref 4.0–10.5)

## 2018-05-14 LAB — BASIC METABOLIC PANEL
ANION GAP: 10 (ref 5–15)
BUN: 13 mg/dL (ref 8–23)
CALCIUM: 9.1 mg/dL (ref 8.9–10.3)
CO2: 27 mmol/L (ref 22–32)
Chloride: 101 mmol/L (ref 98–111)
Creatinine, Ser: 0.7 mg/dL (ref 0.44–1.00)
GFR calc Af Amer: >60 mL/min (ref >60)
Glucose, Bld: 146 mg/dL — ABNORMAL HIGH (ref 70–99)
POTASSIUM: 3.5 mmol/L (ref 3.5–5.1)
Sodium: 138 mmol/L (ref 135–145)

## 2018-05-14 LAB — CBG MONITORING, ED: Glucose-Capillary: 124 mg/dL — ABNORMAL HIGH (ref 70–99)

## 2018-05-14 LAB — GLUCOSE, CAPILLARY: Glucose-Capillary: 135 mg/dL — ABNORMAL HIGH (ref 70–99)

## 2018-05-14 LAB — MRSA PCR SCREENING: MRSA by PCR: NEGATIVE

## 2018-05-14 MED ORDER — HYDROCODONE-ACETAMINOPHEN 5-325 MG PO TABS
1.0000 | ORAL_TABLET | Freq: Four times a day (QID) | ORAL | Status: DC | PRN
  Administered 2018-05-14 – 2018-05-19 (×17): 2 via ORAL
  Administered 2018-05-20: 1 via ORAL
  Filled 2018-05-14 (×2): qty 2
  Filled 2018-05-14: qty 1
  Filled 2018-05-14 (×15): qty 2

## 2018-05-14 MED ORDER — LOSARTAN POTASSIUM 50 MG PO TABS
50.0000 mg | ORAL_TABLET | Freq: Every day | ORAL | Status: DC
  Administered 2018-05-16 – 2018-05-20 (×5): 50 mg via ORAL
  Filled 2018-05-14 (×5): qty 1

## 2018-05-14 MED ORDER — BUSPIRONE HCL 15 MG PO TABS: 7.5000 mg | ORAL_TABLET | Freq: Two times a day (BID) | ORAL | Status: DC

## 2018-05-14 MED ORDER — TRIMETHOPRIM 100 MG PO TABS
100.0000 mg | ORAL_TABLET | Freq: Every day | ORAL | Status: DC
  Administered 2018-05-16 – 2018-05-20 (×5): 100 mg via ORAL
  Filled 2018-05-14 (×6): qty 1

## 2018-05-14 MED ORDER — POLYETHYLENE GLYCOL 3350 17 G PO PACK: 17.0000 g | PACK | Freq: Every day | ORAL | Status: DC | PRN

## 2018-05-14 MED ORDER — SIMVASTATIN 20 MG PO TABS
20.0000 mg | ORAL_TABLET | Freq: Every day | ORAL | Status: DC
  Administered 2018-05-14 – 2018-05-19 (×6): 20 mg via ORAL
  Filled 2018-05-14 (×7): qty 1

## 2018-05-14 MED ORDER — BRIMONIDINE TARTRATE-TIMOLOL 0.2-0.5 % OP SOLN: 1.0000 [drp] | Freq: Two times a day (BID) | OPHTHALMIC | Status: DC

## 2018-05-14 MED ORDER — POVIDONE-IODINE 10 % EX SWAB: 2.0000 "application " | Freq: Once | CUTANEOUS | Status: DC

## 2018-05-14 MED ORDER — INSULIN ASPART 100 UNIT/ML ~~LOC~~ SOLN
0.0000 [IU] | Freq: Three times a day (TID) | SUBCUTANEOUS | Status: DC
  Administered 2018-05-14 – 2018-05-15 (×2): 2 [IU] via SUBCUTANEOUS

## 2018-05-14 MED ORDER — METHOCARBAMOL 500 MG PO TABS
500.0000 mg | ORAL_TABLET | Freq: Four times a day (QID) | ORAL | Status: DC | PRN
  Administered 2018-05-14 – 2018-05-20 (×5): 500 mg via ORAL
  Filled 2018-05-14 (×5): qty 1

## 2018-05-14 MED ORDER — MORPHINE SULFATE (PF) 2 MG/ML IV SOLN
0.5000 mg | INTRAVENOUS | Status: DC | PRN
  Administered 2018-05-15 (×2): 0.5 mg via INTRAVENOUS
  Filled 2018-05-14 (×2): qty 1

## 2018-05-14 MED ORDER — PANTOPRAZOLE SODIUM 40 MG PO TBEC
40.0000 mg | DELAYED_RELEASE_TABLET | Freq: Every day | ORAL | Status: DC
  Administered 2018-05-16 – 2018-05-20 (×5): 40 mg via ORAL
  Filled 2018-05-14 (×5): qty 1

## 2018-05-14 MED ORDER — BUSPIRONE HCL 5 MG PO TABS
7.5000 mg | ORAL_TABLET | Freq: Every day | ORAL | Status: DC
  Administered 2018-05-16 – 2018-05-20 (×5): 7.5 mg via ORAL
  Filled 2018-05-14 (×5): qty 2

## 2018-05-14 MED ORDER — BISACODYL 5 MG PO TBEC
5.0000 mg | DELAYED_RELEASE_TABLET | Freq: Every day | ORAL | Status: DC | PRN
  Administered 2018-05-19 – 2018-05-20 (×2): 5 mg via ORAL
  Filled 2018-05-14 (×2): qty 1

## 2018-05-14 MED ORDER — CHLORHEXIDINE GLUCONATE 4 % EX LIQD
60.0000 mL | Freq: Once | CUTANEOUS | Status: AC
  Administered 2018-05-15: 4 via TOPICAL
  Filled 2018-05-14: qty 15

## 2018-05-14 MED ORDER — CARVEDILOL 3.125 MG PO TABS
3.1250 mg | ORAL_TABLET | Freq: Two times a day (BID) | ORAL | Status: DC
  Administered 2018-05-14 – 2018-05-20 (×12): 3.125 mg via ORAL
  Filled 2018-05-14 (×12): qty 1

## 2018-05-14 MED ORDER — LEVOTHYROXINE SODIUM 50 MCG PO TABS
50.0000 ug | ORAL_TABLET | Freq: Every day | ORAL | Status: DC
  Administered 2018-05-15 – 2018-05-20 (×6): 50 ug via ORAL
  Filled 2018-05-14 (×6): qty 1

## 2018-05-14 MED ORDER — TRAZODONE HCL 50 MG PO TABS
50.0000 mg | ORAL_TABLET | Freq: Every day | ORAL | Status: DC
  Administered 2018-05-14 – 2018-05-19 (×6): 50 mg via ORAL
  Filled 2018-05-14 (×6): qty 1

## 2018-05-14 MED ORDER — BUSPIRONE HCL 5 MG PO TABS
15.0000 mg | ORAL_TABLET | Freq: Every day | ORAL | Status: DC
  Administered 2018-05-14 – 2018-05-19 (×6): 15 mg via ORAL
  Filled 2018-05-14 (×6): qty 1

## 2018-05-14 MED ORDER — LOSARTAN POTASSIUM-HCTZ 50-12.5 MG PO TABS: 1.0000 | ORAL_TABLET | Freq: Every day | ORAL | Status: DC

## 2018-05-14 MED ORDER — INSULIN ASPART 100 UNIT/ML ~~LOC~~ SOLN: 0.0000 [IU] | Freq: Every day | SUBCUTANEOUS | Status: DC

## 2018-05-14 MED ORDER — DOCUSATE SODIUM 100 MG PO CAPS
100.0000 mg | ORAL_CAPSULE | Freq: Two times a day (BID) | ORAL | Status: DC
  Administered 2018-05-14: 100 mg via ORAL
  Filled 2018-05-14: qty 1

## 2018-05-14 MED ORDER — CEFAZOLIN SODIUM-DEXTROSE 2-4 GM/100ML-% IV SOLN
2.0000 g | INTRAVENOUS | Status: AC
  Administered 2018-05-15: 2 g via INTRAVENOUS
  Filled 2018-05-14: qty 100

## 2018-05-14 MED ORDER — FENTANYL CITRATE (PF) 100 MCG/2ML IJ SOLN
50.0000 ug | Freq: Once | INTRAMUSCULAR | Status: AC
  Administered 2018-05-14: 50 ug via INTRAVENOUS
  Filled 2018-05-14: qty 2

## 2018-05-14 MED ORDER — METHOCARBAMOL 1000 MG/10ML IJ SOLN
500.0000 mg | Freq: Four times a day (QID) | INTRAVENOUS | Status: DC | PRN
  Filled 2018-05-14: qty 5

## 2018-05-14 MED ORDER — CEFAZOLIN SODIUM-DEXTROSE 2-4 GM/100ML-% IV SOLN: 2.0000 g | INTRAVENOUS | Status: DC

## 2018-05-14 MED ORDER — HYDROCHLOROTHIAZIDE 12.5 MG PO CAPS
12.5000 mg | ORAL_CAPSULE | Freq: Every day | ORAL | Status: DC
  Administered 2018-05-16 – 2018-05-20 (×5): 12.5 mg via ORAL
  Filled 2018-05-14 (×5): qty 1

## 2018-05-14 NOTE — Consult Note (Signed)
Reason for Consult:Right hip fx Referring Physician: A Curatolo  Chelsea Roberson is an 72 y.o. female.  HPI: Chelsea Roberson was stepping outside her house and fell from a small stoop into her yard. She says she slipped and there was no syncope/presyncope. She had immediate right hip pain and could not get up. She was brought to the ED by EMS and x-rays showed a right intertroch hip fx and orthopedic surgery was consulted. She fell about 2 weeks ago and sprained her left knee and ankle and has been getting over that. Despite the 2 recent falls she does not have a history of frequent falls. She lives at home with her husband.  Past Medical History:  Diagnosis Date  . Anxiety   . Arthritis   . Complication of anesthesia   . DM (diabetes mellitus), type 2 (HCC)   . GERD (gastroesophageal reflux disease)   . Hyperlipidemia   . Hypertension   . Hypothyroidism   . PONV (postoperative nausea and vomiting)     Past Surgical History:  Procedure Laterality Date  . COLONOSCOPY WITH PROPOFOL N/A 05/09/2016   Procedure: COLONOSCOPY WITH PROPOFOL;  Surgeon: Charolett Bumpers, MD;  Location: [facility];  Service: Endoscopy;  Laterality: N/A;  . PARTIAL HYSTERECTOMY      Family History  Problem Relation Age of Onset  . Failure to thrive Mother   . CAD Father        CAD diagnosed around 6, passed away at 51 of MI  . CAD Brother        Died in his 14s of MI, had had CABG about 2 years before he died    Social History:  reports that she has never smoked. She has never used smokeless tobacco. She reports that she does not drink alcohol or use drugs.  Allergies:  Allergies  Allergen Reactions  . Aspirin-Caffeine Swelling and Other (See Comments)  . Sulfa Antibiotics Nausea Only and Other (See Comments)    Medications: I have reviewed the patient's current medications.  No results found for this or any previous visit (from the past 48 hour(s)).  Dg Chest 1 View  Result Date:  05/14/2018 CLINICAL DATA:  Larey Seat today.  Right hip fracture. EXAM: CHEST  1 VIEW COMPARISON:  09/03/2017 FINDINGS: The cardiac silhouette, mediastinal and hilar contours are within normal limits and stable. The lungs are clear. No pleural effusion. The bony structures are intact. Suspect remote left humeral neck fracture. IMPRESSION: No acute cardiopulmonary findings and intact bony thorax. Electronically Signed   By: Rudie Meyer M.D.   On: 05/14/2018 10:31   Dg Hip Unilat With Pelvis 2-3 Views Right  Result Date: 05/14/2018 CLINICAL DATA:  Larey Seat today.  Right hip pain. EXAM: DG HIP (WITH OR WITHOUT PELVIS) 2-3V RIGHT COMPARISON:  Radiographs 11/11/2014 FINDINGS: There is a minimally displaced intertrochanteric fracture of the right hip. No femoral neck fracture. The bony pelvis is intact. No pelvic fractures. IMPRESSION: Mildly displaced intertrochanteric fracture of the right hip. Electronically Signed   By: Rudie Meyer M.D.   On: 05/14/2018 10:32   Dg Femur Min 2 Views Right  Result Date: 05/14/2018 CLINICAL DATA:  Larey Seat today.  Right leg pain. EXAM: RIGHT FEMUR 2 VIEWS COMPARISON:  None. FINDINGS: Intertrochanteric right hip fracture is noted. No femoral shaft fracture. IMPRESSION: Intertrochanteric fracture of the right hip. No femoral shaft fracture. Electronically Signed   By: Rudie Meyer M.D.   On: 05/14/2018 10:33    Review of Systems  Constitutional: Negative for weight loss.  HENT: Negative for ear discharge, ear pain, hearing loss and tinnitus.   Eyes: Negative for blurred vision, double vision, photophobia and pain.  Respiratory: Negative for cough, sputum production and shortness of breath.   Cardiovascular: Negative for chest pain.  Gastrointestinal: Negative for abdominal pain, nausea and vomiting.  Genitourinary: Negative for dysuria, flank pain, frequency and urgency.  Musculoskeletal: Positive for joint pain (Right hip, left knee/ankle). Negative for back pain, falls,  myalgias and neck pain.  Neurological: Negative for dizziness, tingling, sensory change, focal weakness, loss of consciousness and headaches.  Endo/Heme/Allergies: Does not bruise/bleed easily.  Psychiatric/Behavioral: Negative for depression, memory loss and substance abuse. The patient is not nervous/anxious.    Blood pressure (!) 167/81, pulse 66, temperature 98.9 F (37.2 C), temperature source Oral, resp. rate 14, height 5\' 4"  (1.626 m), weight 59 kg, SpO2 99 %. Physical Exam  Constitutional: She appears well-developed and well-nourished. No distress.  HENT:  Head: Normocephalic and atraumatic.  Eyes: Conjunctivae are normal. Right eye exhibits no discharge. Left eye exhibits no discharge. No scleral icterus.  Neck: Normal range of motion.  Cardiovascular: Normal rate and regular rhythm.  Respiratory: Effort normal. No respiratory distress.  Musculoskeletal:  RLE No traumatic wounds, ecchymosis, or rash  TTP hip  No knee or ankle effusion  Knee stable to varus/ valgus and anterior/posterior stress  Sens DPN, SPN, TN intact  Motor EHL, ext, flex, evers 5/5  DP 2+, PT 1+, No significant edema  Neurological: She is alert.  Skin: Skin is warm and dry. She is not diaphoretic.  Psychiatric: She has a normal mood and affect. Her behavior is normal.    Assessment/Plan: Right hip fx -- Plan on IMN tomorrow morning by Dr. Jena GaussHaddix. NPO after MN. Multiple medical problems including DM, GERD, HTN, HLD, and hypothyroidism -- IM to admit, appreciate their help    Freeman CaldronMichael J. Kennette Cuthrell, PA-C Orthopedic Surgery [phone] 05/14/2018, 11:21 AM

## 2018-05-14 NOTE — ED Notes (Signed)
Got patient on the monitor did ekg shown to Dr Curatolo patient is resting with call bell in reach °

## 2018-05-14 NOTE — ED Notes (Signed)
[address] to give report. Receiving RN did not answer the phone.  Was on hold for 6 minutes.

## 2018-05-14 NOTE — H&P (Signed)
History and Physical    Chelsea Roberson WGN:[id number] DOB: July 29, 1945 DOA: 05/14/2018  PCP: Merlene Laughter, MD Consultants:  Lovey Newcomer - orthopedics; Sharl Ma - endocrinology; Claudie Fisherman - eyes Patient coming from:  Home - lives with husband; NOK: Husband, [phone]  Chief Complaint: Fall  HPI: Chelsea Roberson is a 72 y.o. female with medical history significant of hypothyroidism; HTN; HLD; and DM presenting with R hip fracture from a fall.  They were going to breakfast this AM.  Her husband started the car and she was walking off the porch like she does 3 times a day.  Her foot was wet and she slipped and fell on her right leg.  It hurts so much to move that she thought it was broken.  No prior hip fractures.  She was feeling very well prior to the fall.   ED Course:   R hip fracture from a mechanical fall.  Neurologically intact.  Ortho has seen, OR tomorrow.  Review of Systems: As per HPI; otherwise review of systems reviewed and negative.   Ambulatory Status:  Ambulates without assistance  Past Medical History:  Diagnosis Date  . Anxiety   . Arthritis   . Closed right hip fracture, initial encounter (HCC) 05/14/2018  . Complication of anesthesia   . Dementia (HCC)   . DM (diabetes mellitus), type 2 (HCC)   . GERD (gastroesophageal reflux disease)   . Hyperlipidemia   . Hypertension   . Hypothyroidism   . PONV (postoperative nausea and vomiting)     Past Surgical History:  Procedure Laterality Date  . COLONOSCOPY WITH PROPOFOL N/A 05/09/2016   Procedure: COLONOSCOPY WITH PROPOFOL;  Surgeon: Charolett Bumpers, MD;  Location: [facility];  Service: Endoscopy;  Laterality: N/A;  . PARTIAL HYSTERECTOMY      Social History   Socioeconomic History  . Marital status: Married    Spouse name: Not on file  . Number of children: Not on file  . Years of education: Not on file  . Highest education level: Not on file  Occupational History  . Occupation: Retired Merchant navy officer a Futures trader   Social Needs  . Financial resource strain: Not on file  . Food insecurity:    Worry: Not on file    Inability: Not on file  . Transportation needs:    Medical: Not on file    Non-medical: Not on file  Tobacco Use  . Smoking status: Never Smoker  . Smokeless tobacco: Never Used  Substance and Sexual Activity  . Alcohol use: No  . Drug use: No  . Sexual activity: Not on file  Lifestyle  . Physical activity:    Days per week: Not on file    Minutes per session: Not on file  . Stress: Not on file  Relationships  . Social connections:    Talks on phone: Not on file    Gets together: Not on file    Attends religious service: Not on file    Active member of club or organization: Not on file    Attends meetings of clubs or organizations: Not on file    Relationship status: Not on file  . Intimate partner violence:    Fear of current or ex partner: Not on file    Emotionally abused: Not on file    Physically abused: Not on file    Forced sexual activity: Not on file  Other Topics Concern  . Not on file  Social History  Narrative  . Not on file    Allergies  Allergen Reactions  . Aspirin-Caffeine Swelling and Other (See Comments)    SWELLING REACTION UNSPECIFIED   . Ciprofloxacin Other (See Comments)    HEADACHE   . Sulfa Antibiotics Nausea Only and Other (See Comments)    Family History  Problem Relation Age of Onset  . Failure to thrive Mother   . CAD Father        CAD diagnosed around 69, passed away at 40 of MI  . CAD Brother        Died in his 31s of MI, had had CABG about 2 years before he died    Prior to Admission medications   Medication Sig Start Date End Date Taking? Authorizing Provider  aspirin EC 81 MG EC tablet Take 1 tablet (81 mg total) by mouth daily. 02/20/15   Vassie Loll, MD  brimonidine-timolol (COMBIGAN) 0.2-0.5 % ophthalmic solution Place 1 drop into the right eye every 12 (twelve) hours.    [provider]  busPIRone (BUSPAR) 15 MG tablet Take 7.5-15 mg by mouth 2 (two) times daily. Take 0.5 tablet in morning and whole tablet at night    [provider]  CALCIUM PO Take 1 tablet by mouth at bedtime.     [provider]  carvedilol (COREG) 3.125 MG tablet Take 1 tablet (3.125 mg total) by mouth 2 (two) times daily with a meal. 02/20/15   Vassie Loll, MD  ibuprofen (ADVIL,MOTRIN) 200 MG tablet Take 400 mg by mouth every 6 (six) hours as needed for pain.    [provider]  levothyroxine (SYNTHROID, LEVOTHROID) 50 MCG tablet Take 50 mcg by mouth daily before breakfast.    [provider]  losartan-hydrochlorothiazide (HYZAAR) 50-12.5 MG per tablet Take 1 tablet by mouth daily.    [provider]  meclizine (ANTIVERT) 25 MG tablet Take 25 mg by mouth as needed for dizziness.  10/26/10   [provider]  metFORMIN (GLUCOPHAGE) 500 MG tablet Take 1,000 mg by mouth 2 (two) times daily with a meal.    [provider]  Multiple Vitamin (MULTIVITAMIN WITH MINERALS) TABS Take 1 tablet by mouth every other day.     [provider]  omeprazole (PRILOSEC) 20 MG capsule Take 1 capsule (20 mg total) by mouth 2 (two) times daily before a meal. 02/20/15   Vassie Loll, MD  pioglitazone (ACTOS) 30 MG tablet Take 30 mg by mouth daily.    [provider]  simvastatin (ZOCOR) 20 MG tablet Take 20 mg by mouth daily at 6 PM.    [provider]  trimethoprim (TRIMPEX) 100 MG tablet Take 1 tablet by mouth daily. 03/18/16   [provider]    Physical Exam: Vitals:   05/14/18 1245 05/14/18 1300 05/14/18 1315 05/14/18 1330  BP: 137/61 (!) 142/62 (!) 147/65 (!) 133/56  Pulse:      Resp: 18 17 (!) 21 19  Temp:      TempSrc:      SpO2:      Weight:      Height:         General:  Appears calm and comfortable and is NAD; screams out in pain when she tries to move her right hip - which she intermittently forgets she  can't move Eyes:  PERRL, EOMI, normal lids, iris ENT:  grossly normal hearing, lips & tongue, mmm Neck:  no LAD, masses or thyromegaly; no carotid bruits Cardiovascular:  RRR, no m/r/g. No LE edema.  Respiratory:   CTA bilaterally with no wheezes/rales/rhonchi.  Normal respiratory effort. Abdomen:  soft, NT, ND, NABS Back:   normal alignment, no CVAT Skin:  no rash or induration seen on limited exam Musculoskeletal:  R foot externally rotated with shortening; mild TTP over the hip itself without obvious deformity Lower extremity:  No LE edema.  Limited foot exam with no ulcerations.  2+ distal pulses. Psychiatric:  grossly normal mood and affect, speech fluent and appropriate, AOx2, some apparent mild cognitive impairment Neurologic:  CN 2-12 grossly intact, moves all extremities in coordinated fashion other than RLE, sensation intact    Radiological Exams on Admission: Dg Chest 1 View  Result Date: 05/14/2018 CLINICAL DATA:  Larey Seat today.  Right hip fracture. EXAM: CHEST  1 VIEW COMPARISON:  09/03/2017 FINDINGS: The cardiac silhouette, mediastinal and hilar contours are within normal limits and stable. The lungs are clear. No pleural effusion. The bony structures are intact. Suspect remote left humeral neck fracture. IMPRESSION: No acute cardiopulmonary findings and intact bony thorax. Electronically Signed   By: Rudie Meyer M.D.   On: 05/14/2018 10:31   Dg Hip Unilat With Pelvis 2-3 Views Right  Result Date: 05/14/2018 CLINICAL DATA:  Larey Seat today.  Right hip pain. EXAM: DG HIP (WITH OR WITHOUT PELVIS) 2-3V RIGHT COMPARISON:  Radiographs 11/11/2014 FINDINGS: There is a minimally displaced intertrochanteric fracture of the right hip. No femoral neck fracture. The bony pelvis is intact. No pelvic fractures. IMPRESSION: Mildly displaced intertrochanteric fracture of the right hip. Electronically Signed   By: Rudie Meyer M.D.   On: 05/14/2018 10:32   Dg Femur Min 2 Views Right  Result Date:  05/14/2018 CLINICAL DATA:  Larey Seat today.  Right leg pain. EXAM: RIGHT FEMUR 2 VIEWS COMPARISON:  None. FINDINGS: Intertrochanteric right hip fracture is noted. No femoral shaft fracture. IMPRESSION: Intertrochanteric fracture of the right hip. No femoral shaft fracture. Electronically Signed   By: Rudie Meyer M.D.   On: 05/14/2018 10:33    EKG: Independently reviewed.  NSR with rate 71; nonspecific ST changes with no evidence of acute ischemia   Labs on Admission: I have personally reviewed the available labs and imaging studies at the time of the admission.  Pertinent labs:   Glucose 146, BMP otherwise WNL Unremarkable CBC   Assessment/Plan Principal Problem:   Closed right hip fracture, initial encounter (HCC) Active Problems:   HTN (hypertension)   Hyperlipidemia   DM (diabetes mellitus), type 2 (HCC)   Hypothyroidism   Dementia (HCC)   R hip fracture -Mechanical fall resulting in hip fracture -Orthopedics consulted -NPO after midnight in anticipation of surgical repair tomorrow -SCDs overnight, start Lovenox post-operatively (or as per ortho) -Pain control with Robxain, Vicodin, and Morphine prn -SW consult for rehab placement - prefers [facility] in [place], if needed -Will need PT consult post-operatively -Hip fracture order set utilized  DM -Will check A1c -hold Glucophage, Actos -Cover with moderate-scale SSI  HTN -Continue Coreg  HLD -Continue Zocor  Hypothyroidism -Check TSH and free T4 -Continue Synthroid at current dose for now  Dementia -Mild -She does not appear to be taking medications for this issue  DVT prophylaxis:  SCDs until approved for Lovenox by orthopedics Code Status:  Full - confirmed with patient/family Family Communication: Husband was present for entire evaluation Disposition Plan:  Likely to need placement for rehab once clinically improved Consults called: Orthopedics; SW, Nutrition; will need PT post-operatively  Admission  status: Admit -  It is my clinical opinion that admission to INPATIENT is reasonable and necessary because of the expectation that this patient will require hospital care that crosses at least 2 midnights to treat this condition based on the medical complexity of the problems presented.  Given the aforementioned information, the predictability of an adverse outcome is felt to be significant.      Jonah BlueJennifer Kdyn Vonbehren MD [facility]  If note is complete, please contact covering daytime or nighttime physician. [email or web address] Password [email or web address]  05/14/2018, 2:08 PM

## 2018-05-14 NOTE — ED Provider Notes (Signed)
[facility] EMERGENCY DEPARTMENT Provider Note   CSN: [id number] Arrival date & time: 05/14/18  4098     History   Chief Complaint Chief Complaint  Patient presents with  . Fall  . Hip Pain    HPI Chelsea Roberson is a 72 y.o. female.  She with mechanical fall and now pain in the right hip.  Has not been able to ambulate since.  Did not hit her head, no loss consciousness.  Patient is not on blood thinners.  Pain mostly in the right hip and right thigh.  Denies any numbness and tingling on the legs.  No back pain.  The history is provided by the patient.  Hip Pain  This is a new problem. The current episode started 1 to 2 hours ago. The problem occurs constantly. The problem has not changed since onset.Pertinent negatives include no chest pain, no abdominal pain, no headaches and no shortness of breath. Nothing aggravates the symptoms. Nothing relieves the symptoms. She has tried nothing for the symptoms. The treatment provided no relief.    Past Medical History:  Diagnosis Date  . Anxiety   . Arthritis   . Complication of anesthesia   . DM (diabetes mellitus), type 2 (HCC)   . GERD (gastroesophageal reflux disease)   . Hyperlipidemia   . Hypertension   . Hypothyroidism   . PONV (postoperative nausea and vomiting)     Patient Active Problem List   Diagnosis Date Noted  . Pain in the chest   . Chest pain 02/19/2015  . HTN (hypertension) 02/19/2015  . Hyperlipidemia 02/19/2015  . DM (diabetes mellitus), type 2 (HCC) 02/19/2015  . Hypothyroidism 02/19/2015  . GERD (gastroesophageal reflux disease) 02/19/2015  . Anxiety 02/19/2015  . Dizziness 11/02/2014  . Palpitations 11/02/2014    Past Surgical History:  Procedure Laterality Date  . COLONOSCOPY WITH PROPOFOL N/A 05/09/2016   Procedure: COLONOSCOPY WITH PROPOFOL;  Surgeon: Charolett Bumpers, MD;  Location: [facility] ENDOSCOPY;  Service: Endoscopy;  Laterality: N/A;  . PARTIAL HYSTERECTOMY       OB  History   None      Home Medications    Prior to Admission medications   Medication Sig Start Date End Date Taking? Authorizing Provider  aspirin EC 81 MG EC tablet Take 1 tablet (81 mg total) by mouth daily. 02/20/15   Vassie Loll, MD  brimonidine-timolol (COMBIGAN) 0.2-0.5 % ophthalmic solution Place 1 drop into the right eye every 12 (twelve) hours.    [provider]  busPIRone (BUSPAR) 15 MG tablet Take 7.5-15 mg by mouth 2 (two) times daily. Take 0.5 tablet in morning and whole tablet at night    [provider]  CALCIUM PO Take 1 tablet by mouth at bedtime.     [provider]  carvedilol (COREG) 3.125 MG tablet Take 1 tablet (3.125 mg total) by mouth 2 (two) times daily with a meal. 02/20/15   Vassie Loll, MD  ibuprofen (ADVIL,MOTRIN) 200 MG tablet Take 400 mg by mouth every 6 (six) hours as needed for pain.    [provider]  levothyroxine (SYNTHROID, LEVOTHROID) 50 MCG tablet Take 50 mcg by mouth daily before breakfast.    [provider]  losartan-hydrochlorothiazide (HYZAAR) 50-12.5 MG per tablet Take 1 tablet by mouth daily.    [provider]  meclizine (ANTIVERT) 25 MG tablet Take 25 mg by mouth as needed for dizziness.  10/26/10   [provider]  metFORMIN (GLUCOPHAGE) 500  MG tablet Take 1,000 mg by mouth 2 (two) times daily with a meal.    [provider]  Multiple Vitamin (MULTIVITAMIN WITH MINERALS) TABS Take 1 tablet by mouth every other day.     [provider]  omeprazole (PRILOSEC) 20 MG capsule Take 1 capsule (20 mg total) by mouth 2 (two) times daily before a meal. 02/20/15   Vassie LollMadera, Carlos, MD  pioglitazone (ACTOS) 30 MG tablet Take 30 mg by mouth daily.    [provider]  simvastatin (ZOCOR) 20 MG tablet Take 20 mg by mouth daily at 6 PM.    [provider]  trimethoprim (TRIMPEX) 100 MG tablet Take 1 tablet by mouth daily. 03/18/16   [provider]     Family History Family History  Problem Relation Age of Onset  . Failure to thrive Mother   . CAD Father        CAD diagnosed around [age], passed away at 89 of MI  . CAD Brother        Died in his 3580s of MI, had had CABG about 2 years before he died    Social History Social History   Tobacco Use  . Smoking status: Never Smoker  . Smokeless tobacco: Never Used  Substance Use Topics  . Alcohol use: No  . Drug use: No     Allergies   Aspirin-caffeine and Sulfa antibiotics   Review of Systems Review of Systems  Constitutional: Negative for chills and fever.  HENT: Negative for ear pain and sore throat.   Eyes: Negative for pain and visual disturbance.  Respiratory: Negative for cough and shortness of breath.   Cardiovascular: Negative for chest pain and palpitations.  Gastrointestinal: Negative for abdominal pain and vomiting.  Genitourinary: Negative for dysuria and hematuria.  Musculoskeletal: Positive for arthralgias and gait problem. Negative for back pain.  Skin: Negative for color change and rash.  Neurological: Negative for seizures, syncope and headaches.  All other systems reviewed and are negative.    Physical Exam Updated Vital Signs BP (!) 167/81 (BP Location: Right Arm) Comment: Simultaneous filing. User may not have seen previous data.  Pulse 66 Comment: Simultaneous filing. User may not have seen previous data.  Temp 98.9 F (37.2 C) (Oral)   Resp 14 Comment: Simultaneous filing. User may not have seen previous data.  Ht 5\' 4"  (1.626 m)   Wt 59 kg   SpO2 99% Comment: Simultaneous filing. User may not have seen previous data.  BMI 22.33 kg/m   Physical Exam  Constitutional: She appears well-developed and well-nourished. No distress.  HENT:  Head: Normocephalic and atraumatic.  Eyes: Pupils are equal, round, and reactive to light. Conjunctivae and EOM are normal.  Neck: Normal range of motion. Neck supple.  Cardiovascular: Normal rate, regular  rhythm, normal heart sounds and intact distal pulses.  No murmur heard. Pulmonary/Chest: Effort normal and breath sounds normal. No respiratory distress.  Abdominal: Soft. There is no tenderness.  Musculoskeletal: She exhibits tenderness (TTP to right hip and right thigh ). She exhibits no edema.  Patient able to flex and extend her right lower leg at the knee, pain with range of motion at the hip, no midline spinal pain   Neurological: She is alert. No cranial nerve deficit or sensory deficit. She exhibits normal muscle tone. Coordination normal.  Skin: Skin is warm and dry. Capillary refill takes less than 2 seconds.  Psychiatric: She has a normal mood and affect.  Nursing note and  vitals reviewed.    ED Treatments / Results  Labs (all labs ordered are listed, but only abnormal results are displayed) Labs Reviewed  CBC WITH DIFFERENTIAL/PLATELET  CBC WITH DIFFERENTIAL/PLATELET  BASIC METABOLIC PANEL    EKG EKG Interpretation  Date/Time:  Tuesday May 14 2018 09:14:05 EST Ventricular Rate:  71 PR Interval:    QRS Duration: 98 QT Interval:  417 QTC Calculation: 454 R Axis:   87 Text Interpretation:  Sinus rhythm Borderline right axis deviation Borderline low voltage, extremity leads Confirmed by Virgina Norfolk [phone]) on 05/14/2018 9:16:18 AM   Radiology Dg Chest 1 View  Result Date: 05/14/2018 CLINICAL DATA:  Larey Seat today.  Right hip fracture. EXAM: CHEST  1 VIEW COMPARISON:  09/03/2017 FINDINGS: The cardiac silhouette, mediastinal and hilar contours are within normal limits and stable. The lungs are clear. No pleural effusion. The bony structures are intact. Suspect remote left humeral neck fracture. IMPRESSION: No acute cardiopulmonary findings and intact bony thorax. Electronically Signed   By: Rudie Meyer M.D.   On: 05/14/2018 10:31   Dg Hip Unilat With Pelvis 2-3 Views Right  Result Date: 05/14/2018 CLINICAL DATA:  Larey Seat today.  Right hip pain. EXAM: DG HIP (WITH OR  WITHOUT PELVIS) 2-3V RIGHT COMPARISON:  Radiographs 11/11/2014 FINDINGS: There is a minimally displaced intertrochanteric fracture of the right hip. No femoral neck fracture. The bony pelvis is intact. No pelvic fractures. IMPRESSION: Mildly displaced intertrochanteric fracture of the right hip. Electronically Signed   By: Rudie Meyer M.D.   On: 05/14/2018 10:32   Dg Femur Min 2 Views Right  Result Date: 05/14/2018 CLINICAL DATA:  Larey Seat today.  Right leg pain. EXAM: RIGHT FEMUR 2 VIEWS COMPARISON:  None. FINDINGS: Intertrochanteric right hip fracture is noted. No femoral shaft fracture. IMPRESSION: Intertrochanteric fracture of the right hip. No femoral shaft fracture. Electronically Signed   By: Rudie Meyer M.D.   On: 05/14/2018 10:33    Procedures Procedures (including critical care time)  Medications Ordered in ED Medications  fentaNYL (SUBLIMAZE) injection 50 mcg (50 mcg Intravenous Given 05/14/18 0932)     Initial Impression / Assessment and Plan / ED Course  I have reviewed the triage vital signs and the nursing notes.  Pertinent labs & imaging results that were available during my care of the patient were reviewed by me and considered in my medical decision making (see chart for details).     THAO BAUZA is a 72 year old female history of hypertension, high cholesterol, diabetes who presents to the ED with right hip pain following a fall.  Patient with normal vitals.  No fever.  Patient with pain in the right hip after she stepped off awkwardly while walking.  Patient is neurovascularly intact on exam.  Has no tenderness below the thigh.  Concern for hip fracture.  Patient did not hit her head and no loss of consciousness.  Patient with no neck pain.  No midline spinal pain.  Patient is not on any blood thinners.  X-rays were obtained that showed right hip fracture.  Orthopedics was consulted and came down to the ED to evaluate the patient.  They will take the patient to the OR  tomorrow morning.  Patient to be admitted to hospitalist for further care.  Patient given IV fentanyl with improvement of pain.  Patient with screening labs ordered, EKG ordered that was overall unremarkable.  This chart was dictated using voice recognition software.  Despite best efforts to proofread,  errors can occur which  can change the documentation meaning.   Final Clinical Impressions(s) / ED Diagnoses   Final diagnoses:  Closed fracture of right hip, initial encounter [facility])    ED Discharge Orders    None       Virgina Norfolk, DO 05/14/18 1132

## 2018-05-14 NOTE — ED Triage Notes (Signed)
Pt arrives to ED from home with complaints of a fall from a flight of stairs. EMS reports pt's right hip is rotated outwards and shortened. Pt received fentanyl. Pt placed in position of comfort with bed locked and lowered, call bell in reach.

## 2018-05-14 NOTE — ED Notes (Signed)
Hospitalist bedside for evaluation and planning. Consent form filled out and placed bedside, no signatures obtained.

## 2018-05-14 NOTE — ED Notes (Signed)
Called 5N to give report.  Receiving RN unable to take report at this time. 

## 2018-05-15 ENCOUNTER — Encounter (HOSPITAL_COMMUNITY): Payer: Self-pay | Admitting: Certified Registered Nurse Anesthetist

## 2018-05-15 ENCOUNTER — Inpatient Hospital Stay (HOSPITAL_COMMUNITY): Payer: Medicare Other | Admitting: Certified Registered Nurse Anesthetist

## 2018-05-15 ENCOUNTER — Encounter (HOSPITAL_COMMUNITY)
Admission: EM | Disposition: A | Discharge status: Skilled Nursing Facility | Payer: Self-pay | Source: Home / Self Care | Attending: Family Medicine

## 2018-05-15 ENCOUNTER — Inpatient Hospital Stay (HOSPITAL_COMMUNITY): Payer: Medicare Other

## 2018-05-15 HISTORY — PX: FEMUR IM NAIL: SHX1597

## 2018-05-15 LAB — TSH: TSH: 2.061 u[IU]/mL (ref 0.350–4.500)

## 2018-05-15 LAB — HEMOGLOBIN A1C
Hgb A1c MFr Bld: 6 % — ABNORMAL HIGH (ref 4.8–5.6)
Mean Plasma Glucose: 125.5 mg/dL

## 2018-05-15 LAB — GLUCOSE, CAPILLARY
GLUCOSE-CAPILLARY: 154 mg/dL — AB (ref 70–99)
GLUCOSE-CAPILLARY: 168 mg/dL — AB (ref 70–99)
Glucose-Capillary: 109 mg/dL — ABNORMAL HIGH (ref 70–99)
Glucose-Capillary: 128 mg/dL — ABNORMAL HIGH (ref 70–99)
Glucose-Capillary: 101 mg/dL — ABNORMAL HIGH (ref 70–99)
Glucose-Capillary: 102 mg/dL — ABNORMAL HIGH (ref 70–99)
Glucose-Capillary: 169 mg/dL — ABNORMAL HIGH (ref 70–99)

## 2018-05-15 LAB — T4, FREE: Free T4: 0.96 ng/dL (ref 0.82–1.77)

## 2018-05-15 SURGERY — INSERTION, INTRAMEDULLARY ROD, FEMUR
Anesthesia: General | Site: Hip | Laterality: Right

## 2018-05-15 MED ORDER — LIDOCAINE 2% (20 MG/ML) 5 ML SYRINGE
INTRAMUSCULAR | Status: DC | PRN
  Administered 2018-05-15: 60 mg via INTRAVENOUS

## 2018-05-15 MED ORDER — GLYCOPYRROLATE PF 0.2 MG/ML IJ SOSY
PREFILLED_SYRINGE | INTRAMUSCULAR | Status: DC | PRN
  Administered 2018-05-15: 0.4 mg via INTRAVENOUS

## 2018-05-15 MED ORDER — SCOPOLAMINE 1 MG/3DAYS TD PT72
MEDICATED_PATCH | TRANSDERMAL | Status: AC
  Filled 2018-05-15: qty 1

## 2018-05-15 MED ORDER — PROPOFOL 10 MG/ML IV BOLUS
INTRAVENOUS | Status: DC | PRN
  Administered 2018-05-15: 120 mg via INTRAVENOUS

## 2018-05-15 MED ORDER — DEXAMETHASONE SODIUM PHOSPHATE 10 MG/ML IJ SOLN
INTRAMUSCULAR | Status: DC | PRN
  Administered 2018-05-15: 10 mg via INTRAVENOUS

## 2018-05-15 MED ORDER — LIDOCAINE 2% (20 MG/ML) 5 ML SYRINGE
INTRAMUSCULAR | Status: AC
  Filled 2018-05-15: qty 5

## 2018-05-15 MED ORDER — ROCURONIUM BROMIDE 50 MG/5ML IV SOSY
PREFILLED_SYRINGE | INTRAVENOUS | Status: AC
  Filled 2018-05-15: qty 5

## 2018-05-15 MED ORDER — BRIMONIDINE TARTRATE 0.2 % OP SOLN
1.0000 [drp] | Freq: Two times a day (BID) | OPHTHALMIC | Status: DC
  Administered 2018-05-16 – 2018-05-20 (×9): 1 [drp] via OPHTHALMIC
  Filled 2018-05-15 (×2): qty 5

## 2018-05-15 MED ORDER — ONDANSETRON HCL 4 MG/2ML IJ SOLN
INTRAMUSCULAR | Status: DC | PRN
  Administered 2018-05-15: 4 mg via INTRAVENOUS

## 2018-05-15 MED ORDER — ENSURE ENLIVE PO LIQD
237.0000 mL | Freq: Two times a day (BID) | ORAL | Status: DC
  Administered 2018-05-15 – 2018-05-20 (×9): 237 mL via ORAL

## 2018-05-15 MED ORDER — PROMETHAZINE HCL 25 MG/ML IJ SOLN: 6.2500 mg | INTRAMUSCULAR | Status: DC | PRN

## 2018-05-15 MED ORDER — ROCURONIUM BROMIDE 10 MG/ML (PF) SYRINGE
PREFILLED_SYRINGE | INTRAVENOUS | Status: DC | PRN
  Administered 2018-05-15: 50 mg via INTRAVENOUS

## 2018-05-15 MED ORDER — FENTANYL CITRATE (PF) 100 MCG/2ML IJ SOLN
INTRAMUSCULAR | Status: DC | PRN
  Administered 2018-05-15 (×2): 50 ug via INTRAVENOUS
  Administered 2018-05-15: 100 ug via INTRAVENOUS
  Administered 2018-05-15: 50 ug via INTRAVENOUS

## 2018-05-15 MED ORDER — LACTATED RINGERS IV SOLN
INTRAVENOUS | Status: DC
  Administered 2018-05-15 – 2018-05-17 (×3): via INTRAVENOUS

## 2018-05-15 MED ORDER — FENTANYL CITRATE (PF) 250 MCG/5ML IJ SOLN
INTRAMUSCULAR | Status: AC
  Filled 2018-05-15: qty 5

## 2018-05-15 MED ORDER — CEFAZOLIN SODIUM-DEXTROSE 2-4 GM/100ML-% IV SOLN
2.0000 g | Freq: Three times a day (TID) | INTRAVENOUS | Status: AC
  Administered 2018-05-15 – 2018-05-16 (×3): 2 g via INTRAVENOUS
  Filled 2018-05-15 (×3): qty 100

## 2018-05-15 MED ORDER — TIMOLOL MALEATE 0.5 % OP SOLN
1.0000 [drp] | Freq: Two times a day (BID) | OPHTHALMIC | Status: DC
  Administered 2018-05-16 – 2018-05-20 (×9): 1 [drp] via OPHTHALMIC
  Filled 2018-05-15 (×2): qty 5

## 2018-05-15 MED ORDER — INSULIN ASPART 100 UNIT/ML ~~LOC~~ SOLN
0.0000 [IU] | Freq: Every day | SUBCUTANEOUS | Status: DC
  Administered 2018-05-16: 2 [IU] via SUBCUTANEOUS

## 2018-05-15 MED ORDER — GLYCOPYRROLATE PF 0.2 MG/ML IJ SOSY
PREFILLED_SYRINGE | INTRAMUSCULAR | Status: AC
  Filled 2018-05-15: qty 2

## 2018-05-15 MED ORDER — SCOPOLAMINE 1 MG/3DAYS TD PT72
MEDICATED_PATCH | TRANSDERMAL | Status: DC | PRN
  Administered 2018-05-15: 1 via TRANSDERMAL

## 2018-05-15 MED ORDER — NEOSTIGMINE METHYLSULFATE 3 MG/3ML IV SOSY
PREFILLED_SYRINGE | INTRAVENOUS | Status: AC
  Filled 2018-05-15: qty 3

## 2018-05-15 MED ORDER — VANCOMYCIN HCL 1000 MG IV SOLR
INTRAVENOUS | Status: AC
  Filled 2018-05-15: qty 1000

## 2018-05-15 MED ORDER — INSULIN ASPART 100 UNIT/ML ~~LOC~~ SOLN
0.0000 [IU] | Freq: Three times a day (TID) | SUBCUTANEOUS | Status: DC
  Administered 2018-05-15 – 2018-05-16 (×2): 2 [IU] via SUBCUTANEOUS
  Administered 2018-05-16: 3 [IU] via SUBCUTANEOUS
  Administered 2018-05-16: 5 [IU] via SUBCUTANEOUS
  Administered 2018-05-17 (×2): 3 [IU] via SUBCUTANEOUS
  Administered 2018-05-17: 1 [IU] via SUBCUTANEOUS
  Administered 2018-05-18: 3 [IU] via SUBCUTANEOUS
  Administered 2018-05-18 (×2): 2 [IU] via SUBCUTANEOUS
  Administered 2018-05-19: 1 [IU] via SUBCUTANEOUS
  Administered 2018-05-19: 2 [IU] via SUBCUTANEOUS
  Administered 2018-05-19: 5 [IU] via SUBCUTANEOUS
  Administered 2018-05-20: 2 [IU] via SUBCUTANEOUS
  Administered 2018-05-20: 5 [IU] via SUBCUTANEOUS

## 2018-05-15 MED ORDER — ENOXAPARIN SODIUM 40 MG/0.4ML ~~LOC~~ SOLN
40.0000 mg | SUBCUTANEOUS | Status: DC
  Administered 2018-05-16 – 2018-05-20 (×5): 40 mg via SUBCUTANEOUS
  Filled 2018-05-15 (×5): qty 0.4

## 2018-05-15 MED ORDER — ONDANSETRON HCL 4 MG/2ML IJ SOLN
INTRAMUSCULAR | Status: AC
  Filled 2018-05-15: qty 2

## 2018-05-15 MED ORDER — 0.9 % SODIUM CHLORIDE (POUR BTL) OPTIME
TOPICAL | Status: DC | PRN
  Administered 2018-05-15: 1000 mL

## 2018-05-15 MED ORDER — SENNOSIDES-DOCUSATE SODIUM 8.6-50 MG PO TABS
2.0000 | ORAL_TABLET | Freq: Two times a day (BID) | ORAL | Status: DC
  Administered 2018-05-15 – 2018-05-20 (×10): 2 via ORAL
  Filled 2018-05-15 (×10): qty 2

## 2018-05-15 MED ORDER — NEOSTIGMINE METHYLSULFATE 5 MG/5ML IV SOSY
PREFILLED_SYRINGE | INTRAVENOUS | Status: DC | PRN
  Administered 2018-05-15: 3 mg via INTRAVENOUS

## 2018-05-15 MED ORDER — DEXAMETHASONE SODIUM PHOSPHATE 10 MG/ML IJ SOLN
INTRAMUSCULAR | Status: AC
  Filled 2018-05-15: qty 1

## 2018-05-15 MED ORDER — VANCOMYCIN HCL 500 MG IV SOLR
INTRAVENOUS | Status: AC
  Filled 2018-05-15: qty 500

## 2018-05-15 MED ORDER — HYDROMORPHONE HCL 1 MG/ML IJ SOLN: 0.2500 mg | INTRAMUSCULAR | Status: DC | PRN

## 2018-05-15 MED ORDER — OXYCODONE HCL 5 MG/5ML PO SOLN: 5.0000 mg | Freq: Once | ORAL | Status: DC | PRN

## 2018-05-15 MED ORDER — OXYCODONE HCL 5 MG PO TABS: 5.0000 mg | ORAL_TABLET | Freq: Once | ORAL | Status: DC | PRN

## 2018-05-15 MED ORDER — VANCOMYCIN HCL 500 MG IV SOLR
INTRAVENOUS | Status: DC | PRN
  Administered 2018-05-15: 500 mg via TOPICAL

## 2018-05-15 MED ORDER — HYDRALAZINE HCL 20 MG/ML IJ SOLN: 10.0000 mg | Freq: Four times a day (QID) | INTRAMUSCULAR | Status: DC | PRN

## 2018-05-15 MED ORDER — ADULT MULTIVITAMIN W/MINERALS CH
1.0000 | ORAL_TABLET | Freq: Every day | ORAL | Status: DC
  Administered 2018-05-15 – 2018-05-20 (×6): 1 via ORAL
  Filled 2018-05-15 (×6): qty 1

## 2018-05-15 SURGICAL SUPPLY — 56 items
ADH SKN CLS APL DERMABOND .7 (GAUZE/BANDAGES/DRESSINGS) ×1
BANDAGE ACE 4X5 VEL STRL LF (GAUZE/BANDAGES/DRESSINGS) ×3 IMPLANT
BANDAGE ACE 6X5 VEL STRL LF (GAUZE/BANDAGES/DRESSINGS) IMPLANT
BANDAGE ELASTIC 6 VELCRO ST LF (GAUZE/BANDAGES/DRESSINGS) ×3 IMPLANT
BIT DRILL FLUTED FEMUR 4.2/3 (BIT) ×2 IMPLANT
BLADE HELICAL TFNA 90 (Anchor) ×2 IMPLANT
BLADE SURG 10 STRL SS (BLADE) ×6 IMPLANT
BNDG COHESIVE 4X5 TAN STRL (GAUZE/BANDAGES/DRESSINGS) ×3 IMPLANT
BNDG COHESIVE 6X5 TAN STRL LF (GAUZE/BANDAGES/DRESSINGS) IMPLANT
BRUSH SCRUB SURG 4.25 DISP (MISCELLANEOUS) ×6 IMPLANT
CHLORAPREP W/TINT 26ML (MISCELLANEOUS) ×3 IMPLANT
COVER SURGICAL LIGHT HANDLE (MISCELLANEOUS) ×3 IMPLANT
COVER WAND RF STERILE (DRAPES) ×3 IMPLANT
DERMABOND ADVANCED (GAUZE/BANDAGES/DRESSINGS) ×2
DERMABOND ADVANCED .7 DNX12 (GAUZE/BANDAGES/DRESSINGS) IMPLANT
DRAPE C-ARM 35X43 STRL (DRAPES) ×3 IMPLANT
DRAPE C-ARMOR (DRAPES) ×3 IMPLANT
DRAPE HALF SHEET 40X57 (DRAPES) ×6 IMPLANT
DRAPE IMP U-DRAPE 54X76 (DRAPES) ×6 IMPLANT
DRAPE INCISE IOBAN 66X45 STRL (DRAPES) ×3 IMPLANT
DRAPE ORTHO SPLIT 77X108 STRL (DRAPES) ×6
DRAPE STERI IOBAN 125X83 (DRAPES) ×2 IMPLANT
DRAPE SURG 17X23 STRL (DRAPES) ×3 IMPLANT
DRAPE SURG ORHT 6 SPLT 77X108 (DRAPES) ×2 IMPLANT
DRAPE U-SHAPE 47X51 STRL (DRAPES) ×3 IMPLANT
DRSG MEPILEX BORDER 4X4 (GAUZE/BANDAGES/DRESSINGS) ×7 IMPLANT
DRSG MEPILEX BORDER 4X8 (GAUZE/BANDAGES/DRESSINGS) ×3 IMPLANT
ELECT REM PT RETURN 9FT ADLT (ELECTROSURGICAL) ×3
ELECTRODE REM PT RTRN 9FT ADLT (ELECTROSURGICAL) ×1 IMPLANT
GLOVE BIO SURGEON STRL SZ7.5 (GLOVE) ×9 IMPLANT
GLOVE BIOGEL PI IND STRL 7.5 (GLOVE) ×1 IMPLANT
GLOVE BIOGEL PI INDICATOR 7.5 (GLOVE) ×2
GOWN STRL REUS W/ TWL LRG LVL3 (GOWN DISPOSABLE) ×3 IMPLANT
GOWN STRL REUS W/TWL LRG LVL3 (GOWN DISPOSABLE) ×9
GOWN STRL REUS W/TWL XL LVL3 (GOWN DISPOSABLE) ×3 IMPLANT
GUIDEWIRE 3.2X400 (WIRE) ×4 IMPLANT
KIT BASIN OR (CUSTOM PROCEDURE TRAY) ×3 IMPLANT
KIT TURNOVER KIT B (KITS) ×3 IMPLANT
MANIFOLD NEPTUNE II (INSTRUMENTS) ×3 IMPLANT
NAIL TROCH FIX 10X170 130 (Nail) ×3 IMPLANT
NS IRRIG 1000ML POUR BTL (IV SOLUTION) ×3 IMPLANT
PACK GENERAL/GYN (CUSTOM PROCEDURE TRAY) ×3 IMPLANT
PAD ARMBOARD 7.5X6 YLW CONV (MISCELLANEOUS) ×6 IMPLANT
SCREW LOCK STAR 5X38 (Screw) ×2 IMPLANT
STAPLER VISISTAT 35W (STAPLE) ×3 IMPLANT
STOCKINETTE IMPERVIOUS LG (DRAPES) ×3 IMPLANT
SUT ETHILON 3 0 PS 1 (SUTURE) ×3 IMPLANT
SUT MNCRL AB 3-0 PS2 18 (SUTURE) ×3 IMPLANT
SUT VIC AB 0 CT1 27 (SUTURE) ×3
SUT VIC AB 0 CT1 27XBRD ANBCTR (SUTURE) IMPLANT
SUT VIC AB 2-0 CT1 27 (SUTURE) ×6
SUT VIC AB 2-0 CT1 TAPERPNT 27 (SUTURE) ×2 IMPLANT
TOWEL OR 17X24 6PK STRL BLUE (TOWEL DISPOSABLE) ×1 IMPLANT
TOWEL OR 17X26 10 PK STRL BLUE (TOWEL DISPOSABLE) ×6 IMPLANT
UNDERPAD 30X30 (UNDERPADS AND DIAPERS) ×3 IMPLANT
WATER STERILE IRR 1000ML POUR (IV SOLUTION) ×3 IMPLANT

## 2018-05-15 NOTE — Transfer of Care (Signed)
Immediate Anesthesia Transfer of Care Note  Patient: Chelsea Roberson  Procedure(s) Performed: INTRAMEDULLARY (IM) NAIL FEMORAL (Right Hip)  Patient Location: PACU  Anesthesia Type:General  Level of Consciousness: patient cooperative and responds to stimulation  Airway & Oxygen Therapy: Patient Spontanous Breathing and Patient connected to nasal cannula oxygen  Post-op Assessment: Report given to RN and Post -op Vital signs reviewed and stable  Post vital signs: Reviewed and stable  Last Vitals:  Vitals Value Taken Time  BP 131/78 05/15/2018  3:43 PM  Temp    Pulse 91 05/15/2018  3:47 PM  Resp 26 05/15/2018  3:47 PM  SpO2 97 % 05/15/2018  3:47 PM  Vitals shown include unvalidated device data.  Last Pain:  Vitals:   05/15/18 1011  TempSrc:   PainSc: 4       Patients Stated Pain Goal: 0 (05/14/18 0911)  Complications: No apparent anesthesia complications

## 2018-05-15 NOTE — Progress Notes (Signed)
Initial Nutrition Assessment  DOCUMENTATION CODES:   Not applicable  INTERVENTION:    Ensure Enlive po BID, each supplement provides 350 kcal and 20 grams of protein  Multivitamin daily  NUTRITION DIAGNOSIS:   Increased nutrient needs related to post-op healing as evidenced by estimated needs.  GOAL:   Patient will meet greater than or equal to 90% of their needs  MONITOR:   PO intake, Supplement acceptance, Labs  REASON FOR ASSESSMENT:   Consult Hip fracture protocol  ASSESSMENT:   72 yo female with PMH of DM, HTN, HLD, and hypothyroidism who was admitted on 12/3 s/p fall with R hip fracture.  Patient had a lot of family in her room during RD visit. She reports intake was okay PTA, but she doesn't eat a lot because she just doesn't feel like eating. Weight has been stable recently. She lost "a lot of weight" a few years ago by changing her diet when she was diagnosed with diabetes. She loves chocolate Ensure supplements and agreed to drink them between meals.  Plans for hip fracture repair surgery today. Suspect intake will be insufficient to meet increased needs for healing after surgery. Will add Ensure BID.   Labs reviewed. CBG's: 109-128 A1C: 6 Medications reviewed.   NUTRITION - FOCUSED PHYSICAL EXAM:    Most Recent Value  Orbital Region  No depletion  Upper Arm Region  No depletion  Thoracic and Lumbar Region  No depletion  Buccal Region  No depletion  Temple Region  Mild depletion  Clavicle Bone Region  Mild depletion  Clavicle and Acromion Bone Region  No depletion  Scapular Bone Region  Unable to assess  Dorsal Hand  Moderate depletion  Patellar Region  No depletion  Anterior Thigh Region  No depletion  Posterior Calf Region  No depletion  Edema (RD Assessment)  None  Hair  Reviewed  Eyes  Reviewed  Mouth  Reviewed  Skin  Reviewed  Nails  Reviewed       Diet Order:   Diet Order            Diet NPO time specified  Diet effective now               EDUCATION NEEDS:   No education needs have been identified at this time  Skin:  Skin Assessment: Reviewed RN Assessment  Last BM:  12/3  Height:   Ht Readings from Last 1 Encounters:  05/14/18 5\' 4"  (1.626 m)    Weight:   Wt Readings from Last 1 Encounters:  05/14/18 59 kg    Ideal Body Weight:  54.5 kg  BMI:  Body mass index is 22.33 kg/m.  Estimated Nutritional Needs:   Kcal:  1500-1700  Protein:  75-85 gm  Fluid:  1.5 L    Joaquin CourtsKimberly Harris, RD, LDN, CNSC Pager [phone] After Hours Pager [phone]

## 2018-05-15 NOTE — Plan of Care (Signed)
  Problem: Coping: Goal: Level of anxiety will decrease Outcome: Progressing   Problem: Pain Managment: Goal: General experience of comfort will improve Outcome: Progressing   Problem: Safety: Goal: Ability to remain free from injury will improve Outcome: Progressing   

## 2018-05-15 NOTE — Progress Notes (Signed)
MEDICATION RELATED NOTE   Pharmacy Re:  Medication History List  Assessment: Pharmacy has attempted to verify home medications for this patient.  She was unable to provide an accurate history and requested that we call her physician to obtain.  We received a fax from Dr. Laverle HobbyStoneking's office and have added these to her list.  We have also included her fill history from a retail pharmacy.     Plan:  Please review her medications and resume those meds you feel are pertinent to her care at this time.  Nadara MustardNita Ishanvi Mcquitty, PharmD., MS Clinical Pharmacist Pager:  [phone] Thank you for allowing pharmacy to be part of this patients care team. 05/15/2018,1:11 PM  Medications:  Medications Prior to Admission  Medication Sig Dispense Refill Last Dose  . acetaminophen (TYLENOL) 650 MG CR tablet Take 650 mg by mouth every 8 (eight) hours as needed for pain.     Marland Kitchen. alendronate (FOSAMAX) 70 MG tablet Take 70 mg by mouth once a week.  8   . busPIRone (BUSPAR) 15 MG tablet Take 7.5-15 mg by mouth See admin instructions. 7.5mg  in the morning and 15mg  at bedtime   05/09/2016 at 0800  . calcium-vitamin D (OSCAL WITH D) 500-200 MG-UNIT tablet Take 1 tablet by mouth daily with breakfast.     . carvedilol (COREG) 3.125 MG tablet Take 1 tablet (3.125 mg total) by mouth 2 (two) times daily with a meal. 60 tablet 2 05/09/2016 at 0900  . Cholecalciferol (VITAMIN D3) 50 MCG (2000 UT) capsule Take 2,000 Units by mouth daily.     Marland Kitchen. levothyroxine (SYNTHROID, LEVOTHROID) 50 MCG tablet Take 50 mcg by mouth daily before breakfast.   05/09/2016 at 0700  . losartan-hydrochlorothiazide (HYZAAR) 50-12.5 MG per tablet Take 1 tablet by mouth daily.   05/09/2016 at 0800  . metFORMIN (GLUCOPHAGE) 500 MG tablet Take 1,000 mg by mouth 2 (two) times daily with a meal.   Past Week at Unknown time  . Multiple Vitamin (MULTIVITAMIN WITH MINERALS) TABS Take 1 tablet by mouth every other day.    Past Week at Unknown time  . MYRBETRIQ 50  MG TB24 tablet Take 50 mg by mouth daily.  11   . NAMZARIC 28-10 MG CP24 Take 28 mg by mouth every evening.  11   . pioglitazone (ACTOS) 30 MG tablet Take 30 mg by mouth daily.   Past Week at Unknown time  . ranitidine (ZANTAC) 150 MG tablet Take 150 mg by mouth daily as needed for heartburn.     . simvastatin (ZOCOR) 20 MG tablet Take 20 mg by mouth daily at 6 PM.   05/08/2016 at 1900  . traZODone (DESYREL) 50 MG tablet Take 25 mg by mouth at bedtime.   1   . trimethoprim (TRIMPEX) 100 MG tablet Take 100 mg by mouth daily.      Marland Kitchen. aspirin EC 81 MG EC tablet Take 1 tablet (81 mg total) by mouth daily.    [facility]  . brimonidine-timolol (COMBIGAN) 0.2-0.5 % ophthalmic solution Place 1 drop into the right eye every 12 (twelve) hours.    [facility]  . omeprazole (PRILOSEC) 20 MG capsule Take 1 capsule (20 mg total) by mouth 2 (two) times daily before a meal. (Patient not taking: Reported on 05/15/2018)   Not Taking at Unknown time

## 2018-05-15 NOTE — Progress Notes (Signed)
Patient Demographics:    Chelsea Roberson, is a 72 y.o. female, DOB - 04-Jul-1945, ZOX:[id number]  Admit date - 05/14/2018   Admitting Physician Jonah Blue, MD  Outpatient Primary MD for the patient is Merlene Laughter, MD  LOS - 1   Chief Complaint  Patient presents with  . Fall  . Hip Pain        Subjective:    Chelsea Roberson today has no fevers, no emesis,  No chest pain, right hip pain with range of motion  Assessment  & Plan :    Principal Problem:   Closed right hip fracture, initial encounter (HCC) Active Problems:   HTN (hypertension)   Hyperlipidemia   DM (diabetes mellitus), type 2 (HCC)   Hypothyroidism   Dementia [facility])  Brief Summary 72 y.o. female with medical history significant of hypothyroidism; HTN; HLD; and DM admitted on 05/14/2018 with right hip pain/right hip fracture after mechanical fall   Plan:- 1)DM2-A1c 6.0, reflecting excellent diabetic control, prior to admission patient was on metformin/Actos, hold oral diabetic medications, use sliding scale for now, patient will be n.p.o. for right hip surgery so allow some permissive Hyperglycemia rather than risk life-threatening hypoglycemia in a patient with unreliable oral intake. Use Novolog/Humalog Sliding scale insulin with Accu-Cheks/Fingersticks as ordered  2)HTN-stable, okay to resume Coreg 12.5 mg twice daily, losartan/HCTZ,   may use IV Hydralazine 10 mg  Every 4 hours Prn for systolic blood pressure over 170 mmhg  3)Hypothyroidism-patient appears euthyroid, TSH is 2.0, continue levothyroxine 50 mcg daily  4)Anxiety/Insomnia--- stable, continue buspirone, also continue trazodone for sleep  5)Rt Hip Fx--status post Mechanical Fall, ORIF as per orthopedic team  Disposition/Need for in-Hospital Stay- patient unable to be discharged at this time due to due to right hip fracture requiring surgical correction, patient  will most likely need placement to SNF rehab post-op   Code Status : full   Disposition Plan  : SNF rehab   Consults  :  ortho   DVT Prophylaxis  :   As per ortho SCDs  Lab Results  Component Value Date   PLT 180 05/14/2018    Inpatient Medications  Scheduled Meds: . [MAR Hold] brimonidine  1 drop Both Eyes BID   And  . [MAR Hold] timolol  1 drop Both Eyes BID  . [MAR Hold] busPIRone  7.5 mg Oral Daily   And  . [MAR Hold] busPIRone  15 mg Oral QHS  . [MAR Hold] carvedilol  3.125 mg Oral BID WC  . [MAR Hold] feeding supplement (ENSURE ENLIVE)  237 mL Oral BID BM  . [MAR Hold] losartan  50 mg Oral Daily   And  . [MAR Hold] hydrochlorothiazide  12.5 mg Oral Daily  . [MAR Hold] insulin aspart  0-5 Units Subcutaneous QHS  . [MAR Hold] insulin aspart  0-5 Units Subcutaneous QHS  . [MAR Hold] insulin aspart  0-9 Units Subcutaneous TID WC  . [MAR Hold] levothyroxine  50 mcg Oral QAC breakfast  . [MAR Hold] multivitamin with minerals  1 tablet Oral Daily  . [MAR Hold] pantoprazole  40 mg Oral Daily  . [MAR Hold] povidone-iodine  2 application Topical Once  . [MAR Hold] senna-docusate  2 tablet Oral BID  . [facility] Hold]  simvastatin  20 mg Oral q1800  . [MAR Hold] traZODone  50 mg Oral QHS  . [MAR Hold] trimethoprim  100 mg Oral Daily   Continuous Infusions: . lactated ringers 10 mL/hr at 05/15/18 1240  . [MAR Hold] methocarbamol (ROBAXIN) IV     PRN Meds:.[MAR Hold] bisacodyl, [MAR Hold] HYDROcodone-acetaminophen, [MAR Hold] methocarbamol **OR** [MAR Hold] methocarbamol (ROBAXIN) IV, [MAR Hold]  morphine injection, [MAR Hold] polyethylene glycol    Anti-infectives (From admission, onward)   Start     Dose/Rate Route Frequency Ordered Stop   05/15/18 1445  vancomycin (VANCOCIN) powder  Status:  Discontinued       As needed 05/15/18 1445 05/15/18 1509   05/15/18 1045  [MAR Hold]  ceFAZolin (ANCEF) IVPB 2g/100 mL premix     (MAR Hold since Wed 05/15/2018 at 1220. Reason: Transfer  to a Procedural area.)   2 g 200 mL/hr over 30 Minutes Intravenous On call to O.R. 05/14/18 1923 05/15/18 1414   05/15/18 1000  [MAR Hold]  trimethoprim (TRIMPEX) tablet 100 mg     (MAR Hold since Wed 05/15/2018 at 1220. Reason: Transfer to a Procedural area.)   100 mg Oral Daily 05/14/18 1406     05/14/18 1145  ceFAZolin (ANCEF) IVPB 2g/100 mL premix  Status:  Discontinued     2 g 200 mL/hr over 30 Minutes Intravenous On call to O.R. 05/14/18 1138 05/14/18 1954        Objective:   Vitals:   05/14/18 2004 05/15/18 0351 05/15/18 0857 05/15/18 1232  BP: (!) 143/99 (!) 147/55 (!) 118/58   Pulse: 76 64 76   Resp: 18 14 17    Temp: 98.5 F (36.9 C) 97.7 F (36.5 C) 98.3 F (36.8 C)   TempSrc: Oral Oral Oral   SpO2: 100% 97% 94%   Weight:    59 kg  Height:    5' 4.02" (1.626 m)    Wt Readings from Last 3 Encounters:  05/15/18 59 kg  05/09/16 59 kg  03/02/15 91.2 kg     Intake/Output Summary (Last 24 hours) at 05/15/2018 1516 Last data filed at 05/15/2018 1449 Gross per 24 hour  Intake 1240 ml  Output 200 ml  Net 1040 ml     Physical Exam Patient is examined daily including today on 05/15/18 , exams remain the same as of yesterday except that has changed   Gen:- Awake Alert,  In no apparent distress  HEENT:- [place].AT, No sclera icterus Neck-Supple Neck,No JVD,.  Lungs-  CTAB , fair symmetrical air movement CV- S1, S2 normal, regular  Abd-  +ve B.Sounds, Abd Soft, No tenderness,    Extremity/Skin:- No  edema, pedal pulses present  Psych-affect is appropriate, oriented x3 Neuro-no new focal deficits, no tremors MSK- Rt Hip pain, Rt LE shortened and rotated  Data Review:   Micro Results Recent Results (from the past 240 hour(s))  MRSA PCR Screening     Status: None   Collection Time: 05/14/18  8:25 PM  Result Value Ref Range Status   MRSA by PCR NEGATIVE NEGATIVE Final    Comment:        The GeneXpert MRSA Assay (FDA approved for NASAL specimens only), is one  component of a comprehensive MRSA colonization surveillance program. It is not intended to diagnose MRSA infection nor to guide or monitor treatment for MRSA infections. Performed at [facility], [address] [place], [place] [address]     Radiology Reports Dg Chest 1 View  Result Date: 05/14/2018 CLINICAL DATA:  Larey Seat today.  Right hip fracture. EXAM: CHEST  1 VIEW COMPARISON:  09/03/2017 FINDINGS: The cardiac silhouette, mediastinal and hilar contours are within normal limits and stable. The lungs are clear. No pleural effusion. The bony structures are intact. Suspect remote left humeral neck fracture. IMPRESSION: No acute cardiopulmonary findings and intact bony thorax. Electronically Signed   By: Rudie Meyer M.D.   On: 05/14/2018 10:31   Dg Hip Unilat With Pelvis 2-3 Views Right  Result Date: 05/14/2018 CLINICAL DATA:  Larey Seat today.  Right hip pain. EXAM: DG HIP (WITH OR WITHOUT PELVIS) 2-3V RIGHT COMPARISON:  Radiographs 11/11/2014 FINDINGS: There is a minimally displaced intertrochanteric fracture of the right hip. No femoral neck fracture. The bony pelvis is intact. No pelvic fractures. IMPRESSION: Mildly displaced intertrochanteric fracture of the right hip. Electronically Signed   By: Rudie Meyer M.D.   On: 05/14/2018 10:32   Dg Femur Min 2 Views Right  Result Date: 05/14/2018 CLINICAL DATA:  Larey Seat today.  Right leg pain. EXAM: RIGHT FEMUR 2 VIEWS COMPARISON:  None. FINDINGS: Intertrochanteric right hip fracture is noted. No femoral shaft fracture. IMPRESSION: Intertrochanteric fracture of the right hip. No femoral shaft fracture. Electronically Signed   By: Rudie Meyer M.D.   On: 05/14/2018 10:33     CBC Recent Labs  Lab 05/14/18 1013  WBC 6.4  HGB 12.4  HCT 39.4  PLT 180  MCV 100.3*  MCH 31.6  MCHC 31.5  RDW 14.0  LYMPHSABS 1.2  MONOABS 0.4  EOSABS 0.0  BASOSABS 0.0    Chemistries  Recent Labs  Lab 05/14/18 1041  NA 138  K 3.5  CL 101  CO2 27    GLUCOSE 146*  BUN 13  CREATININE 0.70  CALCIUM 9.1    Lab Results  Component Value Date   HGBA1C 6.0 (H) 05/15/2018   ------------------------------------------------------------------------------------------------------------------ Recent Labs    05/15/18 0145  TSH 2.061   ------------------------------------------------------------------------------------------------------------------ No results for input(s): VITAMINB12, FOLATE, FERRITIN, TIBC, IRON, RETICCTPCT in the last 72 hours.  Coagulation profile No results for input(s): INR, PROTIME in the last 168 hours.  No results for input(s): DDIMER in the last 72 hours.  Cardiac Enzymes No results for input(s): CKMB, TROPONINI, MYOGLOBIN in the last 168 hours.  Invalid input(s): CK ------------------------------------------------------------------------------------------------------------------ No results found for: BNP   Shon Hale M.D on 05/15/2018 at 3:16 PM  Pager---[phone] Go to [email or web address] - password [email or web address] for contact info  [facility] - Office  [phone]

## 2018-05-15 NOTE — Op Note (Signed)
OrthopaedicSurgeryOperativeNote (RUE:[id number]:[id number]) Date of Surgery: 05/15/2018  Admit Date: 05/14/2018   Diagnoses: Pre-Op Diagnoses: Right intertrochanteric femur fracture   Post-Op Diagnosis: Same  Procedures: CPT 27245-Cephalomedullary nailing of right intertrochanteric femur  Surgeons: Primary: Roby LoftsHaddix, Kevin P, MD   Assistant: Ulyses SouthwardSarah Yacobi, PA-C  Location:[facility] OR ROOM 07   Anesthesia: General  Antibiotics:Ancef 2g preop   Tourniquettime:None  EstimatedBloodLoss:50 mL   Complications:None  Specimens:None  Implants: Implant Name Type Inv. Item Serial No. Manufacturer Lot No. LRB No. Used Action  NAIL TROCH FIX [id number] 130 - [id number] Nail NAIL TROCH FIX [id number] 130  [employer] [id number] Right 1 Implanted  HELICAL BLADE    [employer]  Right 1 Implanted  SCREW LOCK STAR 5X38 - [id number] Screw SCREW LOCK STAR 5X38  [employer]  Right 1 Implanted    IndicationsforSurgery: 72 year old female who had a ground-level fall.  She has a history of diabetes hypertension and hypothyroidism.  She was brought to the emergency room where x-rays showed a right intertrochanteric femur fracture.  Due to her previous ambulatory capabilities with a walker I feel that proceeding with cephalo-medullary nailing is most appropriate.  Risks and benefits were discussed with her and her husband. Risks discussed included bleeding requiring blood transfusion, bleeding causing a hematoma, infection, malunion, nonunion, damage to surrounding nerves and blood vessels, pain, hardware prominence or irritation, hardware failure, stiffness, post-traumatic arthritis, DVT/PE, compartment syndrome, and even death.  She agrees to proceed with surgery and consent was obtained.  Operative Findings: Cephalo-medullary nailing of right intertrochanteric femur fracture using [employer] short TFN 10 mm in width with a 90 mm helical blade.  Procedure: The patient was identified in the preoperative  holding area. Consent was confirmed with the patient and their family and all questions were answered. The operative extremity was marked after confirmation with the patient and they were then brought back to the operating room by our anesthesia colleagues. The patient was placed under general anesthesia and then carefully transferred over to a fracture table. The feet were secured into a traction boot and well padded. A post was placed in the groin and traction was pulled on the operative leg. The contralateral leg was positioned out of the way of fluoroscopy and secure . Fluoroscopic images were obtained and traction and manipulation was performed to reduce the fracture. Once adequate reduction was performed then the operative extremity was prepped and draped in sterile fashion. Preincision timeout was performed to verify the patient, the procedure and the extremity. Preoperative antibiotics were dosed.  A small incision was made proximal to the greater trochanter. A curved Mayo scissors was used to spread down to the greater trochanter in line with the abductor musculature. A threaded guidepin was positioned at an appropriate starting point on the AP and lateral views. It was advanced in the femur past the lesser trochanter. A entry reamer with soft tissue protector was then used to enter the canal. A radiographic ruler was used to judge the size of the canal of the femur and a 10 mm short nail was placed into the canal and seated down to an appropriate position radiographically. The targeting arm for the helical blade was attached. A percutaneous incision was made for the guide for the helical blade. A threaded guidepin was placed into the femoral neck and head and fluoroscopy was used to confirm adequate placement with an acceptable tip-apex distance. A drill was used to perforate the lateral cortex and 90 mm helical blade was inserted into the head/neck segment.  The set screw was then tightened to set  rotation and backed off to allow compression. The construct was compressed and obtained some further reduction of the fracture. The aiming arm was removed from the nail and position of the helical blade was confirmed with fluoroscopy. Using the targeting arm a distal interlock was placed bicortically in the shaft.  Final fluoroscopic images were obtained and the incisions were copiously irrigated. The skin was closed with 2-0 vicryl, 3-0 monocryl and sealed with dermabond. The incisions were dressing with Mepilex dressings. The patient was carefully transferred to the regular floor bed and was taken to PACU in stable condition.  Post Op Plan/Instructions: The patient will be weightbearing as tolerated to the right lower extremity.  She will receive postoperative Ancef for surgical prophylaxis.  She will be started on Lovenox postoperative day 1 for DVT prophylaxis.  She will mobilize with physical and Occupational Therapy.  I was present and performed the entire surgery.  Ulyses Southward, PA-C did assist me throughout the case. An assistant was necessary given the difficulty in approach, maintenance of reduction and ability to instrument the fracture.  Truitt Merle, MD [facility]

## 2018-05-15 NOTE — Anesthesia Procedure Notes (Signed)
Procedure Name: Intubation Date/Time: 05/15/2018 1:42 PM Performed by: Verdie Drown, CRNA Pre-anesthesia Checklist: Patient identified, Emergency Drugs available, Suction available and Patient being monitored Patient Re-evaluated:Patient Re-evaluated prior to induction Oxygen Delivery Method: Circle System Utilized Preoxygenation: Pre-oxygenation with 100% oxygen Induction Type: IV induction Ventilation: Mask ventilation without difficulty Laryngoscope Size: Mac and 3 Grade View: Grade II Tube type: Oral Number of attempts: 1 Airway Equipment and Method: Stylet and Oral airway Placement Confirmation: ETT inserted through vocal cords under direct vision,  positive ETCO2 and breath sounds checked- equal and bilateral Tube secured with: Tape Dental Injury: Teeth and Oropharynx as per pre-operative assessment

## 2018-05-15 NOTE — Progress Notes (Signed)
Pt returned to room 5N26 after surgery. Received report from Loistine ChancePhilip, RN in PACU. See reassessment. Will continue to monitor.

## 2018-05-15 NOTE — Progress Notes (Signed)
Pre-op report called and given to Soledad GerlachLeigh Ann, RN in Short Stay. All questions answered to satisfaction. OR personnel transported pt off the unit.

## 2018-05-15 NOTE — Anesthesia Preprocedure Evaluation (Signed)
Anesthesia Evaluation  Patient identified by MRN, date of birth, ID band Patient awake    Reviewed: Allergy & Precautions, NPO status , Patient's Chart, lab work & pertinent test results  History of Anesthesia Complications (+) PONV and history of anesthetic complications  Airway Mallampati: II  TM Distance: >3 FB Neck ROM: Full    Dental  (+) Dental Advisory Given   Pulmonary neg pulmonary ROS,    breath sounds clear to auscultation       Cardiovascular hypertension, Pt. on medications and Pt. on home beta blockers (-) angina Rhythm:Regular Rate:Normal  '16 ECHO: EF 65-70%, mild AI   Neuro/Psych Anxiety negative neurological ROS     GI/Hepatic Neg liver ROS, GERD  Medicated and Controlled,  Endo/Other  diabetes, Oral Hypoglycemic AgentsHypothyroidism   Renal/GU negative Renal ROS     Musculoskeletal  (+) Arthritis ,   Abdominal   Peds  Hematology negative hematology ROS (+)   Anesthesia Other Findings   Reproductive/Obstetrics                             Anesthesia Physical  Anesthesia Plan  ASA: III  Anesthesia Plan: General   Post-op Pain Management:    Induction: Intravenous  PONV Risk Score and Plan: 4 or greater and Ondansetron, Dexamethasone, Midazolam, Scopolamine patch - Pre-op and Treatment may vary due to age or medical condition  Airway Management Planned: Oral ETT  Additional Equipment:   Intra-op Plan:   Post-operative Plan: Extubation in OR  Informed Consent: I have reviewed the patients History and Physical, chart, labs and discussed the procedure including the risks, benefits and alternatives for the proposed anesthesia with the patient or authorized representative who has indicated his/her understanding and acceptance.   Dental advisory given  Plan Discussed with: CRNA and Surgeon  Anesthesia Plan Comments:         Anesthesia Quick  Evaluation

## 2018-05-15 NOTE — Plan of Care (Signed)
Problem: Education: Goal: Knowledge of General Education information will improve Description Including pain rating scale, medication(s)/side effects and non-pharmacologic comfort measures Outcome: Progressing   Problem: Health Behavior/Discharge Planning: Goal: Ability to manage health-related needs will improve Outcome: Progressing   Problem: Clinical Measurements: Goal: Respiratory complications will improve Outcome: Progressing   Problem: Coping: Goal: Level of anxiety will decrease Outcome: Progressing   Problem: Elimination: Goal: Will not experience complications related to urinary retention Outcome: Progressing   Problem: Pain Managment: Goal: General experience of comfort will improve Outcome: Progressing   Problem: Safety: Goal: Ability to remain free from injury will improve Outcome: Progressing   Problem: Skin Integrity: Goal: Risk for impaired skin integrity will decrease Outcome: Progressing   

## 2018-05-16 DIAGNOSIS — S72141A Displaced intertrochanteric fracture of right femur, initial encounter for closed fracture: Principal | ICD-10-CM

## 2018-05-16 LAB — CBC
HCT: 35.4 % — ABNORMAL LOW (ref 36.0–46.0)
HEMOGLOBIN: 11.3 g/dL — AB (ref 12.0–15.0)
MCH: 31.5 pg (ref 26.0–34.0)
MCHC: 31.9 g/dL (ref 30.0–36.0)
MCV: 98.6 fL (ref 80.0–100.0)
Platelets: 184 10*3/uL (ref 150–400)
RBC: 3.59 MIL/uL — ABNORMAL LOW (ref 3.87–5.11)
RDW: 14.1 % (ref 11.5–15.5)
WBC: 7.5 10*3/uL (ref 4.0–10.5)
nRBC: 0 % (ref 0.0–0.2)

## 2018-05-16 LAB — GLUCOSE, CAPILLARY
GLUCOSE-CAPILLARY: 151 mg/dL — AB (ref 70–99)
GLUCOSE-CAPILLARY: 236 mg/dL — AB (ref 70–99)
Glucose-Capillary: 254 mg/dL — ABNORMAL HIGH (ref 70–99)
Glucose-Capillary: 202 mg/dL — ABNORMAL HIGH (ref 70–99)

## 2018-05-16 NOTE — Progress Notes (Signed)
Orthopaedic Trauma Progress Note  S: Patient resting in bed, overall doing well this morning. Husband at bedside. She is having some pain in her right mid thigh, but feels the pain is being well controlled on her current medications. She has no other complaints.   O:  Vitals:   05/16/18 0349 05/16/18 0813  BP: (!) 149/69 (!) 136/44  Pulse: 86 82  Resp: 17   Temp: 98.5 F (36.9 C) 99.4 F (37.4 C)  SpO2: 96% 95%   General: Patient resting comfortably in bed, AAOx3. Cooperative and pleasant RLE: Dressings clean, dry, intact. Mild tenderness to palpation of mid thigh. No tenderness to palpation of hip. Skin warm. Foot warm and well perfused. Motor and sensory function intact. Dorsalis pedi pulse 2+.  Imaging: Stable postop imaging. Cephalomedullary nail in excellent position  Labs:  Results for orders placed or performed during the hospital encounter of 05/14/18 (from the past 24 hour(s))  Glucose, capillary     Status: Abnormal   Collection Time: 05/15/18 11:21 AM  Result Value Ref Range   Glucose-Capillary 101 (H) 70 - 99 mg/dL  Glucose, capillary     Status: Abnormal   Collection Time: 05/15/18 12:05 PM  Result Value Ref Range   Glucose-Capillary 102 (H) 70 - 99 mg/dL  Glucose, capillary     Status: Abnormal   Collection Time: 05/15/18  3:47 PM  Result Value Ref Range   Glucose-Capillary 154 (H) 70 - 99 mg/dL   Comment 1 Notify RN   Glucose, capillary     Status: Abnormal   Collection Time: 05/15/18  6:05 PM  Result Value Ref Range   Glucose-Capillary 169 (H) 70 - 99 mg/dL  Glucose, capillary     Status: Abnormal   Collection Time: 05/15/18  8:47 PM  Result Value Ref Range   Glucose-Capillary 168 (H) 70 - 99 mg/dL  CBC     Status: Abnormal   Collection Time: 05/16/18  2:27 AM  Result Value Ref Range   WBC 7.5 4.0 - 10.5 K/uL   RBC 3.59 (L) 3.87 - 5.11 MIL/uL   Hemoglobin 11.3 (L) 12.0 - 15.0 g/dL   HCT 46.935.4 (L) 62.936.0 - 52.846.0 %   MCV 98.6 80.0 - 100.0 fL   MCH 31.5  26.0 - 34.0 pg   MCHC 31.9 30.0 - 36.0 g/dL   RDW 41.314.1 24.411.5 - 01.015.5 %   Platelets 184 150 - 400 K/uL   nRBC 0.0 0.0 - 0.2 %  Glucose, capillary     Status: Abnormal   Collection Time: 05/16/18  7:08 AM  Result Value Ref Range   Glucose-Capillary 151 (H) 70 - 99 mg/dL    Assessment: 72 year old female s/p fall   Injuries: 1. Right intertrochanteric femur fracture.    Weightbearing: WBAT RLE  Insicional and dressing care: Dressing clean, dry, intact. No drainage. Dressing will be changed tomrorow  Orthopedic device(s): None  CV/Blood loss: Hbg 11.3 this AM. Hemodynamically stable.  Pain management: 1. Hydrocodone 5/325 q 6 hours PRN 2. Robaxin 500 mg q 6 hours  VTE prophylaxis: restart Lovenox 40 mg daily  ID: Ancef 2gm postoperatively x24 hours  Foley/Lines: Foley removed. KVO IVFs  Medical co-morbidities: 1. Diabetes 2. HTN 3. Hypothyroidism 4. Dementia   Dispo: PT/OT eval, dispo likely SNF rehab  Follow - up plan:  F/U in 2 weeks for repeat XR and suture removal   Markhi Kleckner A. Ladonna SnideYacobi, PA-C [facility]

## 2018-05-16 NOTE — Plan of Care (Signed)
  Problem: Activity: Goal: Risk for activity intolerance will decrease Outcome: Progressing Note:  Pt was able to transfer to/from bedside commode with 2 assist   Problem: Pain Managment: Goal: General experience of comfort will improve Outcome: Progressing   Problem: Safety: Goal: Ability to remain free from injury will improve Outcome: Progressing   Problem: Skin Integrity: Goal: Risk for impaired skin integrity will decrease Outcome: Progressing

## 2018-05-16 NOTE — Evaluation (Signed)
Occupational Therapy Evaluation Patient Details Name: Chelsea Roberson MRN: [id number] DOB: [birth year] Today's Date: 05/16/2018    History of Present Illness 72 y.o. female with medical history significant of hypothyroidism; HTN; HLD; and DM presenting with R hip fracture from a fall. s/p R intertrocanteric IM nail 05/15/18.    Clinical Impression   Pt reports that she was independent in ADL and mobility with RW PTA (different from what she told PT this morning). She is currently heavy mod to max for sit <>Stand transfers, requires total A for LB dressing, max A for other LB ADL and set up in seated position for grooming and eating (no problem with hand to mouth) Pt very painful this pm with movement, and displaying anxiety prior to transfer. Verbal assurance provided and importance of movement explained to Pt and husband. Pt will require skilled OT in the acute setting as well as afterwards at the SNF level. Next session to focus on toilet transfer, and bed mobility    Follow Up Recommendations  SNF;Supervision/Assistance - 24 hour    Equipment Recommendations  Other (comment)(defer to next venue of care)    Recommendations for Other Services       Precautions / Restrictions Precautions Precautions: Fall Restrictions Weight Bearing Restrictions: Yes RLE Weight Bearing: Weight bearing as tolerated      Mobility Bed Mobility               General bed mobility comments: in recliner at beginning and end of session  Transfers Overall transfer level: Needs assistance Equipment used: Standard walker Transfers: Sit to/from Stand Sit to Stand: Mod assist         General transfer comment: modA for power up to RW, pt able to steady in standing with decreased weight bearing through R LE    Balance Overall balance assessment: Needs assistance Sitting-balance support: Feet supported;No upper extremity supported Sitting balance-Leahy Scale: Fair     Standing balance support:  During functional activity;Single extremity supported Standing balance-Leahy Scale: Poor Standing balance comment: dependent on BUE for support in PM session                           ADL either performed or assessed with clinical judgement   ADL Overall ADL's : Needs assistance/impaired Eating/Feeding: Set up;Sitting Eating/Feeding Details (indicate cue type and reason): able to bring hand to mouth, requests bananas Grooming: Set up;Sitting Grooming Details (indicate cue type and reason): in recliner Upper Body Bathing: Min guard;Sitting   Lower Body Bathing: Total assistance   Upper Body Dressing : Sitting;Moderate assistance   Lower Body Dressing: Total assistance   Toilet Transfer: Maximal assistance;Stand-pivot                   Vision         Perception     Praxis      Pertinent Vitals/Pain Pain Assessment: 0-10 Pain Score: 5  Pain Location: R hip and thigh  Pain Descriptors / Indicators: Grimacing;Guarding;Sore;Sharp;Moaning     Hand Dominance     Extremity/Trunk Assessment Upper Extremity Assessment Upper Extremity Assessment: Generalized weakness   Lower Extremity Assessment Lower Extremity Assessment: Defer to PT evaluation RLE Deficits / Details: R hip and knee AROM limited by pain, strength grossly assessed in mobility to be 3/5 RLE: Unable to fully assess due to pain RLE Sensation: WNL   Cervical / Trunk Assessment Cervical / Trunk Assessment: Kyphotic   Communication Communication Communication: Other (  comment)(pt with stuttering/repetition )   Cognition Arousal/Alertness: Awake/alert Behavior During Therapy: WFL for tasks assessed/performed Overall Cognitive Status: History of cognitive impairments - at baseline                                 General Comments: frequent repetition of information and answering questions incorrectly   General Comments       Exercises     Shoulder Instructions      Home  Living Family/patient expects to be discharged to:: Private residence Living Arrangements: Spouse/significant other Available Help at Discharge: Family;Available 24 hours/day Type of Home: House Home Access: Stairs to enter [employer] of Steps: 2(husband getting ramp installed) Entrance Stairs-Rails: None Home Layout: Multi-level;Able to live on main level with bedroom/bathroom     Bathroom Shower/Tub: Walk-in shower;Other (comment)(walk in bath tub )   Bathroom Toilet: Handicapped height     Home Equipment: Walker - 2 wheels;Shower seat - built in;Grab bars - tub/shower;Grab bars - toilet;Cane - single point          Prior Functioning/Environment Level of Independence: Needs assistance  Gait / Transfers Assistance Needed: utilizes RW for ambulation ADL's / Homemaking Assistance Needed: states that prior to fall she was independent in ADL - husband assists with IADL   Comments: mixed report from Pt from PT this am to OT this pm        OT Problem List: Decreased range of motion;Decreased activity tolerance;Impaired balance (sitting and/or standing);Decreased knowledge of use of DME or AE;Decreased safety awareness;Pain      OT Treatment/Interventions: Self-care/ADL training;Therapeutic exercise;DME and/or AE instruction;Therapeutic activities;Patient/family education;Balance training    OT Goals(Current goals can be found in the care plan section) Acute Rehab OT Goals Patient Stated Goal: go home with husband OT Goal Formulation: With patient/family Time For Goal Achievement: 05/30/18 Potential to Achieve Goals: Good  OT Frequency: Min 2X/week   Barriers to D/C:            Co-evaluation              AM-PAC OT "6 Clicks" Daily Activity     Outcome Measure Help from another person eating meals?: A Little Help from another person taking care of personal grooming?: A Little Help from another person toileting, which includes using toliet, bedpan, or  urinal?: A Lot Help from another person bathing (including washing, rinsing, drying)?: A Lot Help from another person to put on and taking off regular upper body clothing?: A Lot Help from another person to put on and taking off regular lower body clothing?: Total 6 Click Score: 13   End of Session Equipment Utilized During Treatment: Gait belt;Rolling walker Nurse Communication: Mobility status;Patient requests pain meds  Activity Tolerance: Patient tolerated treatment well;Patient limited by pain Patient left: in chair;with call bell/phone within reach;with family/visitor present;with chair alarm set  OT Visit Diagnosis: Unsteadiness on feet (R26.81);Other abnormalities of gait and mobility (R26.89);History of falling (Z91.81);Pain Pain - Right/Left: Right Pain - part of body: Hip                Time: 5409-8119 OT Time Calculation (min): 32 min Charges:  OT General Charges $OT Visit: 1 Visit OT Evaluation $OT Eval Moderate Complexity: 1 Mod OT Treatments $Self Care/Home Management : 8-22 mins  Sherryl Manges OTR/L Acute Rehabilitation Services Pager: [phone] Office: [phone]  Evern Bio Lavora Brisbon 05/16/2018, 5:27 PM

## 2018-05-16 NOTE — Evaluation (Signed)
Physical Therapy Evaluation Patient Details Name: Chelsea Roberson MRN: [id number] DOB: 1945-10-16 Today's Date: 05/16/2018   History of Present Illness  72 y.o. female with medical history significant of hypothyroidism; HTN; HLD; and DM presenting with R hip fracture from a fall. s/p R intertrocanteric IM nail 05/15/18.   Clinical Impression  PTA pt lived with husband in multistory home with master bed and bath on 1st floor with 2 steps to enter. Pt reports husband assists with some ADLs. Pt currently limited in safe mobility by R hip pain and decreased strength and endurance as well as underlying memory problems. Pt requires min A for bed mobility and modA for sit<>stand and pivot transfer to recliner. PT recommends SNF level rehab at discharge to regain PLOF before d/c home. PT will follow acutely.     Follow Up Recommendations SNF    Equipment Recommendations  Other (comment)(TBD at next venue)       Precautions / Restrictions Precautions Precautions: Fall Restrictions Weight Bearing Restrictions: Yes RLE Weight Bearing: Weight bearing as tolerated      Mobility  Bed Mobility Overal bed mobility: Needs Assistance Bed Mobility: Supine to Sit     Supine to sit: Min assist     General bed mobility comments: min A for utilization of pad to scoot hips to EoB, increased vc for use of bedrail to pull trunk tou upright   Transfers Overall transfer level: Needs assistance Equipment used: Standard walker Transfers: Sit to/from BJ'sStand;Stand Pivot Transfers Sit to Stand: Mod assist Stand pivot transfers: Mod assist       General transfer comment: modA for power up to RW, pt able to steady in standing with decreased weight bearing through R LE, unable to [employer]utilze RW to offweight R LE for pivoting to chair. pt able to statically stand without UE support for removal of RW in front of her, PT then provided modA for pivoting to chair, vc for coming up on ball of L foot to pivot    Ambulation/Gait             General Gait Details: unable to progress to ambulation         Balance Overall balance assessment: Needs assistance Sitting-balance support: Feet supported;No upper extremity supported Sitting balance-Leahy Scale: Fair     Standing balance support: During functional activity;Single extremity supported Standing balance-Leahy Scale: Poor                               Pertinent Vitals/Pain Pain Assessment: 0-10 Pain Score: 8  Pain Location: R hip and thigh  Pain Descriptors / Indicators: Grimacing;Guarding;Sore;Throbbing Pain Intervention(s): Limited activity within patient's tolerance;Monitored during session;Repositioned    Home Living Family/patient expects to be discharged to:: Private residence Living Arrangements: Spouse/significant other Available Help at Discharge: Family;Available 24 hours/day Type of Home: House Home Access: Stairs to enter Entrance Stairs-Rails: None Entrance Stairs-Number of Steps: 2(husband getting ramp installed) Home Layout: Multi-level;Able to live on main level with bedroom/bathroom Home Equipment: Dan HumphreysWalker - 2 wheels;Shower seat - built in;Grab bars - tub/shower;Grab bars - toilet;Cane - single point      Prior Function Level of Independence: Needs assistance   Gait / Transfers Assistance Needed: utilizes RW for ambulation  ADL's / Homemaking Assistance Needed: reports husband helps with some ADLs           Extremity/Trunk Assessment   Upper Extremity Assessment Upper Extremity Assessment: Overall WFL for tasks assessed  Lower Extremity Assessment Lower Extremity Assessment: RLE deficits/detail RLE Deficits / Details: R hip and knee AROM limited by pain, strength grossly assessed in mobility to be 3/5 RLE: Unable to fully assess due to pain RLE Sensation: WNL    Cervical / Trunk Assessment Cervical / Trunk Assessment: Kyphotic  Communication   Communication: Other  (comment)(pt with stuttering/repetition )  Cognition Arousal/Alertness: Awake/alert Behavior During Therapy: WFL for tasks assessed/performed Overall Cognitive Status: History of cognitive impairments - at baseline                                 General Comments: Pt husband not present to determine baseline, pt self reports memory loss and repeats her self often, in the middle of evaluation pt request to call her husband to tell him that "someone told me that my memory may come back", when PT inquired who told her that she stated "you know the person, the one who was in here." pt unable to reach huband on her cell phone and returned to working with therapist.       General Comments General comments (skin integrity, edema, etc.): Pt with incontinence in standing. Pt became nauseous with standing and was warm to touch. NT notified for vitals check and for replacement of Purewick due to incontinence.     Assessment/Plan    PT Assessment Patient needs continued PT services  PT Problem List Decreased strength;Decreased range of motion;Decreased activity tolerance;Decreased balance;Decreased mobility;Decreased cognition;Pain;Decreased safety awareness       PT Treatment Interventions DME instruction;Gait training;Functional mobility training;Therapeutic activities;Therapeutic exercise;Balance training;Cognitive remediation;Patient/family education    PT Goals (Current goals can be found in the Care Plan section)  Acute Rehab PT Goals Patient Stated Goal: go home with husband PT Goal Formulation: With patient Time For Goal Achievement: 05/30/18 Potential to Achieve Goals: Fair    Frequency Min 3X/week    AM-PAC PT "6 Clicks" Mobility  Outcome Measure Help needed turning from your back to your side while in a flat bed without using bedrails?: A Little Help needed moving from lying on your back to sitting on the side of a flat bed without using bedrails?: A Lot Help needed  moving to and from a bed to a chair (including a wheelchair)?: A Lot Help needed standing up from a chair using your arms (e.g., wheelchair or bedside chair)?: A Lot Help needed to walk in hospital room?: Total Help needed climbing 3-5 steps with a railing? : Total 6 Click Score: 11    End of Session Equipment Utilized During Treatment: Gait belt Activity Tolerance: Patient limited by pain Patient left: in chair;with call bell/phone within reach;with chair alarm set Nurse Communication: Mobility status;Precautions;Weight bearing status;Other (comment)(warm to touch check temperature) PT Visit Diagnosis: Unsteadiness on feet (R26.81);Other abnormalities of gait and mobility (R26.89);Muscle weakness (generalized) (M62.81);History of falling (Z91.81);Repeated falls (R29.6);Difficulty in walking, not elsewhere classified (R26.2);Pain Pain - Right/Left: Right Pain - part of body: Hip    Time: 6962-9528 PT Time Calculation (min) (ACUTE ONLY): 26 min   Charges:   PT Evaluation $PT Eval Moderate Complexity: 1 Mod PT Treatments $Therapeutic Activity: 8-22 mins        Dezi Brauner B. Beverely Risen PT, DPT Acute Rehabilitation Services Pager [phone] Office [phone]   Elon Alas Fleet 05/16/2018, 12:25 PM

## 2018-05-16 NOTE — Anesthesia Postprocedure Evaluation (Signed)
Anesthesia Post Note  Patient: Chelsea Roberson  Procedure(s) Performed: INTRAMEDULLARY (IM) NAIL FEMORAL (Right Hip)     Patient location during evaluation: PACU Anesthesia Type: General Level of consciousness: awake and alert Pain management: pain level controlled Vital Signs Assessment: post-procedure vital signs reviewed and stable Respiratory status: spontaneous breathing, nonlabored ventilation and respiratory function stable Cardiovascular status: blood pressure returned to baseline and stable Postop Assessment: no apparent nausea or vomiting Anesthetic complications: no    Last Vitals:  Vitals:   05/16/18 0013 05/16/18 0349  BP: (!) 139/54 (!) 149/69  Pulse: 83 86  Resp: 12 17  Temp: 36.6 C 36.9 C  SpO2: 93% 96%    Last Pain:  Vitals:   05/16/18 0619  TempSrc:   PainSc: Asleep   Pain Goal: Patients Stated Pain Goal: 0 (05/14/18 0911)               Lowella CurbWarren Ray Ulysess Witz

## 2018-05-16 NOTE — Progress Notes (Addendum)
Patient Demographics:    Chelsea Roberson, is a 72 y.o. female, DOB - [birth year], ZOX:[id number]  Admit date - 05/14/2018   Admitting Physician Jonah Blue, MD  Outpatient Primary MD for the patient is Merlene Laughter, MD  LOS - 2   Chief Complaint  Patient presents with  . Fall  . Hip Pain        Subjective:    Chelsea Roberson today has no fevers, no emesis,  No chest pain,  Eating ok...   Assessment  & Plan :    Principal Problem:   Closed comminuted intertrochanteric fracture of proximal end of right femur (HCC) Active Problems:   HTN (hypertension)   Hyperlipidemia   DM (diabetes mellitus), type 2 (HCC)   Hypothyroidism   Dementia (HCC)  Brief Summary 72 y.o. female with medical history significant of hypothyroidism; HTN; HLD; and DM admitted on 05/14/2018 with right hip pain/right hip fracture after mechanical fall   Plan:- 1)DM2-A1c 6.0, reflecting excellent diabetic control, prior to admission patient was on metformin/Actos,   Use Novolog/Humalog Sliding scale insulin with Accu-Cheks /Fingersticks as ordered  2)HTN-stable, c/n  Coreg 12.5 mg twice daily, losartan/HCTZ,   may use IV Hydralazine 10 mg  Every 4 hours Prn for systolic blood pressure over 170 mmhg  3)Hypothyroidism-patient appears euthyroid, TSH is 2.0, continue levothyroxine 50 mcg daily  4)Anxiety/Insomnia--- stable, continue buspirone, also continue trazodone for sleep  5)Rt Hip Fx--status post Mechanical Fall, s/p ORIF on 05/15/18,   Weightbearing: WBAT RLE                  Insicional and dressing care: Dressing clean, dry, intact. No drainage. Dressing changes as advised .   Disposition/Need for in-Hospital Stay- patient unable to be discharged at this time due to due to right hip fracture requiring   placement to SNF rehab post-op   Code Status : full   Disposition Plan  : SNF rehab   Consults  :  ortho  DVT  Prophylaxis  :  Lovenox  As per ortho SCDs  Lab Results  Component Value Date   PLT 184 05/16/2018   Inpatient Medications  Scheduled Meds: . brimonidine  1 drop Both Eyes BID   And  . timolol  1 drop Both Eyes BID  . busPIRone  7.5 mg Oral Daily   And  . busPIRone  15 mg Oral QHS  . carvedilol  3.125 mg Oral BID WC  . enoxaparin (LOVENOX) injection  40 mg Subcutaneous Q24H  . feeding supplement (ENSURE ENLIVE)  237 mL Oral BID BM  . losartan  50 mg Oral Daily   And  . hydrochlorothiazide  12.5 mg Oral Daily  . insulin aspart  0-5 Units Subcutaneous QHS  . insulin aspart  0-5 Units Subcutaneous QHS  . insulin aspart  0-9 Units Subcutaneous TID WC  . levothyroxine  50 mcg Oral QAC breakfast  . multivitamin with minerals  1 tablet Oral Daily  . pantoprazole  40 mg Oral Daily  . povidone-iodine  2 application Topical Once  . senna-docusate  2 tablet Oral BID  . simvastatin  20 mg Oral q1800  . traZODone  50 mg Oral QHS  . trimethoprim  100 mg Oral Daily  Continuous Infusions: . lactated ringers 10 mL/hr at 05/15/18 1240  . methocarbamol (ROBAXIN) IV     PRN Meds:.bisacodyl, hydrALAZINE, HYDROcodone-acetaminophen, methocarbamol **OR** methocarbamol (ROBAXIN) IV, morphine injection, polyethylene glycol  Anti-infectives (From admission, onward)   Start     Dose/Rate Route Frequency Ordered Stop   05/15/18 2145  ceFAZolin (ANCEF) IVPB 2g/100 mL premix     2 g 200 mL/hr over 30 Minutes Intravenous Every 8 hours 05/15/18 1742 05/16/18 1434   05/15/18 1445  vancomycin (VANCOCIN) powder  Status:  Discontinued       As needed 05/15/18 1445 05/15/18 1509   05/15/18 1045  ceFAZolin (ANCEF) IVPB 2g/100 mL premix     2 g 200 mL/hr over 30 Minutes Intravenous On call to O.R. 05/14/18 1923 05/15/18 1414   05/15/18 1000  trimethoprim (TRIMPEX) tablet 100 mg     100 mg Oral Daily 05/14/18 1406     05/14/18 1145  ceFAZolin (ANCEF) IVPB 2g/100 mL premix  Status:  Discontinued     2  g 200 mL/hr over 30 Minutes Intravenous On call to O.R. 05/14/18 1138 05/14/18 1954      Objective:   Vitals:   05/16/18 0013 05/16/18 0349 05/16/18 0813 05/16/18 1436  BP: (!) 139/54 (!) 149/69 (!) 136/44 (!) 124/53  Pulse: 83 86 82 82  Resp: 12 17    Temp: 97.9 F (36.6 C) 98.5 F (36.9 C) 99.4 F (37.4 C) 98.9 F (37.2 C)  TempSrc: Oral Oral Oral Oral  SpO2: 93% 96% 95% 97%  Weight:      Height:       Wt Readings from Last 3 Encounters:  05/15/18 59 kg  05/09/16 59 kg  03/02/15 91.2 kg    Intake/Output Summary (Last 24 hours) at 05/16/2018 1533 Last data filed at 05/16/2018 0815 Gross per 24 hour  Intake 656 ml  Output 900 ml  Net -244 ml    Physical Exam Patient is examined daily including today on 05/16/18 , exams remain the same as of yesterday except that has changed   Gen:- Awake Alert,  In no apparent distress  HEENT:- [place].AT, No sclera icterus Neck-Supple Neck,No JVD,.  Lungs-  CTAB , fair symmetrical air movement CV- S1, S2 normal, regular  Abd-  +ve B.Sounds, Abd Soft, No tenderness,    Extremity/Skin:- No  edema, pedal pulses present  Psych-affect is appropriate, oriented x3 Neuro-no new focal deficits, no tremors MSK- Rt s/p ORIF wound is Clean/Intact, appropriate postop tenderness   Data Review:   Micro Results Recent Results (from the past 240 hour(s))  MRSA PCR Screening     Status: None   Collection Time: 05/14/18  8:25 PM  Result Value Ref Range Status   MRSA by PCR NEGATIVE NEGATIVE Final    Comment:        The GeneXpert MRSA Assay (FDA approved for NASAL specimens only), is one component of a comprehensive MRSA colonization surveillance program. It is not intended to diagnose MRSA infection nor to guide or monitor treatment for MRSA infections. Performed at [facility], [address] [place], [place] [id number]     Radiology Reports Dg Chest 1 View  Result Date: 05/14/2018 CLINICAL DATA:  Larey SeatFell today.  Right hip  fracture. EXAM: CHEST  1 VIEW COMPARISON:  09/03/2017 FINDINGS: The cardiac silhouette, mediastinal and hilar contours are within normal limits and stable. The lungs are clear. No pleural effusion. The bony structures are intact. Suspect remote left humeral neck fracture. IMPRESSION:  No acute cardiopulmonary findings and intact bony thorax. Electronically Signed   By: Rudie Meyer M.D.   On: 05/14/2018 10:31   Dg Pelvis Portable  Result Date: 05/15/2018 CLINICAL DATA:  Postop EXAM: PORTABLE PELVIS 1-2 VIEWS COMPARISON:  05/14/2018 FINDINGS: Internal fixation across the right femoral intertrochanteric fracture. Anatomic alignment. No hardware bony complicating feature. IMPRESSION: Internal fixation. No visible complicating feature. Anatomic alignment. Electronically Signed   By: Charlett Nose M.D.   On: 05/15/2018 19:32   Dg C-arm 1-60 Min  Result Date: 05/15/2018 CLINICAL DATA:  Open reduction internal fixation of right hip fracture EXAM: DG C-ARM 61-120 MIN; OPERATIVE RIGHT HIP WITH PELVIS COMPARISON:  05/14/2018 FINDINGS: Intraoperative fluoroscopic images demonstrate interval intramedullary rod and screw fixation of right intertrochanteric femoral fracture. Normal alignment of the osseous structures and the orthopedic hardware. No new fractures are seen. IMPRESSION: Status post intramedullary rod fixation of right intertrochanteric femoral fracture without evidence of immediate complications. Electronically Signed   By: Ted Mcalpine M.D.   On: 05/15/2018 16:26   Dg Hip Operative Unilat W Or W/o Pelvis Right  Result Date: 05/15/2018 CLINICAL DATA:  Open reduction internal fixation of right hip fracture EXAM: DG C-ARM 61-120 MIN; OPERATIVE RIGHT HIP WITH PELVIS COMPARISON:  05/14/2018 FINDINGS: Intraoperative fluoroscopic images demonstrate interval intramedullary rod and screw fixation of right intertrochanteric femoral fracture. Normal alignment of the osseous structures and the orthopedic  hardware. No new fractures are seen. IMPRESSION: Status post intramedullary rod fixation of right intertrochanteric femoral fracture without evidence of immediate complications. Electronically Signed   By: Ted Mcalpine M.D.   On: 05/15/2018 16:26   Dg Hip Unilat With Pelvis 2-3 Views Right  Result Date: 05/14/2018 CLINICAL DATA:  Larey Seat today.  Right hip pain. EXAM: DG HIP (WITH OR WITHOUT PELVIS) 2-3V RIGHT COMPARISON:  Radiographs 11/11/2014 FINDINGS: There is a minimally displaced intertrochanteric fracture of the right hip. No femoral neck fracture. The bony pelvis is intact. No pelvic fractures. IMPRESSION: Mildly displaced intertrochanteric fracture of the right hip. Electronically Signed   By: Rudie Meyer M.D.   On: 05/14/2018 10:32   Dg Femur Min 2 Views Right  Result Date: 05/14/2018 CLINICAL DATA:  Larey Seat today.  Right leg pain. EXAM: RIGHT FEMUR 2 VIEWS COMPARISON:  None. FINDINGS: Intertrochanteric right hip fracture is noted. No femoral shaft fracture. IMPRESSION: Intertrochanteric fracture of the right hip. No femoral shaft fracture. Electronically Signed   By: Rudie Meyer M.D.   On: 05/14/2018 10:33    CBC Recent Labs  Lab 05/14/18 1013 05/16/18 0227  WBC 6.4 7.5  HGB 12.4 11.3*  HCT 39.4 35.4*  PLT 180 184  MCV 100.3* 98.6  MCH 31.6 31.5  MCHC 31.5 31.9  RDW 14.0 14.1  LYMPHSABS 1.2  --   MONOABS 0.4  --   EOSABS 0.0  --   BASOSABS 0.0  --     Chemistries  Recent Labs  Lab 05/14/18 1041  NA 138  K 3.5  CL 101  CO2 27  GLUCOSE 146*  BUN 13  CREATININE 0.70  CALCIUM 9.1    Lab Results  Component Value Date   HGBA1C 6.0 (H) 05/15/2018   ------------------------------------------------------------------------------------------------------------------ Recent Labs    05/15/18 0145  TSH 2.061    Shon Hale M.D on 05/16/2018 at 3:33 PM  Pager---[phone] Go to [email or web address] - password [email or web address] for contact info  [facility] - Office   [phone]

## 2018-05-17 ENCOUNTER — Encounter (HOSPITAL_COMMUNITY): Payer: Self-pay | Admitting: Student

## 2018-05-17 LAB — CBC
HCT: 32.5 % — ABNORMAL LOW (ref 36.0–46.0)
Hemoglobin: 10.6 g/dL — ABNORMAL LOW (ref 12.0–15.0)
MCH: 31.9 pg (ref 26.0–34.0)
MCHC: 32.6 g/dL (ref 30.0–36.0)
MCV: 97.9 fL (ref 80.0–100.0)
Platelets: 172 10*3/uL (ref 150–400)
RBC: 3.32 MIL/uL — AB (ref 3.87–5.11)
RDW: 14.4 % (ref 11.5–15.5)
WBC: 7.5 10*3/uL (ref 4.0–10.5)
nRBC: 0 % (ref 0.0–0.2)

## 2018-05-17 LAB — BASIC METABOLIC PANEL
Anion gap: 11 (ref 5–15)
BUN: 12 mg/dL (ref 8–23)
CO2: 28 mmol/L (ref 22–32)
Calcium: 8.6 mg/dL — ABNORMAL LOW (ref 8.9–10.3)
Chloride: 99 mmol/L (ref 98–111)
Creatinine, Ser: 0.51 mg/dL (ref 0.44–1.00)
GFR calc Af Amer: >60 mL/min (ref >60)
Glucose, Bld: 155 mg/dL — ABNORMAL HIGH (ref 70–99)
Potassium: 3 mmol/L — ABNORMAL LOW (ref 3.5–5.1)
SODIUM: 138 mmol/L (ref 135–145)

## 2018-05-17 LAB — GLUCOSE, CAPILLARY
GLUCOSE-CAPILLARY: 135 mg/dL — AB (ref 70–99)
Glucose-Capillary: 228 mg/dL — ABNORMAL HIGH (ref 70–99)
Glucose-Capillary: 222 mg/dL — ABNORMAL HIGH (ref 70–99)
Glucose-Capillary: 191 mg/dL — ABNORMAL HIGH (ref 70–99)

## 2018-05-17 MED ORDER — POTASSIUM CHLORIDE CRYS ER 20 MEQ PO TBCR
40.0000 meq | EXTENDED_RELEASE_TABLET | Freq: Once | ORAL | Status: AC
  Administered 2018-05-17: 40 meq via ORAL
  Filled 2018-05-17: qty 2

## 2018-05-17 MED ORDER — BISACODYL 10 MG RE SUPP
10.0000 mg | Freq: Once | RECTAL | Status: AC
  Administered 2018-05-17: 10 mg via RECTAL
  Filled 2018-05-17: qty 1

## 2018-05-17 NOTE — Progress Notes (Signed)
Orthopaedic Trauma Progress Note  S: Patient doing well this morning, resting comfortably in bed. Husband at bedside. Notes some improvement in the pain in her right mid thigh. Feels pain is overall being well controlled on her current medications. She is experiencing some left sided lower abdominal pain this morning. She has not had a bowel movement since surgery.  O:  Vitals:   05/17/18 0426 05/17/18 0918  BP: 130/81 (!) 148/67  Pulse: 81 81  Resp: 14   Temp: 98.7 F (37.1 C)   SpO2: 96% 98%   General: Patient resting comfortably in bed, AAOx3. Cooperative and pleasant RLE: Dressings clean, dry, intact. Dressing removed. Incision clean, dry, intact, without signs of infection. Minimal tenderness to palpation of mid thigh. No tenderness to palpation of hip. Skin warm and dry. Foot warm and well perfused. Motor and sensory function intact. Dorsalis pedi pulse 2+.  Imaging: Stable postop imaging. Cephalomedullary nail in excellent position  Labs:  Results for orders placed or performed during the hospital encounter of 05/14/18 (from the past 24 hour(s))  Glucose, capillary     Status: Abnormal   Collection Time: 05/16/18 11:45 AM  Result Value Ref Range   Glucose-Capillary 236 (H) 70 - 99 mg/dL  Glucose, capillary     Status: Abnormal   Collection Time: 05/16/18  4:09 PM  Result Value Ref Range   Glucose-Capillary 254 (H) 70 - 99 mg/dL  Glucose, capillary     Status: Abnormal   Collection Time: 05/16/18  8:53 PM  Result Value Ref Range   Glucose-Capillary 202 (H) 70 - 99 mg/dL   Comment 1 Notify RN   Glucose, capillary     Status: Abnormal   Collection Time: 05/17/18  6:41 AM  Result Value Ref Range   Glucose-Capillary 135 (H) 70 - 99 mg/dL  CBC     Status: Abnormal   Collection Time: 05/17/18  8:07 AM  Result Value Ref Range   WBC 7.5 4.0 - 10.5 K/uL   RBC 3.32 (L) 3.87 - 5.11 MIL/uL   Hemoglobin 10.6 (L) 12.0 - 15.0 g/dL   HCT 08.6 (L) 57.8 - 46.9 %   MCV 97.9 80.0 -  100.0 fL   MCH 31.9 26.0 - 34.0 pg   MCHC 32.6 30.0 - 36.0 g/dL   RDW 62.9 52.8 - 41.3 %   Platelets 172 150 - 400 K/uL   nRBC 0.0 0.0 - 0.2 %  Basic metabolic panel     Status: Abnormal   Collection Time: 05/17/18  8:07 AM  Result Value Ref Range   Sodium 138 135 - 145 mmol/L   Potassium 3.0 (L) 3.5 - 5.1 mmol/L   Chloride 99 98 - 111 mmol/L   CO2 28 22 - 32 mmol/L   Glucose, Bld 155 (H) 70 - 99 mg/dL   BUN 12 8 - 23 mg/dL   Creatinine, Ser 2.44 0.44 - 1.00 mg/dL   Calcium 8.6 (L) 8.9 - 10.3 mg/dL   GFR calc non Af Amer >60 >60 mL/min   GFR calc Af Amer >60 >60 mL/min   Anion gap 11 5 - 15    Assessment: 72 year old female s/p fall s/p cephalomedullary nailing of right intertrochanteric femur on 05/15/18  Injuries: 1. Right intertrochanteric femur fracture.    Weightbearing: WBAT RLE  Insicional and dressing care: Dressing clean, dry, intact. No drainage. Dressings removed. Leave incisions open to air  Orthopedic device(s): None  CV/Blood loss: Acute blood loss anemia. Hbg 10.6 this  AM, down from 11.3 yesterday. Hemodynamically stable.  Pain management: 1. Norco 5/325 1-2 tabs q 6 hours PRN 2. Robaxin 500 mg PO q 6 hours  VTE prophylaxis: Lovenox 40 mg daily  ID: Ancef 2gm postoperatively x24 hours - completed  Foley/Lines: Foley removed. KVO IVFs  Medical co-morbidities: 1. Diabetes 2. HTN - home meds 3. Hypothyroidism - home meds 4. Dementia   Dispo: PT/OT continue to evaluate and treat, dispo likely SNF rehab  Follow - up plan:  F/U in 2 weeks for repeat XR and suture removal   Tenesha Garza A. Ladonna SnideYacobi, PA-C [facility]

## 2018-05-17 NOTE — Progress Notes (Addendum)
Patient Demographics:    Chelsea Roberson, is a 72 y.o. female, DOB - 1945/08/14, ZOX:[id number]  Admit date - 05/14/2018   Admitting Physician Jonah BlueJennifer Yates, MD  Outpatient Primary MD for the patient is Merlene LaughterStoneking, Hal, MD  LOS - 3   Chief Complaint  Patient presents with  . Fall  . Hip Pain        Subjective:    Chelsea Roberson today has no fevers, no emesis,  No chest pain, ambulated with physical therapy  Assessment  & Plan :    Principal Problem:   Closed comminuted intertrochanteric fracture of proximal end of right femur (HCC) Active Problems:   HTN (hypertension)   Hyperlipidemia   DM (diabetes mellitus), type 2 (HCC)   Hypothyroidism   Dementia (HCC)  Brief Summary 72 y.o. female with medical history significant of hypothyroidism; HTN; HLD; and DM admitted on 05/14/2018 with right hip pain/right hip fracture after mechanical fall   05/17/18 update----no significant change in medical condition at this time awaiting transfer to SNF rehab  Plan:- 1)DM2-A1c 6.0, reflecting excellent diabetic control, prior to admission patient was on metformin/Actos,   Use Novolog/Humalog Sliding scale insulin with Accu-Cheks /Fingersticks as ordered  2)HTN-stable, c/n  Coreg 12.5 mg twice daily, losartan/HCTZ,   may use IV Hydralazine 10 mg  Every 4 hours Prn for systolic blood pressure over 170 mmhg  3)Hypothyroidism-patient appears euthyroid, TSH is 2.0, continue levothyroxine 50 mcg daily  4)Anxiety/Insomnia--- stable, continue buspirone, also continue trazodone for sleep  5)Rt Hip Fx--status post Mechanical Fall, s/p ORIF on 05/15/18,   Weightbearing: WBAT RLE                  Insicional and dressing care: Dressing clean, dry, intact. No drainage. Dressing changes as advised .   6)Hypokalemia--- most likely related to HCTZ use, replace potassium orally and recheck BMP  Disposition/Need for  in-Hospital Stay- patient unable to be discharged at this time due to due to right hip fracture requiring   placement to SNF rehab post-op   Code Status : full   Disposition Plan  : SNF rehab   Consults  :  ortho  DVT Prophylaxis  :  Lovenox  As per ortho SCDs  Lab Results  Component Value Date   PLT 172 05/17/2018   Inpatient Medications  Scheduled Meds: . brimonidine  1 drop Both Eyes BID   And  . timolol  1 drop Both Eyes BID  . busPIRone  7.5 mg Oral Daily   And  . busPIRone  15 mg Oral QHS  . carvedilol  3.125 mg Oral BID WC  . enoxaparin (LOVENOX) injection  40 mg Subcutaneous Q24H  . feeding supplement (ENSURE ENLIVE)  237 mL Oral BID BM  . losartan  50 mg Oral Daily   And  . hydrochlorothiazide  12.5 mg Oral Daily  . insulin aspart  0-5 Units Subcutaneous QHS  . insulin aspart  0-5 Units Subcutaneous QHS  . insulin aspart  0-9 Units Subcutaneous TID WC  . levothyroxine  50 mcg Oral QAC breakfast  . multivitamin with minerals  1 tablet Oral Daily  . pantoprazole  40 mg Oral Daily  . povidone-iodine  2 application Topical Once  . senna-docusate  2 tablet Oral BID  . simvastatin  20 mg Oral q1800  . traZODone  50 mg Oral QHS  . trimethoprim  100 mg Oral Daily   Continuous Infusions: . lactated ringers 10 mL/hr at 05/17/18 0623  . methocarbamol (ROBAXIN) IV     PRN Meds:.bisacodyl, hydrALAZINE, HYDROcodone-acetaminophen, methocarbamol **OR** methocarbamol (ROBAXIN) IV, morphine injection, polyethylene glycol  Anti-infectives (From admission, onward)   Start     Dose/Rate Route Frequency Ordered Stop   05/15/18 2145  ceFAZolin (ANCEF) IVPB 2g/100 mL premix     2 g 200 mL/hr over 30 Minutes Intravenous Every 8 hours 05/15/18 1742 05/16/18 1434   05/15/18 1445  vancomycin (VANCOCIN) powder  Status:  Discontinued       As needed 05/15/18 1445 05/15/18 1509   05/15/18 1045  ceFAZolin (ANCEF) IVPB 2g/100 mL premix     2 g 200 mL/hr over 30 Minutes Intravenous On  call to O.R. 05/14/18 1923 05/15/18 1414   05/15/18 1000  trimethoprim (TRIMPEX) tablet 100 mg     100 mg Oral Daily 05/14/18 1406     05/14/18 1145  ceFAZolin (ANCEF) IVPB 2g/100 mL premix  Status:  Discontinued     2 g 200 mL/hr over 30 Minutes Intravenous On call to O.R. 05/14/18 1138 05/14/18 1954      Objective:   Vitals:   05/17/18 0426 05/17/18 0918 05/17/18 1333 05/17/18 1657  BP: 130/81 (!) 148/67 (!) 99/59 (!) 108/51  Pulse: 81 81 69 76  Resp: 14     Temp: 98.7 F (37.1 C)  98.8 F (37.1 C)   TempSrc: Oral  Oral   SpO2: 96% 98% 96%   Weight:      Height:       Wt Readings from Last 3 Encounters:  05/15/18 59 kg  05/09/16 59 kg  03/02/15 91.2 kg    Intake/Output Summary (Last 24 hours) at 05/17/2018 1801 Last data filed at 05/17/2018 0500 Gross per 24 hour  Intake -  Output 1 ml  Net -1 ml    Physical Exam Patient is examined daily including today on 05/17/18 , exams remain the same as of yesterday except that has changed   Gen:- Awake Alert,  In no apparent distress  HEENT:- [place].AT, No sclera icterus Neck-Supple Neck,No JVD,.  Lungs-  CTAB , fair symmetrical air movement CV- S1, S2 normal, regular  Abd-  +ve B.Sounds, Abd Soft, No tenderness,    Extremity/Skin:- No  edema, pedal pulses present  Psych-affect is appropriate, oriented x3 Neuro-no new focal deficits, no tremors MSK- Rt s/p ORIF wound is Clean/Intact, appropriate postop tenderness   Data Review:   Micro Results Recent Results (from the past 240 hour(s))  MRSA PCR Screening     Status: None   Collection Time: 05/14/18  8:25 PM  Result Value Ref Range Status   MRSA by PCR NEGATIVE NEGATIVE Final    Comment:        The GeneXpert MRSA Assay (FDA approved for NASAL specimens only), is one component of a comprehensive MRSA colonization surveillance program. It is not intended to diagnose MRSA infection nor to guide or monitor treatment for MRSA infections. Performed at [facility], [address], [place], [place] [address]     Radiology Reports Dg Chest 1 View  Result Date: 05/14/2018 CLINICAL DATA:  Larey Seat today.  Right hip fracture. EXAM: CHEST  1 VIEW COMPARISON:  09/03/2017 FINDINGS: The cardiac silhouette, mediastinal and hilar contours are within normal  limits and stable. The lungs are clear. No pleural effusion. The bony structures are intact. Suspect remote left humeral neck fracture. IMPRESSION: No acute cardiopulmonary findings and intact bony thorax. Electronically Signed   By: Rudie Meyer M.D.   On: 05/14/2018 10:31   Dg Pelvis Portable  Result Date: 05/15/2018 CLINICAL DATA:  Postop EXAM: PORTABLE PELVIS 1-2 VIEWS COMPARISON:  05/14/2018 FINDINGS: Internal fixation across the right femoral intertrochanteric fracture. Anatomic alignment. No hardware bony complicating feature. IMPRESSION: Internal fixation. No visible complicating feature. Anatomic alignment. Electronically Signed   By: Charlett Nose M.D.   On: 05/15/2018 19:32   Dg C-arm 1-60 Min  Result Date: 05/15/2018 CLINICAL DATA:  Open reduction internal fixation of right hip fracture EXAM: DG C-ARM 61-120 MIN; OPERATIVE RIGHT HIP WITH PELVIS COMPARISON:  05/14/2018 FINDINGS: Intraoperative fluoroscopic images demonstrate interval intramedullary rod and screw fixation of right intertrochanteric femoral fracture. Normal alignment of the osseous structures and the orthopedic hardware. No new fractures are seen. IMPRESSION: Status post intramedullary rod fixation of right intertrochanteric femoral fracture without evidence of immediate complications. Electronically Signed   By: Ted Mcalpine M.D.   On: 05/15/2018 16:26   Dg Hip Operative Unilat W Or W/o Pelvis Right  Result Date: 05/15/2018 CLINICAL DATA:  Open reduction internal fixation of right hip fracture EXAM: DG C-ARM 61-120 MIN; OPERATIVE RIGHT HIP WITH PELVIS COMPARISON:  05/14/2018 FINDINGS: Intraoperative fluoroscopic images  demonstrate interval intramedullary rod and screw fixation of right intertrochanteric femoral fracture. Normal alignment of the osseous structures and the orthopedic hardware. No new fractures are seen. IMPRESSION: Status post intramedullary rod fixation of right intertrochanteric femoral fracture without evidence of immediate complications. Electronically Signed   By: Ted Mcalpine M.D.   On: 05/15/2018 16:26   Dg Hip Unilat With Pelvis 2-3 Views Right  Result Date: 05/14/2018 CLINICAL DATA:  Larey Seat today.  Right hip pain. EXAM: DG HIP (WITH OR WITHOUT PELVIS) 2-3V RIGHT COMPARISON:  Radiographs 11/11/2014 FINDINGS: There is a minimally displaced intertrochanteric fracture of the right hip. No femoral neck fracture. The bony pelvis is intact. No pelvic fractures. IMPRESSION: Mildly displaced intertrochanteric fracture of the right hip. Electronically Signed   By: Rudie Meyer M.D.   On: 05/14/2018 10:32   Dg Femur Min 2 Views Right  Result Date: 05/14/2018 CLINICAL DATA:  Larey Seat today.  Right leg pain. EXAM: RIGHT FEMUR 2 VIEWS COMPARISON:  None. FINDINGS: Intertrochanteric right hip fracture is noted. No femoral shaft fracture. IMPRESSION: Intertrochanteric fracture of the right hip. No femoral shaft fracture. Electronically Signed   By: Rudie Meyer M.D.   On: 05/14/2018 10:33    CBC Recent Labs  Lab 05/14/18 1013 05/16/18 0227 05/17/18 0807  WBC 6.4 7.5 7.5  HGB 12.4 11.3* 10.6*  HCT 39.4 35.4* 32.5*  PLT 180 184 172  MCV 100.3* 98.6 97.9  MCH 31.6 31.5 31.9  MCHC 31.5 31.9 32.6  RDW 14.0 14.1 14.4  LYMPHSABS 1.2  --   --   MONOABS 0.4  --   --   EOSABS 0.0  --   --   BASOSABS 0.0  --   --     Chemistries  Recent Labs  Lab 05/14/18 1041 05/17/18 0807  NA 138 138  K 3.5 3.0*  CL 101 99  CO2 27 28  GLUCOSE 146* 155*  BUN 13 12  CREATININE 0.70 0.51  CALCIUM 9.1 8.6*    Lab Results  Component Value Date   HGBA1C 6.0 (H) 05/15/2018    ------------------------------------------------------------------------------------------------------------------  Recent Labs    05/15/18 0145  TSH 2.061    Shon Hale M.D on 05/17/2018 at 6:01 PM  Pager---[phone] Go to [email or web address] - password [email or web address] for contact info  [facility] - Office  [phone]

## 2018-05-17 NOTE — NC FL2 (Signed)
[place] MEDICAID FL2 LEVEL OF CARE SCREENING TOOL     IDENTIFICATION  Patient Name: Chelsea Roberson Birthdate: [birth year] Sex: female Admission Date (Current Location): 05/14/2018  [facility] and [place] Number:  [employer] and Address:  [facility], [address], [place], [place] [address]      Provider Number: [id number]  Attending Physician Name and Address:  Shon Hale, MD  Relative Name and Phone Number:  Molly Maduro (spouse) [phone]    Current Level of Care: Hospital Recommended Level of Care: Skilled Nursing Facility Prior Approval Number:    Date Approved/Denied: 05/17/18 PASRR Number: [id number]  Discharge Plan: SNF    Current Diagnoses: Patient Active Problem List   Diagnosis Date Noted  . Closed comminuted intertrochanteric fracture of proximal end of right femur (HCC) 05/14/2018  . Dementia (HCC)   . Pain in the chest   . Chest pain 02/19/2015  . HTN (hypertension) 02/19/2015  . Hyperlipidemia 02/19/2015  . DM (diabetes mellitus), type 2 (HCC) 02/19/2015  . Hypothyroidism 02/19/2015  . GERD (gastroesophageal reflux disease) 02/19/2015  . Anxiety 02/19/2015  . Dizziness 11/02/2014  . Palpitations 11/02/2014    Orientation RESPIRATION BLADDER Height & Weight     Self, Time, Situation, Place  Normal Incontinent, External catheter Weight: 59 kg Height:  5' 4.02" (162.6 cm)  BEHAVIORAL SYMPTOMS/MOOD NEUROLOGICAL BOWEL NUTRITION STATUS      Continent Diet(see discharge summary)  AMBULATORY STATUS COMMUNICATION OF NEEDS Skin   Extensive Assist Verbally Surgical wounds(right leg closed surgical incision)                       Personal Care Assistance Level of Assistance  Bathing, Feeding, Dressing, Total care Bathing Assistance: Maximum assistance Feeding assistance: Independent Dressing Assistance: Maximum assistance Total Care Assistance: Maximum assistance   Functional Limitations Info  Sight,  Hearing, Speech Sight Info: Adequate Hearing Info: Adequate Speech Info: Adequate    SPECIAL CARE FACTORS FREQUENCY  OT (By licensed OT), PT (By licensed PT)     PT Frequency: min 5x weekly OT Frequency: min 3x weekly            Contractures Contractures Info: Not present    Additional Factors Info  Code Status, Allergies Code Status Info: full Allergies Info: Allergies:  Aspirin-caffeine, Ciprofloxacin, Sulfa Antibiotics           Current Medications (05/17/2018):  This is the current hospital active medication list Current Facility-Administered Medications  Medication Dose Route Frequency Provider Last Rate Last Dose  . bisacodyl (DULCOLAX) EC tablet 5 mg  5 mg Oral Daily PRN Ulyses Southward A, PA-C      . brimonidine (ALPHAGAN) 0.2 % ophthalmic solution 1 drop  1 drop Both Eyes BID Ulyses Southward A, PA-C   1 drop at 05/17/18 2025   And  . timolol (TIMOPTIC) 0.5 % ophthalmic solution 1 drop  1 drop Both Eyes BID Despina Hidden, PA-C   1 drop at 05/17/18 4270  . busPIRone (BUSPAR) tablet 7.5 mg  7.5 mg Oral Daily Ulyses Southward A, PA-C   7.5 mg at 05/17/18 6237   And  . busPIRone (BUSPAR) tablet 15 mg  15 mg Oral QHS Ulyses Southward A, PA-C   15 mg at 05/16/18 2140  . carvedilol (COREG) tablet 3.125 mg  3.125 mg Oral BID WC Ulyses Southward A, PA-C   3.125 mg at 05/17/18 6283  . enoxaparin (LOVENOX) injection 40 mg  40 mg Subcutaneous  Q24H Ulyses SouthwardYacobi, Sarah A, PA-C   40 mg at 05/17/18 0940  . feeding supplement (ENSURE ENLIVE) (ENSURE ENLIVE) liquid 237 mL  237 mL Oral BID BM Ulyses SouthwardYacobi, Sarah A, PA-C   237 mL at 05/17/18 0948  . hydrALAZINE (APRESOLINE) injection 10 mg  10 mg Intravenous Q6H PRN Emokpae, Courage, MD      . losartan (COZAAR) tablet 50 mg  50 mg Oral Daily Ulyses SouthwardYacobi, Sarah A, PA-C   50 mg at 05/17/18 [id number]   And  . hydrochlorothiazide (MICROZIDE) capsule 12.5 mg  12.5 mg Oral Daily Ulyses SouthwardYacobi, Sarah A, PA-C   12.5 mg at 05/17/18 [id number]  . HYDROcodone-acetaminophen (NORCO/VICODIN)  5-325 MG per tablet 1-2 tablet  1-2 tablet Oral Q6H PRN Despina HiddenYacobi, Sarah A, PA-C   2 tablet at 05/17/18 0950  . insulin aspart (novoLOG) injection 0-5 Units  0-5 Units Subcutaneous QHS Yacobi, Sarah A, PA-C      . insulin aspart (novoLOG) injection 0-5 Units  0-5 Units Subcutaneous QHS Despina HiddenYacobi, Sarah A, PA-C   2 Units at 05/16/18 2141  . insulin aspart (novoLOG) injection 0-9 Units  0-9 Units Subcutaneous TID WC Despina HiddenYacobi, Sarah A, PA-C   3 Units at 05/17/18 1203  . lactated ringers infusion   Intravenous Continuous Despina HiddenYacobi, Sarah A, PA-C 10 mL/hr at 05/17/18 [id number]    . levothyroxine (SYNTHROID, LEVOTHROID) tablet 50 mcg  50 mcg Oral QAC breakfast Despina HiddenYacobi, Sarah A, PA-C   50 mcg at 05/17/18 [id number]  . methocarbamol (ROBAXIN) tablet 500 mg  500 mg Oral Q6H PRN Despina HiddenYacobi, Sarah A, PA-C   500 mg at 05/15/18 2252   Or  . methocarbamol (ROBAXIN) 500 mg in dextrose 5 % 50 mL IVPB  500 mg Intravenous Q6H PRN Despina HiddenYacobi, Sarah A, PA-C      . morphine 2 MG/ML injection 0.5 mg  0.5 mg Intravenous Q2H PRN Ulyses SouthwardYacobi, Sarah A, PA-C   0.5 mg at 05/15/18 0941  . multivitamin with minerals tablet 1 tablet  1 tablet Oral Daily Despina HiddenYacobi, Sarah A, PA-C   1 tablet at 05/17/18 [id number]  . pantoprazole (PROTONIX) EC tablet 40 mg  40 mg Oral Daily Ulyses SouthwardYacobi, Sarah A, PA-C   40 mg at 05/17/18 0940  . polyethylene glycol (MIRALAX / GLYCOLAX) packet 17 g  17 g Oral Daily PRN Ulyses SouthwardYacobi, Sarah A, PA-C      . povidone-iodine 10 % swab 2 application  2 application Topical Once Despina HiddenYacobi, Sarah A, PA-C      . senna-docusate (Senokot-S) tablet 2 tablet  2 tablet Oral BID Despina HiddenYacobi, Sarah A, PA-C   2 tablet at 05/17/18 [id number]  . simvastatin (ZOCOR) tablet 20 mg  20 mg Oral q1800 Ulyses SouthwardYacobi, Sarah A, PA-C   20 mg at 05/16/18 1721  . traZODone (DESYREL) tablet 50 mg  50 mg Oral QHS Ulyses SouthwardYacobi, Sarah A, PA-C   50 mg at 05/16/18 2141  . trimethoprim (TRIMPEX) tablet 100 mg  100 mg Oral Daily Despina HiddenYacobi, Sarah A, PA-C   100 mg at 05/17/18 [id number]     Discharge Medications: Please see  discharge summary for a list of discharge medications.  Relevant Imaging Results:  Relevant Lab Results:   Additional Information SSN: [id number]  Gildardo GriffesAshley M Shuntia Exton, LCSW

## 2018-05-17 NOTE — Plan of Care (Signed)

## 2018-05-17 NOTE — Clinical Social Work Note (Signed)
Clinical Social Work Assessment  Patient Details  Name: Chelsea Roberson MRN: [id number] Date of Birth: [birth year]  Date of referral:  05/17/18               Reason for consult:  Discharge Planning                Permission sought to share information with:  Case Manager, Facility Medical sales representativeContact Representative, Family Supports Permission granted to share information::  Yes, Verbal Permission Granted  Name::     Scientific laboratory technicianobert  Agency::  SNFs  Relationship::  spouse  Contact Information:  [phone]  Housing/Transportation Living arrangements for the past 2 months:  Single Family Home Source of Information:  Patient Patient Interpreter Needed:  None Criminal Activity/Legal Involvement Pertinent to Current Situation/Hospitalization:  No - Comment as needed Significant Relationships:  Spouse Lives with:  Spouse Do you feel safe going back to the place where you live?  No Need for family participation in patient care:  Yes (Comment)  Care giving concerns:  CSW received referral for possible SNF placement at time of discharge. Spoke with patient regarding possibility of SNF placement . Patient's spouse at bedside   is currently unable to care for her at their home given patient's current needs and fall risk.  Patient and  Husband at bedside  expressed understanding of PT recommendation and are agreeable to SNF placement at time of discharge. CSW to continue to follow and assist with discharge planning needs.     Social Worker assessment / plan:  Spoke with patient and   Husband at bedside  con[facility].    Employment status:  Retired Database administratornsurance information:  Managed Medicare PT Recommendations:  Skilled Nursing Facility Information / Referral to community resources:  Skilled Nursing Facility  Patient/Family's Response to care:  Patient and husband     recognize need for rehab before returning home and are agreeable to SNF. They report preference for   [facility] in [place], however if this is unavailable they would like CSW to look at [facility] and [facility]. CSW provided patient and husband with medicare.gov quality measures and overall ratings for their top 3 preferences of SNF's . CSW explained insurance authorization process. Patient's family reported that they want patient to get stronger to be able to come back home.    Patient/Family's Understanding of and Emotional Response to Diagnosis, Current Treatment, and Prognosis:  Patient/family is realistic regarding therapy needs and expressed being hopeful for SNF placement. Patient expressed understanding of CSW role and discharge process as well as medical condition. No questions/concerns about plan or treatment.    Emotional Assessment Appearance:  Appears stated age Attitude/Demeanor/Rapport:  Engaged, Gracious Affect (typically observed):  Accepting, Adaptable Orientation:  Oriented to Self, Oriented to Place, Oriented to  Time, Oriented to Situation Alcohol / Substance use:  Not Applicable Psych involvement (Current and /or in the community):  No (Comment)  Discharge Needs  Concerns to be addressed:  Discharge Planning Concerns Readmission within the last 30 days:  No Current discharge risk:  Dependent with Mobility Barriers to Discharge:  Continued Medical Work up   Dynegyshley M Samyukta Cura, LCSW 05/17/2018, 12:29 PM

## 2018-05-17 NOTE — Progress Notes (Signed)
Physical Therapy Treatment Patient Details Name: Chelsea PeriFrances A Corales MRN: [id number] DOB: 03-16-1946 Today's Date: 05/17/2018    History of Present Illness 72 y.o. female with medical history significant of hypothyroidism; HTN; HLD; and DM presenting with R hip fracture from a fall. s/p R intertrocanteric IM nail 05/15/18.     PT Comments    Pt received supine in bed, husband at bedside. Pt agreeable to participate, pain well managed, has not been OOB this date. Pt less pain limited this date, still requires heavy physical assist for all mobility, and fatigues quite quickly. Pt able to progress AMB this date, 263ft the first time and 717ft the first time. Pt dizzy with prolonged standing but orthostatic vitals negative. Overall, pt slowly progressing.     Follow Up Recommendations  SNF     Equipment Recommendations       Recommendations for Other Services       Precautions / Restrictions Precautions Precautions: Fall Restrictions Weight Bearing Restrictions: Yes RLE Weight Bearing: Weight bearing as tolerated    Mobility  Bed Mobility Overal bed mobility: Needs Assistance       Supine to sit: Mod assist     General bed mobility comments: Unable to advance her RLE without physical assist, also trunk weakness limting upright posture  Transfers Overall transfer level: Needs assistance Equipment used: Standard walker Transfers: Sit to/from Stand Sit to Stand: Mod assist         General transfer comment: Performed 5x in session, requires 30-45s to establish standing adn come upright, VC to avoid Rt foot plcement outside fo RW.   Ambulation/Gait Ambulation/Gait assistance: Min assist Gait Distance (Feet): 7 Feet(first 483ft, then 7 ft) Assistive device: Rolling walker (2 wheeled)       General Gait Details: chair follow is a must for safety, very slow 3-point gait, step-to, difficulty with Lt stepping d/t pain and weakness. BP normo-static despite increasing  dizziness   Stairs             Wheelchair Mobility    Modified Rankin (Stroke Patients Only)       Balance Overall balance assessment: Needs assistance Sitting-balance support: No upper extremity supported;Feet supported;Single extremity supported Sitting balance-Leahy Scale: Poor Sitting balance - Comments: really struggles to maintain upright posture   Standing balance support: During functional activity;Single extremity supported Standing balance-Leahy Scale: Poor Standing balance comment: dependent on BUE, some posterio leaning and LOB intermittently.                             Cognition Arousal/Alertness: Awake/alert Behavior During Therapy: WFL for tasks assessed/performed Overall Cognitive Status: History of cognitive impairments - at baseline                                 General Comments: difficulty following cues when given, soemtimes confused, needs extensive tactile/verbal cues for therex      Exercises Other Exercises Other Exercises: Seated LAQ Rt x12; hevy cuing required for completion.  Other Exercises: Seated gravity resisted ankle PF: unble to complete d/t confusion.     General Comments        Pertinent Vitals/Pain Pain Assessment: (denies pain at rest, reports some mild pain with AMB and transfers but does not rate) Pain Intervention(s): Limited activity within patient's tolerance;Monitored during session;Repositioned    Home Living  Prior Function            PT Goals (current goals can now be found in the care plan section) Acute Rehab PT Goals Patient Stated Goal: go home with husband PT Goal Formulation: With patient Time For Goal Achievement: 05/30/18 Potential to Achieve Goals: Fair Progress towards PT goals: Progressing toward goals    Frequency    Min 3X/week      PT Plan Current plan remains appropriate    Co-evaluation              AM-PAC PT "6  Clicks" Mobility   Outcome Measure  Help needed turning from your back to your side while in a flat bed without using bedrails?: A Lot Help needed moving from lying on your back to sitting on the side of a flat bed without using bedrails?: A Lot Help needed moving to and from a bed to a chair (including a wheelchair)?: A Lot Help needed standing up from a chair using your arms (e.g., wheelchair or bedside chair)?: A Lot Help needed to walk in hospital room?: A Lot Help needed climbing 3-5 steps with a railing? : Total 6 Click Score: 11    End of Session Equipment Utilized During Treatment: Gait belt Activity Tolerance: Patient limited by pain;Patient limited by fatigue;Other (comment)(mentation) Patient left: in chair;with call bell/phone within reach;with chair alarm set Nurse Communication: Mobility status(vitals) PT Visit Diagnosis: Unsteadiness on feet (R26.81);Other abnormalities of gait and mobility (R26.89);Muscle weakness (generalized) (M62.81);History of falling (Z91.81);Repeated falls (R29.6);Difficulty in walking, not elsewhere classified (R26.2);Pain Pain - Right/Left: Right Pain - part of body: Hip     Time: 8657-8469 PT Time Calculation (min) (ACUTE ONLY): 26 min  Charges:  $Gait Training: 8-22 mins $Therapeutic Activity: 8-22 mins                     3:56 PM, 05/17/18 Rosamaria Lints, PT, DPT Physical Therapist - [facility] [phone] (Pager)  [phone] (Office)      Buccola,Allan C 05/17/2018, 3:53 PM

## 2018-05-18 LAB — GLUCOSE, CAPILLARY
Glucose-Capillary: 159 mg/dL — ABNORMAL HIGH (ref 70–99)
Glucose-Capillary: 208 mg/dL — ABNORMAL HIGH (ref 70–99)
Glucose-Capillary: 195 mg/dL — ABNORMAL HIGH (ref 70–99)
Glucose-Capillary: 186 mg/dL — ABNORMAL HIGH (ref 70–99)

## 2018-05-18 NOTE — Progress Notes (Signed)
CSW called [facility] to follow up on insurance authorization pre physician request. CSW was told by facility that there are no weekend staff in admissions that can follow up on insurance auth, they will be available Monday to do so.   CSW informed physician of this.   CSW will continue to follow up on Monday.  Sugar GroveAshley Ajayla Iglesias, [place] [phone]

## 2018-05-18 NOTE — Plan of Care (Signed)

## 2018-05-18 NOTE — Progress Notes (Signed)
Patient Demographics:    Chelsea Roberson Brune, is a 72 y.o. female, DOB - 07-30-1945, ZOX:[id number]  Admit date - 05/14/2018   Admitting Physician Jonah BlueJennifer Yates, MD  Outpatient Primary MD for the patient is Merlene LaughterStoneking, Hal, MD  LOS - 4   Chief Complaint  Patient presents with  . Fall  . Hip Pain        Subjective:    Chelsea Roberson Forinash today has no fevers, no emesis,  No chest pain, ambulated with physical therapy  Assessment  & Plan :    Principal Problem:   Closed comminuted intertrochanteric fracture of proximal end of right femur (HCC) Active Problems:   HTN (hypertension)   Hyperlipidemia   DM (diabetes mellitus), type 2 (HCC)   Hypothyroidism   Dementia (HCC)  Brief Summary 72 y.o. female with medical history significant of hypothyroidism; HTN; HLD; and DM admitted on 05/14/2018 with right hip pain/right hip fracture after mechanical fall, status post right hip Cephalomedullary nailing of right intertrochanteric femur on 05/15/2018  05/18/18 update----no significant change in medical condition at this time awaiting insurance approval for transfer to SNF rehab  Plan:- 1)DM2-A1c 6.0, reflecting excellent diabetic control, prior to admission patient was on metformin/Actos,   Use Novolog/Humalog Sliding scale insulin with Accu-Cheks /Fingersticks as ordered  2)HTN-stable, c/n  Coreg 12.5 mg twice daily, c/n  losartan/HCTZ,   may use IV Hydralazine 10 mg  Every 4 hours Prn for systolic blood pressure over 170 mmhg  3)Hypothyroidism-patient appears euthyroid, TSH is 2.0, continue levothyroxine 50 mcg daily  4)Anxiety/Insomnia--- stable, continue buspirone, also continue trazodone for sleep  5)Rt Hip Fx--right hip repair mechanical Fall, s/p ORIF on 05/15/18,   Weightbearing: WBAT RLE                  Insicional and dressing care: Dressing clean, dry, intact. No drainage. Dressing changes as advised .    6)Hypokalemia--- most likely related to HCTZ use, replace potassium orally and recheck BMP  Disposition/Need for in-Hospital Stay- patient unable to be discharged at this time due to due to right hip fracture -awaiting insurance approval for transfer to SNF rehab post-op   Code Status : full   Disposition Plan  : SNF rehab   Consults  :  ortho  DVT Prophylaxis  :  Lovenox  As per ortho SCDs  Lab Results  Component Value Date   PLT 172 05/17/2018   Inpatient Medications  Scheduled Meds: . brimonidine  1 drop Both Eyes BID   And  . timolol  1 drop Both Eyes BID  . busPIRone  7.5 mg Oral Daily   And  . busPIRone  15 mg Oral QHS  . carvedilol  3.125 mg Oral BID WC  . enoxaparin (LOVENOX) injection  40 mg Subcutaneous Q24H  . feeding supplement (ENSURE ENLIVE)  237 mL Oral BID BM  . losartan  50 mg Oral Daily   And  . hydrochlorothiazide  12.5 mg Oral Daily  . insulin aspart  0-5 Units Subcutaneous QHS  . insulin aspart  0-5 Units Subcutaneous QHS  . insulin aspart  0-9 Units Subcutaneous TID WC  . levothyroxine  50 mcg Oral QAC breakfast  . multivitamin with minerals  1 tablet Oral Daily  .  pantoprazole  40 mg Oral Daily  . povidone-iodine  2 application Topical Once  . senna-docusate  2 tablet Oral BID  . simvastatin  20 mg Oral q1800  . traZODone  50 mg Oral QHS  . trimethoprim  100 mg Oral Daily   Continuous Infusions: . lactated ringers 10 mL/hr at 05/17/18 0623  . methocarbamol (ROBAXIN) IV     PRN Meds:.bisacodyl, hydrALAZINE, HYDROcodone-acetaminophen, methocarbamol **OR** methocarbamol (ROBAXIN) IV, morphine injection, polyethylene glycol  Anti-infectives (From admission, onward)   Start     Dose/Rate Route Frequency Ordered Stop   05/15/18 2145  ceFAZolin (ANCEF) IVPB 2g/100 mL premix     2 g 200 mL/hr over 30 Minutes Intravenous Every 8 hours 05/15/18 1742 05/16/18 1434   05/15/18 1445  vancomycin (VANCOCIN) powder  Status:  Discontinued       As  needed 05/15/18 1445 05/15/18 1509   05/15/18 1045  ceFAZolin (ANCEF) IVPB 2g/100 mL premix     2 g 200 mL/hr over 30 Minutes Intravenous On call to O.R. 05/14/18 1923 05/15/18 1414   05/15/18 1000  trimethoprim (TRIMPEX) tablet 100 mg     100 mg Oral Daily 05/14/18 1406     05/14/18 1145  ceFAZolin (ANCEF) IVPB 2g/100 mL premix  Status:  Discontinued     2 g 200 mL/hr over 30 Minutes Intravenous On call to O.R. 05/14/18 1138 05/14/18 1954      Objective:   Vitals:   05/17/18 1333 05/17/18 1657 05/17/18 2053 05/18/18 0511  BP: (!) 99/59 (!) 108/51 (!) 122/52 (!) 155/61  Pulse: 69 76 74 86  Resp:   20 20  Temp: 98.8 F (37.1 C)  98.7 F (37.1 C) 98.8 F (37.1 C)  TempSrc: Oral  Oral Oral  SpO2: 96%  98% 98%  Weight:      Height:       Wt Readings from Last 3 Encounters:  05/15/18 59 kg  05/09/16 59 kg  03/02/15 91.2 kg    Intake/Output Summary (Last 24 hours) at 05/18/2018 1713 Last data filed at 05/18/2018 1300 Gross per 24 hour  Intake 480 ml  Output 600 ml  Net -120 ml    Physical Exam Patient is examined daily including today on 05/18/18 , exams remain the same as of yesterday except that has changed   Gen:- Awake Alert,  In no apparent distress  HEENT:- [place].AT, No sclera icterus Neck-Supple Neck,No JVD,.  Lungs-  CTAB , fair symmetrical air movement CV- S1, S2 normal, regular  Abd-  +ve B.Sounds, Abd Soft, No tenderness,    Extremity/Skin:- No  edema, pedal pulses present  Psych-affect is appropriate, oriented x3 Neuro-no new focal deficits, no tremors MSK- Rt Hip  s/p ORIF wound is Clean/Intact, appropriate postop tenderness   Data Review:   Micro Results Recent Results (from the past 240 hour(s))  MRSA PCR Screening     Status: None   Collection Time: 05/14/18  8:25 PM  Result Value Ref Range Status   MRSA by PCR NEGATIVE NEGATIVE Final    Comment:        The GeneXpert MRSA Assay (FDA approved for NASAL specimens only), is one component of  a comprehensive MRSA colonization surveillance program. It is not intended to diagnose MRSA infection nor to guide or monitor treatment for MRSA infections. Performed at [facility], [address], [place], [place] [address]     Radiology Reports Dg Chest 1 View  Result Date: 05/14/2018 CLINICAL DATA:  Fell today.  Right hip fracture. EXAM: CHEST  1 VIEW COMPARISON:  09/03/2017 FINDINGS: The cardiac silhouette, mediastinal and hilar contours are within normal limits and stable. The lungs are clear. No pleural effusion. The bony structures are intact. Suspect remote left humeral neck fracture. IMPRESSION: No acute cardiopulmonary findings and intact bony thorax. Electronically Signed   By: Rudie Meyer M.D.   On: 05/14/2018 10:31   Dg Pelvis Portable  Result Date: 05/15/2018 CLINICAL DATA:  Postop EXAM: PORTABLE PELVIS 1-2 VIEWS COMPARISON:  05/14/2018 FINDINGS: Internal fixation across the right femoral intertrochanteric fracture. Anatomic alignment. No hardware bony complicating feature. IMPRESSION: Internal fixation. No visible complicating feature. Anatomic alignment. Electronically Signed   By: Charlett Nose M.D.   On: 05/15/2018 19:32   Dg C-arm 1-60 Min  Result Date: 05/15/2018 CLINICAL DATA:  Open reduction internal fixation of right hip fracture EXAM: DG C-ARM 61-120 MIN; OPERATIVE RIGHT HIP WITH PELVIS COMPARISON:  05/14/2018 FINDINGS: Intraoperative fluoroscopic images demonstrate interval intramedullary rod and screw fixation of right intertrochanteric femoral fracture. Normal alignment of the osseous structures and the orthopedic hardware. No new fractures are seen. IMPRESSION: Status post intramedullary rod fixation of right intertrochanteric femoral fracture without evidence of immediate complications. Electronically Signed   By: Ted Mcalpine M.D.   On: 05/15/2018 16:26   Dg Hip Operative Unilat W Or W/o Pelvis Right  Result Date: 05/15/2018 CLINICAL DATA:  Open  reduction internal fixation of right hip fracture EXAM: DG C-ARM 61-120 MIN; OPERATIVE RIGHT HIP WITH PELVIS COMPARISON:  05/14/2018 FINDINGS: Intraoperative fluoroscopic images demonstrate interval intramedullary rod and screw fixation of right intertrochanteric femoral fracture. Normal alignment of the osseous structures and the orthopedic hardware. No new fractures are seen. IMPRESSION: Status post intramedullary rod fixation of right intertrochanteric femoral fracture without evidence of immediate complications. Electronically Signed   By: Ted Mcalpine M.D.   On: 05/15/2018 16:26   Dg Hip Unilat With Pelvis 2-3 Views Right  Result Date: 05/14/2018 CLINICAL DATA:  Larey Seat today.  Right hip pain. EXAM: DG HIP (WITH OR WITHOUT PELVIS) 2-3V RIGHT COMPARISON:  Radiographs 11/11/2014 FINDINGS: There is a minimally displaced intertrochanteric fracture of the right hip. No femoral neck fracture. The bony pelvis is intact. No pelvic fractures. IMPRESSION: Mildly displaced intertrochanteric fracture of the right hip. Electronically Signed   By: Rudie Meyer M.D.   On: 05/14/2018 10:32   Dg Femur Min 2 Views Right  Result Date: 05/14/2018 CLINICAL DATA:  Larey Seat today.  Right leg pain. EXAM: RIGHT FEMUR 2 VIEWS COMPARISON:  None. FINDINGS: Intertrochanteric right hip fracture is noted. No femoral shaft fracture. IMPRESSION: Intertrochanteric fracture of the right hip. No femoral shaft fracture. Electronically Signed   By: Rudie Meyer M.D.   On: 05/14/2018 10:33    CBC Recent Labs  Lab 05/14/18 1013 05/16/18 0227 05/17/18 0807  WBC 6.4 7.5 7.5  HGB 12.4 11.3* 10.6*  HCT 39.4 35.4* 32.5*  PLT 180 184 172  MCV 100.3* 98.6 97.9  MCH 31.6 31.5 31.9  MCHC 31.5 31.9 32.6  RDW 14.0 14.1 14.4  LYMPHSABS 1.2  --   --   MONOABS 0.4  --   --   EOSABS 0.0  --   --   BASOSABS 0.0  --   --     Chemistries  Recent Labs  Lab 05/14/18 1041 05/17/18 0807  NA 138 138  K 3.5 3.0*  CL 101 99  CO2 27 28   GLUCOSE 146* 155*  BUN 13  12  CREATININE 0.70 0.51  CALCIUM 9.1 8.6*    Lab Results  Component Value Date   HGBA1C 6.0 (H) 05/15/2018   ------------------------------------------------------------------------------------------------------------------ No results for input(s): TSH, T4TOTAL, T3FREE, THYROIDAB in the last 72 hours.  Invalid input(s): Hoy Register M.D on 05/18/2018 at 5:13 PM  Pager---[phone] Go to [email or web address] - password [email or web address] for contact info  [facility] - Office  [phone]

## 2018-05-18 NOTE — Plan of Care (Signed)
  Problem: Activity: Goal: Risk for activity intolerance will decrease Outcome: Progressing   Problem: Coping: Goal: Level of anxiety will decrease Outcome: Progressing   Problem: Pain Managment: Goal: General experience of comfort will improve Outcome: Progressing   Problem: Safety: Goal: Ability to remain free from injury will improve Outcome: Progressing   Problem: Skin Integrity: Goal: Risk for impaired skin integrity will decrease Outcome: Progressing   

## 2018-05-19 LAB — BASIC METABOLIC PANEL
ANION GAP: 8 (ref 5–15)
BUN: 19 mg/dL (ref 8–23)
CO2: 30 mmol/L (ref 22–32)
Calcium: 8.2 mg/dL — ABNORMAL LOW (ref 8.9–10.3)
Chloride: 98 mmol/L (ref 98–111)
Creatinine, Ser: 0.61 mg/dL (ref 0.44–1.00)
GFR calc Af Amer: >60 mL/min (ref >60)
GFR calc non Af Amer: >60 mL/min (ref >60)
GLUCOSE: 187 mg/dL — AB (ref 70–99)
POTASSIUM: 4 mmol/L (ref 3.5–5.1)
Sodium: 136 mmol/L (ref 135–145)

## 2018-05-19 LAB — CBC
HCT: 30 % — ABNORMAL LOW (ref 36.0–46.0)
Hemoglobin: 9.7 g/dL — ABNORMAL LOW (ref 12.0–15.0)
MCH: 32.1 pg (ref 26.0–34.0)
MCHC: 32.3 g/dL (ref 30.0–36.0)
MCV: 99.3 fL (ref 80.0–100.0)
NRBC: 0 % (ref 0.0–0.2)
PLATELETS: 170 10*3/uL (ref 150–400)
RBC: 3.02 MIL/uL — AB (ref 3.87–5.11)
RDW: 14.2 % (ref 11.5–15.5)
WBC: 5.2 10*3/uL (ref 4.0–10.5)

## 2018-05-19 LAB — GLUCOSE, CAPILLARY
GLUCOSE-CAPILLARY: 141 mg/dL — AB (ref 70–99)
GLUCOSE-CAPILLARY: 183 mg/dL — AB (ref 70–99)
Glucose-Capillary: 163 mg/dL — ABNORMAL HIGH (ref 70–99)
Glucose-Capillary: 270 mg/dL — ABNORMAL HIGH (ref 70–99)

## 2018-05-19 MED ORDER — POLYETHYLENE GLYCOL 3350 17 G PO PACK
17.0000 g | PACK | Freq: Once | ORAL | Status: AC
  Administered 2018-05-19: 17 g via ORAL
  Filled 2018-05-19: qty 1

## 2018-05-19 NOTE — Plan of Care (Signed)

## 2018-05-19 NOTE — Progress Notes (Signed)
Patient Demographics:    Chelsea Roberson, is a 72 y.o. female, DOB - 1946/01/20, ZOX:[id number]  Admit date - 05/14/2018   Admitting Physician Jonah Blue, MD  Outpatient Primary MD for the patient is Merlene Laughter, MD  LOS - 5   Chief Complaint  Patient presents with  . Fall  . Hip Pain        Subjective:    [name] today has no fevers, no emesis,  No chest , resting comfortably  Assessment  & Plan :    Principal Problem:   Closed comminuted intertrochanteric fracture of proximal end of right femur (HCC) Active Problems:   HTN (hypertension)   Hyperlipidemia   DM (diabetes mellitus), type 2 (HCC)   Hypothyroidism   Dementia (HCC)  Brief Summary 72 y.o. female with medical history significant of hypothyroidism; HTN; HLD; and DM admitted on 05/14/2018 with right hip pain/right hip fracture after mechanical fall, status post right hip Cephalomedullary nailing of right intertrochanteric femur on 05/15/2018  05/19/18 update----no significant change in medical condition at this time awaiting insurance approval for transfer to SNF rehab  Plan:- 1)DM2-A1c 6.0, reflecting excellent diabetic control, prior to admission patient was on metformin/Actos,   Use Novolog/Humalog Sliding scale insulin with Accu-Cheks /Fingersticks as ordered  2)HTN-stable, c/n  Coreg 12.5 mg twice daily, c/n  losartan/HCTZ,   may use IV Hydralazine 10 mg  Every 4 hours Prn for systolic blood pressure over 170 mmhg  3)Hypothyroidism-patient appears euthyroid, TSH is 2.0, continue levothyroxine 50 mcg daily  4)Anxiety/Insomnia--- stable, continue buspirone, also continue trazodone for sleep  5)Rt Hip Fx--right hip repair mechanical Fall, s/p ORIF on 05/15/18,   Weightbearing: WBAT RLE                  Insicional and dressing care: Dressing clean, dry, intact. No drainage. Dressing changes as advised .    6)Hypokalemia--- most likely related to HCTZ use, replace potassium orally and recheck BMP  7) acute blood loss anemia--- baseline hemoglobin around 12, hemoglobin down to 9.7 due to acute blood loss related to her hip fracture and hip surgery compounded by hemodilution from IV fluids, continue to monitor and transfuse as clinically indicated  Disposition/Need for in-Hospital Stay- patient unable to be discharged at this time due to due to right hip fracture -awaiting insurance approval for transfer to SNF rehab post-op   Code Status : full   Disposition Plan  : SNF rehab   Consults  :  ortho  DVT Prophylaxis  :  Lovenox  As per ortho SCDs  Lab Results  Component Value Date   PLT 170 05/19/2018   Inpatient Medications  Scheduled Meds: . brimonidine  1 drop Both Eyes BID   And  . timolol  1 drop Both Eyes BID  . busPIRone  7.5 mg Oral Daily   And  . busPIRone  15 mg Oral QHS  . carvedilol  3.125 mg Oral BID WC  . enoxaparin (LOVENOX) injection  40 mg Subcutaneous Q24H  . feeding supplement (ENSURE ENLIVE)  237 mL Oral BID BM  . losartan  50 mg Oral Daily   And  . hydrochlorothiazide  12.5 mg Oral Daily  . insulin aspart  0-5 Units Subcutaneous QHS  .  insulin aspart  0-5 Units Subcutaneous QHS  . insulin aspart  0-9 Units Subcutaneous TID WC  . levothyroxine  50 mcg Oral QAC breakfast  . multivitamin with minerals  1 tablet Oral Daily  . pantoprazole  40 mg Oral Daily  . povidone-iodine  2 application Topical Once  . senna-docusate  2 tablet Oral BID  . simvastatin  20 mg Oral q1800  . traZODone  50 mg Oral QHS  . trimethoprim  100 mg Oral Daily   Continuous Infusions: . lactated ringers 10 mL/hr at 05/17/18 0623  . methocarbamol (ROBAXIN) IV     PRN Meds:.bisacodyl, hydrALAZINE, HYDROcodone-acetaminophen, methocarbamol **OR** methocarbamol (ROBAXIN) IV, morphine injection  Anti-infectives (From admission, onward)   Start     Dose/Rate Route Frequency Ordered  Stop   05/15/18 2145  ceFAZolin (ANCEF) IVPB 2g/100 mL premix     2 g 200 mL/hr over 30 Minutes Intravenous Every 8 hours 05/15/18 1742 05/16/18 1434   05/15/18 1445  vancomycin (VANCOCIN) powder  Status:  Discontinued       As needed 05/15/18 1445 05/15/18 1509   05/15/18 1045  ceFAZolin (ANCEF) IVPB 2g/100 mL premix     2 g 200 mL/hr over 30 Minutes Intravenous On call to O.R. 05/14/18 1923 05/15/18 1414   05/15/18 1000  trimethoprim (TRIMPEX) tablet 100 mg     100 mg Oral Daily 05/14/18 1406     05/14/18 1145  ceFAZolin (ANCEF) IVPB 2g/100 mL premix  Status:  Discontinued     2 g 200 mL/hr over 30 Minutes Intravenous On call to O.R. 05/14/18 1138 05/14/18 1954      Objective:   Vitals:   05/18/18 1719 05/18/18 2112 05/19/18 0411 05/19/18 1600  BP: 108/67 (!) 111/52 (!) 128/59 134/63  Pulse: 79 80 64 73  Resp: 16 18 18 18   Temp: 98.1 F (36.7 C) 97.9 F (36.6 C) 98.7 F (37.1 C) 99.2 F (37.3 C)  TempSrc: Oral Oral Oral Oral  SpO2: 98% 94%  97%  Weight:      Height:       Wt Readings from Last 3 Encounters:  05/15/18 59 kg  05/09/16 59 kg  03/02/15 91.2 kg    Intake/Output Summary (Last 24 hours) at 05/19/2018 1622 Last data filed at 05/18/2018 1700 Gross per 24 hour  Intake 240 ml  Output -  Net 240 ml    Physical Exam Patient is examined daily including today on 05/19/18 , exams remain the same as of yesterday except that has changed   Gen:- Awake Alert,  In no apparent distress  HEENT:- [place].AT, No sclera icterus Neck-Supple Neck,No JVD,.  Lungs-  CTAB , fair symmetrical air movement CV- S1, S2 normal, regular  Abd-  +ve B.Sounds, Abd Soft, No tenderness,    Extremity/Skin:- No  edema, pedal pulses present  Psych-affect is appropriate, oriented x3 Neuro-no new focal deficits, no tremors MSK- Rt Hip  s/p ORIF wound is Clean/Intact, appropriate postop tenderness   Data Review:   Micro Results Recent Results (from the past 240 hour(s))  MRSA PCR Screening      Status: None   Collection Time: 05/14/18  8:25 PM  Result Value Ref Range Status   MRSA by PCR NEGATIVE NEGATIVE Final    Comment:        The GeneXpert MRSA Assay (FDA approved for NASAL specimens only), is one component of a comprehensive MRSA colonization surveillance program. It is not intended to diagnose MRSA infection nor to guide  or monitor treatment for MRSA infections. Performed at [facility], [address] [place], [place] [address]     Radiology Reports Dg Chest 1 View  Result Date: 05/14/2018 CLINICAL DATA:  Larey Seat today.  Right hip fracture. EXAM: CHEST  1 VIEW COMPARISON:  09/03/2017 FINDINGS: The cardiac silhouette, mediastinal and hilar contours are within normal limits and stable. The lungs are clear. No pleural effusion. The bony structures are intact. Suspect remote left humeral neck fracture. IMPRESSION: No acute cardiopulmonary findings and intact bony thorax. Electronically Signed   By: Rudie Meyer M.D.   On: 05/14/2018 10:31   Dg Pelvis Portable  Result Date: 05/15/2018 CLINICAL DATA:  Postop EXAM: PORTABLE PELVIS 1-2 VIEWS COMPARISON:  05/14/2018 FINDINGS: Internal fixation across the right femoral intertrochanteric fracture. Anatomic alignment. No hardware bony complicating feature. IMPRESSION: Internal fixation. No visible complicating feature. Anatomic alignment. Electronically Signed   By: Charlett Nose M.D.   On: 05/15/2018 19:32   Dg C-arm 1-60 Min  Result Date: 05/15/2018 CLINICAL DATA:  Open reduction internal fixation of right hip fracture EXAM: DG C-ARM 61-120 MIN; OPERATIVE RIGHT HIP WITH PELVIS COMPARISON:  05/14/2018 FINDINGS: Intraoperative fluoroscopic images demonstrate interval intramedullary rod and screw fixation of right intertrochanteric femoral fracture. Normal alignment of the osseous structures and the orthopedic hardware. No new fractures are seen. IMPRESSION: Status post intramedullary rod fixation of right intertrochanteric  femoral fracture without evidence of immediate complications. Electronically Signed   By: Ted Mcalpine M.D.   On: 05/15/2018 16:26   Dg Hip Operative Unilat W Or W/o Pelvis Right  Result Date: 05/15/2018 CLINICAL DATA:  Open reduction internal fixation of right hip fracture EXAM: DG C-ARM 61-120 MIN; OPERATIVE RIGHT HIP WITH PELVIS COMPARISON:  05/14/2018 FINDINGS: Intraoperative fluoroscopic images demonstrate interval intramedullary rod and screw fixation of right intertrochanteric femoral fracture. Normal alignment of the osseous structures and the orthopedic hardware. No new fractures are seen. IMPRESSION: Status post intramedullary rod fixation of right intertrochanteric femoral fracture without evidence of immediate complications. Electronically Signed   By: Ted Mcalpine M.D.   On: 05/15/2018 16:26   Dg Hip Unilat With Pelvis 2-3 Views Right  Result Date: 05/14/2018 CLINICAL DATA:  Larey Seat today.  Right hip pain. EXAM: DG HIP (WITH OR WITHOUT PELVIS) 2-3V RIGHT COMPARISON:  Radiographs 11/11/2014 FINDINGS: There is a minimally displaced intertrochanteric fracture of the right hip. No femoral neck fracture. The bony pelvis is intact. No pelvic fractures. IMPRESSION: Mildly displaced intertrochanteric fracture of the right hip. Electronically Signed   By: Rudie Meyer M.D.   On: 05/14/2018 10:32   Dg Femur Min 2 Views Right  Result Date: 05/14/2018 CLINICAL DATA:  Larey Seat today.  Right leg pain. EXAM: RIGHT FEMUR 2 VIEWS COMPARISON:  None. FINDINGS: Intertrochanteric right hip fracture is noted. No femoral shaft fracture. IMPRESSION: Intertrochanteric fracture of the right hip. No femoral shaft fracture. Electronically Signed   By: Rudie Meyer M.D.   On: 05/14/2018 10:33    CBC Recent Labs  Lab 05/14/18 1013 05/16/18 0227 05/17/18 0807 05/19/18 0233  WBC 6.4 7.5 7.5 5.2  HGB 12.4 11.3* 10.6* 9.7*  HCT 39.4 35.4* 32.5* 30.0*  PLT 180 184 172 170  MCV 100.3* 98.6 97.9 99.3  MCH  31.6 31.5 31.9 32.1  MCHC 31.5 31.9 32.6 32.3  RDW 14.0 14.1 14.4 14.2  LYMPHSABS 1.2  --   --   --   MONOABS 0.4  --   --   --   EOSABS  0.0  --   --   --   BASOSABS 0.0  --   --   --     Chemistries  Recent Labs  Lab 05/14/18 1041 05/17/18 0807 05/19/18 0233  NA 138 138 136  K 3.5 3.0* 4.0  CL 101 99 98  CO2 27 28 30   GLUCOSE 146* 155* 187*  BUN 13 12 19   CREATININE 0.70 0.51 0.61  CALCIUM 9.1 8.6* 8.2*    Lab Results  Component Value Date   HGBA1C 6.0 (H) 05/15/2018     Shon Haleourage Welford Christmas M.D on 05/19/2018 at 4:22 PM  Pager---[phone] Go to [email or web address] - password [email or web address] for contact info  [facility] - Office  [phone]

## 2018-05-20 DIAGNOSIS — I10 Essential (primary) hypertension: Secondary | ICD-10-CM

## 2018-05-20 LAB — GLUCOSE, CAPILLARY
Glucose-Capillary: 171 mg/dL — ABNORMAL HIGH (ref 70–99)
Glucose-Capillary: 254 mg/dL — ABNORMAL HIGH (ref 70–99)

## 2018-05-20 MED ORDER — SENNOSIDES-DOCUSATE SODIUM 8.6-50 MG PO TABS: 2.0000 | ORAL_TABLET | Freq: Two times a day (BID) | ORAL | 2 refills | Status: DC

## 2018-05-20 MED ORDER — ASPIRIN 81 MG PO TBEC: 81.0000 mg | DELAYED_RELEASE_TABLET | Freq: Two times a day (BID) | ORAL | 2 refills | Status: DC

## 2018-05-20 MED ORDER — ENOXAPARIN SODIUM 40 MG/0.4ML ~~LOC~~ SOLN: 40.0000 mg | SUBCUTANEOUS | 0 refills | Status: DC

## 2018-05-20 MED ORDER — METHOCARBAMOL 500 MG PO TABS: 500.0000 mg | ORAL_TABLET | Freq: Three times a day (TID) | ORAL | 1 refills | Status: DC

## 2018-05-20 MED ORDER — HYDROCODONE-ACETAMINOPHEN 5-325 MG PO TABS: 1.0000 | ORAL_TABLET | Freq: Four times a day (QID) | ORAL | 0 refills | Status: DC | PRN

## 2018-05-20 MED ORDER — ENSURE ENLIVE PO LIQD: 237.0000 mL | Freq: Two times a day (BID) | ORAL | 0 refills | Status: DC

## 2018-05-20 NOTE — Plan of Care (Signed)
Problem: Education: Goal: Knowledge of General Education information will improve Description Including pain rating scale, medication(s)/side effects and non-pharmacologic comfort measures Outcome: Progressing   Problem: Health Behavior/Discharge Planning: Goal: Ability to manage health-related needs will improve Outcome: Progressing   Problem: Clinical Measurements: Goal: Respiratory complications will improve Outcome: Progressing   Problem: Activity: Goal: Risk for activity intolerance will decrease Outcome: Progressing   Problem: Nutrition: Goal: Adequate nutrition will be maintained Outcome: Progressing   Problem: Coping: Goal: Level of anxiety will decrease Outcome: Progressing   Problem: Elimination: Goal: Will not experience complications related to urinary retention Outcome: Progressing   Problem: Pain Managment: Goal: General experience of comfort will improve Outcome: Progressing   Problem: Safety: Goal: Ability to remain free from injury will improve Outcome: Progressing   Problem: Skin Integrity: Goal: Risk for impaired skin integrity will decrease Outcome: Progressing   

## 2018-05-20 NOTE — Clinical Social Work Placement (Signed)
   CLINICAL SOCIAL WORK PLACEMENT  NOTE  Date:  05/20/2018  Patient Details  Name: Kirstie PeriFrances A Sammon MRN: [id number] Date of Birth: [birth year]  Clinical Social Work is seeking post-discharge placement for this patient at the Skilled  Nursing Facility level of care (*CSW will initial, date and re-position this form in  chart as items are completed):      Patient/family provided with [facility] Clinical Social Work Department's list of facilities offering this level of care within the geographic area requested by the patient (or if unable, by the patient's family).  Yes   Patient/family informed of their freedom to choose among providers that offer the needed level of care, that participate in Medicare, Medicaid or managed care program needed by the patient, have an available bed and are willing to accept the patient.      Patient/family informed of [facility]'s ownership interest in [facility] and [facility], as well as of the fact that they are under no obligation to receive care at these facilities.  PASRR submitted to EDS on       PASRR number received on 05/17/18     Existing PASRR number confirmed on       FL2 transmitted to all facilities in geographic area requested by pt/family on 05/17/18     FL2 transmitted to all facilities within larger geographic area on       Patient informed that his/her managed care company has contracts with or will negotiate with certain facilities, including the following:        Yes   Patient/family informed of bed offers received.  Patient chooses bed at [facility]     Physician recommends and patient chooses bed at      Patient to be transferred to [facility] on 05/20/18.  Patient to be transferred to facility by [facility]     Patient family notified on 05/20/18 of transfer.  Name of family member notified:  Faythe Dingwallobert Whiten     PHYSICIAN       Additional Comment:     _______________________________________________ Gildardo GriffesAshley M Rifka Ramey, LCSW 05/20/2018, 1:11 PM

## 2018-05-20 NOTE — Discharge Summary (Signed)
Chelsea Roberson, is a 72 y.o. female  DOB [birth year]  MRN [id number].  Admission date:  05/14/2018  Admitting Physician  Jonah BlueJennifer Yates, MD  Discharge Date:  05/20/2018   Primary MD  Merlene LaughterStoneking, Hal, MD  Recommendations for primary care physician for things to follow:   1)Novolog insulin aspart (novoLOG) injection sliding scale  0-9 Units 0-9 Units, Subcutaneous, 3 times daily with meals, First dose on Wed 05/15/18 at 1200 Correction coverage: Sensitive (thin, NPO, renal) CBG < 70: implement hypoglycemia protocol CBG 70 - 120: 0 units CBG 121 - 150: 1 unit CBG 151 - 200: 2 units CBG 201 - 250: 3 units CBG 251 - 300: 5 units CBG 301 - 350: 7 units CBG 351 - 400: 9 units CBG > 400: call MD   2)You are taking Lovenox/Enoxaparin for DVT prophylaxis so Avoid ibuprofen/Advil/Aleve/Motrin/Goody Powders / Naproxen / BC powders / Meloxicam / Diclofenac/Indomethacin and other Nonsteroidal anti-inflammatory medications as these will make you more likely to bleed and can cause stomach ulcers, can also cause Kidney problems.   3)Repeat CBC in 1 week  4) follow-up with orthopedic surgeon as advised  5)Weightbearing:WBAT RLE Insicional and dressing care: Dressing clean, dry, intact. No drainage. Dressing changes as advised   6) recheck BMP within a week due to tendency to was hypokalemia in the setting of HCTZ use  Admission Diagnosis  Fall [W19.XXXA] Essential hypertension [I10] Closed fracture of right hip, initial encounter (HCC) [S72.001A] Dementia without behavioral disturbance, unspecified dementia type [facility](HCC) [F03.90]   Discharge Diagnosis  Fall [W19.XXXA] Essential hypertension [I10] Closed fracture of right hip, initial encounter (HCC) [S72.001A] Dementia without behavioral disturbance, unspecified dementia type (HCC) [F03.90]    Principal Problem:   Closed comminuted  intertrochanteric fracture of proximal end of right femur (HCC) Active Problems:   HTN (hypertension)   Hyperlipidemia   DM (diabetes mellitus), type 2 (HCC)   Hypothyroidism   Dementia (HCC)      Past Medical History:  Diagnosis Date  . Anxiety   . Arthritis   . Closed right hip fracture, initial encounter (HCC) 05/14/2018  . Complication of anesthesia   . Dementia (HCC)   . DM (diabetes mellitus), type 2 (HCC)   . GERD (gastroesophageal reflux disease)   . Hyperlipidemia   . Hypertension   . Hypothyroidism   . PONV (postoperative nausea and vomiting)     Past Surgical History:  Procedure Laterality Date  . COLONOSCOPY WITH PROPOFOL N/A 05/09/2016   Procedure: COLONOSCOPY WITH PROPOFOL;  Surgeon: Charolett BumpersMartin K Johnson, MD;  Location: [facility];  Service: Endoscopy;  Laterality: N/A;  . FEMUR IM NAIL Right 05/15/2018   Procedure: INTRAMEDULLARY (IM) NAIL FEMORAL;  Surgeon: Roby LoftsHaddix, Kevin P, MD;  Location: [facility] OR;  Service: Orthopedics;  Laterality: Right;  . PARTIAL HYSTERECTOMY         HPI  from the history and physical done on the day of admission:   Patient coming from:  Home - lives with husband; NOK: Husband, [phone]  Chief Complaint: Marletta LorFall  HPI: Chelsea Roberson is a 72 y.o. female with medical history significant of hypothyroidism; HTN; HLD; and DM presenting with R hip fracture from a fall.  They were going to breakfast this AM.  Her husband started the car and she was walking off the porch like she does 3 times a day.  Her foot was wet and she slipped and fell on her right leg.  It hurts so much to move that she thought it was broken.  No prior hip fractures.  She was feeling very well prior to the fall.   ED Course:   R hip fracture from a mechanical fall.  Neurologically intact.  Ortho has seen, OR tomorrow.   Hospital Course:    Brief Summary 72 y.o.femalewith medical history significant ofhypothyroidism; HTN; HLD; and DM admitted on 05/14/2018 with  right hip pain/right hip fracture after mechanical fall, status post right hip Cephalomedullary nailing of right intertrochanteric femur on 05/15/2018   Plan:- 1)DM2-A1c 6.0, reflecting excellent diabetic control, prior to admission patient was on metformin/Actos, resume both  Use Novolog/Humalog Sliding scale insulin with Accu-Cheks /Fingersticks as ordered  2)HTN-stable, c/n  Coreg 12.5 mg twice daily, c/n  losartan/HCTZ,     3)Hypothyroidism-patient appears euthyroid, TSH is 2.0, continue levothyroxine 50 mcg daily  4)Anxiety/Insomnia--- stable, continue buspirone, also continue trazodone for sleep  5)Rt Hip Fx--right hip repair mechanical Fall, s/p ORIF on 05/15/18, Weightbearing:WBAT RLE Insicional and dressing care: Dressing clean, dry, intact. No drainage. Dressing changes as advised .   6)  7)Acute blood loss Anemia--- baseline hemoglobin around 12, hemoglobin down to 9.7 due to acute blood loss related to her hip fracture and hip surgery compounded by hemodilution from IV fluids, continue to monitor and transfuse as clinically indicated  Disposition/Need for in-Hospital Stay- patient unable to be discharged at this time due to due to right hip fracture -awaiting insurance approval for transfer to SNF rehab post-op   Code Status : full   Disposition Plan  : SNF rehab   Consults  :  Ortho  Discharge Condition: stable  Follow UP--- with orthopedic surgeon as advised  Diet and Activity recommendation:  As advised  Discharge Instructions    Discharge Instructions    Call MD for:  difficulty breathing, headache or visual disturbances   Complete by:  As directed    Call MD for:  hives   Complete by:  As directed    Call MD for:  persistant dizziness or light-headedness   Complete by:  As directed    Call MD for:  persistant nausea and vomiting   Complete by:  As directed    Call MD for:  redness, tenderness, or signs of infection (pain,  swelling, redness, odor or green/yellow discharge around incision site)   Complete by:  As directed    Call MD for:  severe uncontrolled pain   Complete by:  As directed    Call MD for:  temperature >100.4   Complete by:  As directed    Diet - low sodium heart healthy   Complete by:  As directed    Diet Carb Modified   Complete by:  As directed    Discharge instructions   Complete by:  As directed    1)Novolog insulin aspart (novoLOG) injection sliding scale  0-9 Units 0-9 Units, Subcutaneous, 3 times daily with meals, First dose on Wed 05/15/18 at 1200 Correction coverage: Sensitive (thin, NPO, renal) CBG < 70: implement hypoglycemia protocol CBG 70 - 120: 0  units CBG 121 - 150: 1 unit CBG 151 - 200: 2 units CBG 201 - 250: 3 units CBG 251 - 300: 5 units CBG 301 - 350: 7 units CBG 351 - 400: 9 units CBG > 400: call MD   2)You are taking Lovenox/Enoxaparin for DVT prophylaxis so Avoid ibuprofen/Advil/Aleve/Motrin/Goody Powders / Naproxen / BC powders / Meloxicam / Diclofenac/Indomethacin and other Nonsteroidal anti-inflammatory medications as these will make you more likely to bleed and can cause stomach ulcers, can also cause Kidney problems.   3)Repeat CBC in 1 week  4) follow-up with orthopedic surgeon as advised  5)Weightbearing:WBAT RLE Insicional and dressing care: Dressing clean, dry, intact. No drainage. Dressing changes as advised   6) recheck BMP within a week due to tendency to was hypokalemia in the setting of HCTZ use   Increase activity slowly   Complete by:  As directed         Discharge Medications     Allergies as of 05/20/2018      Reactions   Aspirin-caffeine Swelling, Other (See Comments)   SWELLING REACTION UNSPECIFIED    Ciprofloxacin Other (See Comments)   HEADACHE    Sulfa Antibiotics Nausea Only, Other (See Comments)      Medication List    STOP taking these medications   aspirin 81 MG EC tablet   ranitidine 150 MG  tablet Commonly known as:  ZANTAC     TAKE these medications   acetaminophen 650 MG CR tablet Commonly known as:  TYLENOL Take 650 mg by mouth every 8 (eight) hours as needed for pain.   alendronate 70 MG tablet Commonly known as:  FOSAMAX Take 70 mg by mouth once a week.   busPIRone 15 MG tablet Commonly known as:  BUSPAR Take 7.5-15 mg by mouth See admin instructions. 7.5mg  in the morning and 15mg  at bedtime   calcium-vitamin D 500-200 MG-UNIT tablet Commonly known as:  OSCAL WITH D Take 1 tablet by mouth daily with breakfast.   carvedilol 3.125 MG tablet Commonly known as:  COREG Take 1 tablet (3.125 mg total) by mouth 2 (two) times daily with a meal.   COMBIGAN 0.2-0.5 % ophthalmic solution Generic drug:  brimonidine-timolol Place 1 drop into the right eye every 12 (twelve) hours.   enoxaparin 40 MG/0.4ML injection Commonly known as:  LOVENOX Inject 0.4 mLs (40 mg total) into the skin daily. For DVT Prophylaxis   feeding supplement (ENSURE ENLIVE) Liqd Take 237 mLs by mouth 2 (two) times daily between meals.   HYDROcodone-acetaminophen 5-325 MG tablet Commonly known as:  NORCO/VICODIN Take 1 tablet by mouth every 6 (six) hours as needed for moderate pain or severe pain.   levothyroxine 50 MCG tablet Commonly known as:  SYNTHROID, LEVOTHROID Take 50 mcg by mouth daily before breakfast.   losartan-hydrochlorothiazide 50-12.5 MG tablet Commonly known as:  HYZAAR Take 1 tablet by mouth daily.   metFORMIN 500 MG tablet Commonly known as:  GLUCOPHAGE Take 1,000 mg by mouth 2 (two) times daily with a meal.   methocarbamol 500 MG tablet Commonly known as:  ROBAXIN Take 1 tablet (500 mg total) by mouth 3 (three) times daily.   multivitamin with minerals Tabs tablet Take 1 tablet by mouth every other day.   MYRBETRIQ 50 MG Tb24 tablet Generic drug:  mirabegron ER Take 50 mg by mouth daily.   NAMZARIC 28-10 MG Cp24 Generic drug:  Memantine HCl-Donepezil  HCl Take 28 mg by mouth every evening.   omeprazole 20 MG  capsule Commonly known as:  PRILOSEC Take 1 capsule (20 mg total) by mouth 2 (two) times daily before a meal.   pioglitazone 30 MG tablet Commonly known as:  ACTOS Take 30 mg by mouth daily.   senna-docusate 8.6-50 MG tablet Commonly known as:  Senokot-S Take 2 tablets by mouth 2 (two) times daily.   simvastatin 20 MG tablet Commonly known as:  ZOCOR Take 20 mg by mouth daily at 6 PM.   traZODone 50 MG tablet Commonly known as:  DESYREL Take 25 mg by mouth at bedtime.   trimethoprim 100 MG tablet Commonly known as:  TRIMPEX Take 100 mg by mouth daily.   Vitamin D3 50 MCG (2000 UT) capsule Take 2,000 Units by mouth daily.       Major procedures and Radiology Reports - PLEASE review detailed and final reports for all details, in brief -   Dg Chest 1 View  Result Date: 05/14/2018 CLINICAL DATA:  Larey Seat today.  Right hip fracture. EXAM: CHEST  1 VIEW COMPARISON:  09/03/2017 FINDINGS: The cardiac silhouette, mediastinal and hilar contours are within normal limits and stable. The lungs are clear. No pleural effusion. The bony structures are intact. Suspect remote left humeral neck fracture. IMPRESSION: No acute cardiopulmonary findings and intact bony thorax. Electronically Signed   By: Rudie Meyer M.D.   On: 05/14/2018 10:31   Dg Pelvis Portable  Result Date: 05/15/2018 CLINICAL DATA:  Postop EXAM: PORTABLE PELVIS 1-2 VIEWS COMPARISON:  05/14/2018 FINDINGS: Internal fixation across the right femoral intertrochanteric fracture. Anatomic alignment. No hardware bony complicating feature. IMPRESSION: Internal fixation. No visible complicating feature. Anatomic alignment. Electronically Signed   By: Charlett Nose M.D.   On: 05/15/2018 19:32   Dg C-arm 1-60 Min  Result Date: 05/15/2018 CLINICAL DATA:  Open reduction internal fixation of right hip fracture EXAM: DG C-ARM 61-120 MIN; OPERATIVE RIGHT HIP WITH PELVIS COMPARISON:   05/14/2018 FINDINGS: Intraoperative fluoroscopic images demonstrate interval intramedullary rod and screw fixation of right intertrochanteric femoral fracture. Normal alignment of the osseous structures and the orthopedic hardware. No new fractures are seen. IMPRESSION: Status post intramedullary rod fixation of right intertrochanteric femoral fracture without evidence of immediate complications. Electronically Signed   By: Ted Mcalpine M.D.   On: 05/15/2018 16:26   Dg Hip Operative Unilat W Or W/o Pelvis Right  Result Date: 05/15/2018 CLINICAL DATA:  Open reduction internal fixation of right hip fracture EXAM: DG C-ARM 61-120 MIN; OPERATIVE RIGHT HIP WITH PELVIS COMPARISON:  05/14/2018 FINDINGS: Intraoperative fluoroscopic images demonstrate interval intramedullary rod and screw fixation of right intertrochanteric femoral fracture. Normal alignment of the osseous structures and the orthopedic hardware. No new fractures are seen. IMPRESSION: Status post intramedullary rod fixation of right intertrochanteric femoral fracture without evidence of immediate complications. Electronically Signed   By: Ted Mcalpine M.D.   On: 05/15/2018 16:26   Dg Hip Unilat With Pelvis 2-3 Views Right  Result Date: 05/14/2018 CLINICAL DATA:  Larey Seat today.  Right hip pain. EXAM: DG HIP (WITH OR WITHOUT PELVIS) 2-3V RIGHT COMPARISON:  Radiographs 11/11/2014 FINDINGS: There is a minimally displaced intertrochanteric fracture of the right hip. No femoral neck fracture. The bony pelvis is intact. No pelvic fractures. IMPRESSION: Mildly displaced intertrochanteric fracture of the right hip. Electronically Signed   By: Rudie Meyer M.D.   On: 05/14/2018 10:32   Dg Femur Min 2 Views Right  Result Date: 05/14/2018 CLINICAL DATA:  Larey Seat today.  Right leg pain. EXAM: RIGHT FEMUR 2 VIEWS COMPARISON:  None. FINDINGS: Intertrochanteric right hip fracture is noted. No femoral shaft fracture. IMPRESSION: Intertrochanteric fracture  of the right hip. No femoral shaft fracture. Electronically Signed   By: Rudie Meyer M.D.   On: 05/14/2018 10:33    Micro Results   Recent Results (from the past 240 hour(s))  MRSA PCR Screening     Status: None   Collection Time: 05/14/18  8:25 PM  Result Value Ref Range Status   MRSA by PCR NEGATIVE NEGATIVE Final    Comment:        The GeneXpert MRSA Assay (FDA approved for NASAL specimens only), is one component of a comprehensive MRSA colonization surveillance program. It is not intended to diagnose MRSA infection nor to guide or monitor treatment for MRSA infections. Performed at [facility], [address], [place], [place] [address]        Today   Subjective    Adylee Leonardo today has no new concerns, eating and drinking well, no fevers, no chest pains          Patient has been seen and examined prior to discharge   Objective   Blood pressure (!) 132/53, pulse 71, temperature 98.5 F (36.9 C), temperature source Oral, resp. rate 18, height 5' 4.02" (1.626 m), weight 59 kg, SpO2 94 %.   Intake/Output Summary (Last 24 hours) at 05/20/2018 1256 Last data filed at 05/20/2018 1251 Gross per 24 hour  Intake 480 ml  Output 100 ml  Net 380 ml    Exam Gen:- Awake Alert,  In no apparent distress  HEENT:- [place].AT, No sclera icterus Neck-Supple Neck,No JVD,.  Lungs-  CTAB , fair symmetrical air movement CV- S1, S2 normal, regular  Abd-  +ve B.Sounds, Abd Soft, No tenderness,    Extremity/Skin:- No  edema, pedal pulses present  Psych-affect is appropriate, oriented x3 Neuro-no new focal deficits, no tremors MSK- Rt Hip  s/p ORIF wound is Clean/Intact, appropriate postop tenderness   Data Review   CBC w Diff:  Lab Results  Component Value Date   WBC 5.2 05/19/2018   HGB 9.7 (L) 05/19/2018   HCT 30.0 (L) 05/19/2018   PLT 170 05/19/2018   LYMPHOPCT 18 05/14/2018   MONOPCT 6 05/14/2018   EOSPCT 0 05/14/2018   BASOPCT 1 05/14/2018    CMP:  Lab  Results  Component Value Date   NA 136 05/19/2018   K 4.0 05/19/2018   CL 98 05/19/2018   CO2 30 05/19/2018   BUN 19 05/19/2018   CREATININE 0.61 05/19/2018   PROT 6.5 08/05/2007   ALBUMIN 3.5 08/05/2007   BILITOT 0.5 08/05/2007   ALKPHOS 50 08/05/2007   AST 37 08/05/2007   ALT 34 08/05/2007  . Total Discharge time is about 33 minutes  Shon Hale M.D on 05/20/2018 at 12:56 PM  Pager---[phone]  Go to [email or web address] - password [email or web address] for contact info  [facility] - Office  [phone]

## 2018-05-20 NOTE — Care Management Important Message (Signed)
Important Message  Patient Details  Name: Chelsea Roberson MRN: [id number] Date of Birth: 01-11-1946   Medicare Important Message Given:  Yes  IM signed/given on 05/17/2018   [name] 05/20/2018, 8:27 AM

## 2018-05-20 NOTE — Progress Notes (Signed)
AVS, printed prescriptions, and social worker's paperwork placed in discharge packet. Report called and given to Dorathy DaftKayla, RN at [facility]. All questions answered to satisfaction. [facility] to transport pt. Will continue to monitor.

## 2018-05-20 NOTE — Discharge Instructions (Signed)
1)Novolog insulin aspart (novoLOG) injection sliding scale  0-9 Units 0-9 Units, Subcutaneous, 3 times daily with meals, First dose on Wed 05/15/18 at 1200 Correction coverage: Sensitive (thin, NPO, renal) CBG < 70: implement hypoglycemia protocol CBG 70 - 120: 0 units CBG 121 - 150: 1 unit CBG 151 - 200: 2 units CBG 201 - 250: 3 units CBG 251 - 300: 5 units CBG 301 - 350: 7 units CBG 351 - 400: 9 units CBG > 400: call MD   2)You are taking Lovenox/Enoxaparin for DVT prophylaxis so Avoid ibuprofen/Advil/Aleve/Motrin/Goody Powders / Naproxen / BC powders / Meloxicam / Diclofenac/Indomethacin and other Nonsteroidal anti-inflammatory medications as these will make you more likely to bleed and can cause stomach ulcers, can also cause Kidney problems.   3)Repeat CBC in 1 week  4) follow-up with orthopedic surgeon as advised  5)Weightbearing:WBAT RLE Insicional and dressing care: Dressing clean, dry, intact. No drainage. Dressing changes as advised   6) recheck BMP within a week due to tendency to was hypokalemia in the setting of HCTZ use

## 2018-05-20 NOTE — Progress Notes (Signed)
Patient will DC to: [facility] Anticipated DC date: 05/20/18 Family notified: spouse Engineer, maintenanceobert Transport by: Sharin MonsPTAR  Per MD patient ready for DC to [facility]. RN, patient, patient's family, and facility notified of DC. Discharge Summary sent to facility. RN given number for report [phone] Room 114. DC packet on chart. Ambulance transport requested for patient.  CSW signing off.  [place] ButlerAshley Hebah Bogosian, [place]CS[place] [phone]

## 2018-06-18 ENCOUNTER — Encounter (INDEPENDENT_AMBULATORY_CARE_PROVIDER_SITE_OTHER): Payer: Medicare Other | Admitting: Ophthalmology

## 2018-06-18 DIAGNOSIS — E11311 Type 2 diabetes mellitus with unspecified diabetic retinopathy with macular edema: Secondary | ICD-10-CM | POA: Diagnosis not present

## 2018-06-18 DIAGNOSIS — E113313 Type 2 diabetes mellitus with moderate nonproliferative diabetic retinopathy with macular edema, bilateral: Secondary | ICD-10-CM

## 2018-06-18 DIAGNOSIS — H43813 Vitreous degeneration, bilateral: Secondary | ICD-10-CM

## 2018-06-18 DIAGNOSIS — H35033 Hypertensive retinopathy, bilateral: Secondary | ICD-10-CM | POA: Diagnosis not present

## 2018-06-18 DIAGNOSIS — I10 Essential (primary) hypertension: Secondary | ICD-10-CM

## 2018-07-23 ENCOUNTER — Encounter (INDEPENDENT_AMBULATORY_CARE_PROVIDER_SITE_OTHER): Payer: Medicare Other | Admitting: Ophthalmology

## 2018-07-23 DIAGNOSIS — E113313 Type 2 diabetes mellitus with moderate nonproliferative diabetic retinopathy with macular edema, bilateral: Secondary | ICD-10-CM | POA: Diagnosis not present

## 2018-07-23 DIAGNOSIS — I10 Essential (primary) hypertension: Secondary | ICD-10-CM

## 2018-07-23 DIAGNOSIS — E11311 Type 2 diabetes mellitus with unspecified diabetic retinopathy with macular edema: Secondary | ICD-10-CM | POA: Diagnosis not present

## 2018-07-23 DIAGNOSIS — H35033 Hypertensive retinopathy, bilateral: Secondary | ICD-10-CM

## 2018-07-23 DIAGNOSIS — H43813 Vitreous degeneration, bilateral: Secondary | ICD-10-CM

## 2018-08-27 ENCOUNTER — Other Ambulatory Visit: Payer: Self-pay

## 2018-08-27 ENCOUNTER — Encounter (INDEPENDENT_AMBULATORY_CARE_PROVIDER_SITE_OTHER): Payer: Medicare Other | Admitting: Ophthalmology

## 2018-08-27 DIAGNOSIS — I10 Essential (primary) hypertension: Secondary | ICD-10-CM | POA: Diagnosis not present

## 2018-08-27 DIAGNOSIS — E11311 Type 2 diabetes mellitus with unspecified diabetic retinopathy with macular edema: Secondary | ICD-10-CM | POA: Diagnosis not present

## 2018-08-27 DIAGNOSIS — E113313 Type 2 diabetes mellitus with moderate nonproliferative diabetic retinopathy with macular edema, bilateral: Secondary | ICD-10-CM | POA: Diagnosis not present

## 2018-08-27 DIAGNOSIS — H35033 Hypertensive retinopathy, bilateral: Secondary | ICD-10-CM

## 2018-08-27 DIAGNOSIS — H43813 Vitreous degeneration, bilateral: Secondary | ICD-10-CM

## 2018-10-01 ENCOUNTER — Other Ambulatory Visit: Payer: Self-pay

## 2018-10-01 ENCOUNTER — Encounter (INDEPENDENT_AMBULATORY_CARE_PROVIDER_SITE_OTHER): Payer: Medicare Other | Admitting: Ophthalmology

## 2018-10-01 DIAGNOSIS — I10 Essential (primary) hypertension: Secondary | ICD-10-CM | POA: Diagnosis not present

## 2018-10-01 DIAGNOSIS — E11311 Type 2 diabetes mellitus with unspecified diabetic retinopathy with macular edema: Secondary | ICD-10-CM

## 2018-10-01 DIAGNOSIS — H35033 Hypertensive retinopathy, bilateral: Secondary | ICD-10-CM | POA: Diagnosis not present

## 2018-10-01 DIAGNOSIS — E113313 Type 2 diabetes mellitus with moderate nonproliferative diabetic retinopathy with macular edema, bilateral: Secondary | ICD-10-CM

## 2018-10-01 DIAGNOSIS — H43813 Vitreous degeneration, bilateral: Secondary | ICD-10-CM

## 2018-10-29 ENCOUNTER — Other Ambulatory Visit: Payer: Self-pay

## 2018-10-29 ENCOUNTER — Encounter (INDEPENDENT_AMBULATORY_CARE_PROVIDER_SITE_OTHER): Payer: Medicare Other | Admitting: Ophthalmology

## 2018-10-29 DIAGNOSIS — E113313 Type 2 diabetes mellitus with moderate nonproliferative diabetic retinopathy with macular edema, bilateral: Secondary | ICD-10-CM

## 2018-10-29 DIAGNOSIS — H35033 Hypertensive retinopathy, bilateral: Secondary | ICD-10-CM | POA: Diagnosis not present

## 2018-10-29 DIAGNOSIS — E11311 Type 2 diabetes mellitus with unspecified diabetic retinopathy with macular edema: Secondary | ICD-10-CM

## 2018-10-29 DIAGNOSIS — H43813 Vitreous degeneration, bilateral: Secondary | ICD-10-CM

## 2018-10-29 DIAGNOSIS — I10 Essential (primary) hypertension: Secondary | ICD-10-CM | POA: Diagnosis not present

## 2018-11-26 ENCOUNTER — Other Ambulatory Visit: Payer: Self-pay

## 2018-11-26 ENCOUNTER — Encounter (INDEPENDENT_AMBULATORY_CARE_PROVIDER_SITE_OTHER): Payer: Medicare Other | Admitting: Ophthalmology

## 2018-11-26 DIAGNOSIS — H35033 Hypertensive retinopathy, bilateral: Secondary | ICD-10-CM | POA: Diagnosis not present

## 2018-11-26 DIAGNOSIS — H43813 Vitreous degeneration, bilateral: Secondary | ICD-10-CM

## 2018-11-26 DIAGNOSIS — I10 Essential (primary) hypertension: Secondary | ICD-10-CM | POA: Diagnosis not present

## 2018-11-26 DIAGNOSIS — E113313 Type 2 diabetes mellitus with moderate nonproliferative diabetic retinopathy with macular edema, bilateral: Secondary | ICD-10-CM

## 2018-11-26 DIAGNOSIS — E11311 Type 2 diabetes mellitus with unspecified diabetic retinopathy with macular edema: Secondary | ICD-10-CM

## 2018-12-23 ENCOUNTER — Other Ambulatory Visit: Payer: Self-pay

## 2018-12-23 ENCOUNTER — Encounter (INDEPENDENT_AMBULATORY_CARE_PROVIDER_SITE_OTHER): Payer: Medicare Other | Admitting: Ophthalmology

## 2018-12-23 DIAGNOSIS — E11311 Type 2 diabetes mellitus with unspecified diabetic retinopathy with macular edema: Secondary | ICD-10-CM

## 2018-12-23 DIAGNOSIS — I10 Essential (primary) hypertension: Secondary | ICD-10-CM

## 2018-12-23 DIAGNOSIS — E113313 Type 2 diabetes mellitus with moderate nonproliferative diabetic retinopathy with macular edema, bilateral: Secondary | ICD-10-CM

## 2018-12-23 DIAGNOSIS — H43813 Vitreous degeneration, bilateral: Secondary | ICD-10-CM

## 2018-12-23 DIAGNOSIS — H35033 Hypertensive retinopathy, bilateral: Secondary | ICD-10-CM

## 2019-01-20 ENCOUNTER — Encounter (INDEPENDENT_AMBULATORY_CARE_PROVIDER_SITE_OTHER): Payer: Medicare Other | Admitting: Ophthalmology

## 2019-01-20 ENCOUNTER — Other Ambulatory Visit: Payer: Self-pay

## 2019-01-20 DIAGNOSIS — H43813 Vitreous degeneration, bilateral: Secondary | ICD-10-CM

## 2019-01-20 DIAGNOSIS — H35033 Hypertensive retinopathy, bilateral: Secondary | ICD-10-CM | POA: Diagnosis not present

## 2019-01-20 DIAGNOSIS — E11311 Type 2 diabetes mellitus with unspecified diabetic retinopathy with macular edema: Secondary | ICD-10-CM | POA: Diagnosis not present

## 2019-01-20 DIAGNOSIS — E113313 Type 2 diabetes mellitus with moderate nonproliferative diabetic retinopathy with macular edema, bilateral: Secondary | ICD-10-CM | POA: Diagnosis not present

## 2019-01-20 DIAGNOSIS — I10 Essential (primary) hypertension: Secondary | ICD-10-CM

## 2019-02-20 ENCOUNTER — Other Ambulatory Visit: Payer: Self-pay

## 2019-02-20 ENCOUNTER — Encounter (INDEPENDENT_AMBULATORY_CARE_PROVIDER_SITE_OTHER): Payer: Medicare Other | Admitting: Ophthalmology

## 2019-02-20 DIAGNOSIS — E11311 Type 2 diabetes mellitus with unspecified diabetic retinopathy with macular edema: Secondary | ICD-10-CM | POA: Diagnosis not present

## 2019-02-20 DIAGNOSIS — I10 Essential (primary) hypertension: Secondary | ICD-10-CM

## 2019-02-20 DIAGNOSIS — H35033 Hypertensive retinopathy, bilateral: Secondary | ICD-10-CM

## 2019-02-20 DIAGNOSIS — E113313 Type 2 diabetes mellitus with moderate nonproliferative diabetic retinopathy with macular edema, bilateral: Secondary | ICD-10-CM | POA: Diagnosis not present

## 2019-02-20 DIAGNOSIS — H43813 Vitreous degeneration, bilateral: Secondary | ICD-10-CM

## 2019-03-19 ENCOUNTER — Encounter (INDEPENDENT_AMBULATORY_CARE_PROVIDER_SITE_OTHER): Payer: Medicare Other | Admitting: Ophthalmology

## 2019-03-21 ENCOUNTER — Other Ambulatory Visit: Payer: Self-pay

## 2019-03-21 ENCOUNTER — Encounter (INDEPENDENT_AMBULATORY_CARE_PROVIDER_SITE_OTHER): Payer: Medicare Other | Admitting: Ophthalmology

## 2019-03-21 DIAGNOSIS — H43813 Vitreous degeneration, bilateral: Secondary | ICD-10-CM

## 2019-03-21 DIAGNOSIS — H35033 Hypertensive retinopathy, bilateral: Secondary | ICD-10-CM

## 2019-03-21 DIAGNOSIS — E11311 Type 2 diabetes mellitus with unspecified diabetic retinopathy with macular edema: Secondary | ICD-10-CM | POA: Diagnosis not present

## 2019-03-21 DIAGNOSIS — E113313 Type 2 diabetes mellitus with moderate nonproliferative diabetic retinopathy with macular edema, bilateral: Secondary | ICD-10-CM | POA: Diagnosis not present

## 2019-03-21 DIAGNOSIS — I10 Essential (primary) hypertension: Secondary | ICD-10-CM

## 2019-04-16 ENCOUNTER — Encounter (INDEPENDENT_AMBULATORY_CARE_PROVIDER_SITE_OTHER): Payer: Medicare Other | Admitting: Ophthalmology

## 2019-04-16 ENCOUNTER — Other Ambulatory Visit: Payer: Self-pay

## 2019-04-16 DIAGNOSIS — H43813 Vitreous degeneration, bilateral: Secondary | ICD-10-CM

## 2019-04-16 DIAGNOSIS — H35033 Hypertensive retinopathy, bilateral: Secondary | ICD-10-CM

## 2019-04-16 DIAGNOSIS — I10 Essential (primary) hypertension: Secondary | ICD-10-CM | POA: Diagnosis not present

## 2019-04-16 DIAGNOSIS — E113313 Type 2 diabetes mellitus with moderate nonproliferative diabetic retinopathy with macular edema, bilateral: Secondary | ICD-10-CM | POA: Diagnosis not present

## 2019-04-16 DIAGNOSIS — E11311 Type 2 diabetes mellitus with unspecified diabetic retinopathy with macular edema: Secondary | ICD-10-CM

## 2019-04-29 ENCOUNTER — Ambulatory Visit: Payer: Self-pay | Admitting: Student

## 2019-04-29 DIAGNOSIS — S72001A Fracture of unspecified part of neck of right femur, initial encounter for closed fracture: Secondary | ICD-10-CM | POA: Insufficient documentation

## 2019-05-12 ENCOUNTER — Other Ambulatory Visit (HOSPITAL_COMMUNITY)
Admission: RE | Admit: 2019-05-12 | Discharge: 2019-05-12 | Disposition: A | Discharge status: Home / Self Care | Payer: Medicare Other | Source: Ambulatory Visit | Attending: Student | Admitting: Student

## 2019-05-12 DIAGNOSIS — Z01812 Encounter for preprocedural laboratory examination: Secondary | ICD-10-CM | POA: Diagnosis present

## 2019-05-12 DIAGNOSIS — Z20828 Contact with and (suspected) exposure to other viral communicable diseases: Secondary | ICD-10-CM | POA: Insufficient documentation

## 2019-05-12 LAB — SARS CORONAVIRUS 2 (TAT 6-24 HRS): SARS Coronavirus 2: NEGATIVE

## 2019-05-13 ENCOUNTER — Encounter (HOSPITAL_COMMUNITY): Payer: Self-pay | Admitting: General Practice

## 2019-05-13 ENCOUNTER — Other Ambulatory Visit: Payer: Self-pay

## 2019-05-13 HISTORY — PX: FRACTURE SURGERY: SHX138

## 2019-05-13 NOTE — Progress Notes (Signed)
Spoke with Joaquim Lai and Herbie Baltimore (spouse) on the phone for preop instructions.  Patient states that she has memory issues and needs Robert's assistance when answering questions.  Herbie Baltimore will need to accompany patient to short stay area on [place].    PCP - Dr. Felipa Eth Cardiologist - denies Endocrinologist - denies  Patient denies any recent lab work, CXR, EKG, etc.  Patient's spouse states that she checks her blood sugar every other day and that her fasting glucose is in the 120's.  Instructed patient and spouse to check blood sugar when patient awakes in the morning and to check it every 2 hours prior to surgery.  Instructed spouse for patient to have a 1/2 cup of clear juice (apple or cranberry) if blood sugar is less than 70 and then to recheck blood sugar after 15 minutes.  Instructed patient to hold Invokana.  Patient's spouse verbalized understanding.    Patient and spouse stated they have been quarantined after the covid testing and will remain at home until surgery tomorrow.

## 2019-05-13 NOTE — H&P (Signed)
Orthopaedic Trauma Service (OTS) H&P  Patient ID: Chelsea Roberson MRN: [id number] DOB/AGE: 73-Aug-1947 73 y.o.  Reason for Surgery: Painful hardware right hip  HPI: Chelsea Roberson is an 73 y.o. female presenting for surgery for removal of hardware from right hip. Patient underwent cephalomedullary nailing of right intertrochanteric femur fracture on 05/16/2019 after sustaining a fall. Patient has gone on to heal the fracture and progressed well for the first 10 months of her recovery. Over the past 8 weeks patient states she has been having increased pain in her thigh that is worse with ambulation. Pain has limited her mobility. Denies any new falls or injuries. She has tried OTC as well as prescription anti-inflammatory medications with no relief.    Past Medical History:  Diagnosis Date  . Anxiety   . Arthritis   . Closed right hip fracture, initial encounter (HCC) 05/14/2018  . Complication of anesthesia   . Dementia (HCC)   . DM (diabetes mellitus), type 2 (HCC)   . GERD (gastroesophageal reflux disease)   . Hyperlipidemia   . Hypertension   . Hypothyroidism   . PONV (postoperative nausea and vomiting)   . Sleep apnea    history of sleep apnea but pt states that no longer has sleep apnea after weight loss    Past Surgical History:  Procedure Laterality Date  . COLONOSCOPY WITH PROPOFOL N/A 05/09/2016   Procedure: COLONOSCOPY WITH PROPOFOL;  Surgeon: Charolett BumpersMartin K Johnson, MD;  Location: [facility];  Service: Endoscopy;  Laterality: N/A;  . EYE SURGERY Bilateral    cataracts  . FEMUR IM NAIL Right 05/15/2018   Procedure: INTRAMEDULLARY (IM) NAIL FEMORAL;  Surgeon: Roby LoftsHaddix, Kevin P, MD;  Location: [facility] OR;  Service: Orthopedics;  Laterality: Right;  . PARTIAL HYSTERECTOMY    . WRIST SURGERY Right     Family History  Problem Relation Age of Onset  . Failure to thrive Mother   . CAD Father        CAD diagnosed around 8240, passed away at 7254 of MI  . CAD Brother        Died in his  9680s of MI, had had CABG about 2 years before he died    Social History:  reports that she has never smoked. She has never used smokeless tobacco. She reports that she does not drink alcohol or use drugs.  Allergies:  Allergies  Allergen Reactions  . Ciprofloxacin Other (See Comments)    HEADACHE   . Sulfa Antibiotics Nausea Only    Medications:  Current Meds  Medication Sig  . acetaminophen (TYLENOL) 650 MG CR tablet Take 1,300-1,950 mg by mouth every 8 (eight) hours as needed for pain.   Marland Kitchen. alendronate (FOSAMAX) 70 MG tablet Take 70 mg by mouth every Sunday.   Marland Kitchen. amLODipine (NORVASC) 2.5 MG tablet Take 2.5 mg by mouth daily.  Marland Kitchen. Besifloxacin HCl (BESIVANCE) 0.6 % SUSP Place 1 drop into both eyes See admin instructions. Instill 1 drop into both eyes 4 times daily for 2 days after eye injections  . busPIRone (BUSPAR) 15 MG tablet Take 7.5-15 mg by mouth See admin instructions. 7.5mg  in the morning and 15mg  at bedtime  . Calcium Citrate-Vitamin D (CALCIUM + D PO) Take 1 tablet by mouth daily.  . canagliflozin (INVOKANA) 100 MG TABS tablet Take 100 mg by mouth daily before breakfast.  . carvedilol (COREG) 3.125 MG tablet Take 1 tablet (3.125 mg total) by mouth 2 (two) times daily with a meal.  .  Cholecalciferol (VITAMIN D3) 50 MCG (2000 UT) capsule Take 2,000 Units by mouth daily.  Marland Kitchen docusate sodium (COLACE) 100 MG capsule Take 100 mg by mouth daily.  . famotidine (PEPCID) 20 MG tablet Take 20 mg by mouth 2 (two) times daily.  . Garlic 1000 MG CAPS Take 1,000 mg by mouth daily.  Marland Kitchen ketorolac (ACULAR) 0.5 % ophthalmic solution Place 1 drop into both eyes See admin instructions. Instill 1 drop into both eyes 4 times daily for 7 days after eye injections  . levothyroxine (SYNTHROID, LEVOTHROID) 50 MCG tablet Take 50 mcg by mouth daily before breakfast.  . losartan-hydrochlorothiazide (HYZAAR) 50-12.5 MG per tablet Take 1 tablet by mouth daily.  . Multiple Vitamin (MULTIVITAMIN WITH MINERALS)  TABS Take 1 tablet by mouth daily.   Marland Kitchen NAMZARIC 28-10 MG CP24 Take 28 mg by mouth at bedtime.   . simvastatin (ZOCOR) 20 MG tablet Take 20 mg by mouth at bedtime.   . tolterodine (DETROL LA) 4 MG 24 hr capsule Take 4 mg by mouth at bedtime.  . traZODone (DESYREL) 50 MG tablet Take 25 mg by mouth at bedtime.   Marland Kitchen trimethoprim (TRIMPEX) 100 MG tablet Take 100 mg by mouth at bedtime.     ROS: Constitutional: No fever or chills Vision: No changes in vision ENT: No difficulty swallowing CV: No chest pain Pulm: No SOB or wheezing GI: No nausea or vomiting GU: No urgency or inability to hold urine Skin: No poor wound healing Neurologic: No numbness or tingling Psychiatric: No depression or anxiety Heme: No bruising Allergic: No reaction to medications or food   Exam: There were no vitals taken for this visit. General: Well-appearing, NAD Orientation: Alert and oriented x3 Mood and Affect: Mood and affect appropriate, pleasant and cooperative Gait: Patient ambulates with a slow, mildly antalgic gait with the use of a cane Coordination and balance: Within normal limits  Right lower extremity: Well-healed incisions over the lateral hip and thigh. Skin without any other lesions. Tender with palpation of the lateral mid-thigh. No tenderness with palpation over the greater trochanter or in the knee or lower leg. Has full knee range of motion without discomfort. Hip flexion without pain. Motor and sensory function is intact. Otherwise neurovascularly intact.  Left lower extremity: Skin without lesions. No tenderness to palpation. Full painless ROM, full strength in each muscle groups without evidence of instability.   Medical Decision Making: Data: Imaging: AP pelvis with AP and lateral views of the right femur show cephalomedullary device in excellent position. No signs of hardware failure or loosening. The fracture is fully healed. There is an area directly below is the end of the nail which  shows a stress reaction of the bone. No new fractures.  Labs:  Results for orders placed or performed during the hospital encounter of 05/12/19 (from the past 336 hour(s))  SARS CORONAVIRUS 2 (TAT 6-24 HRS) Nasopharyngeal Nasopharyngeal Swab   Collection Time: 05/12/19  8:43 AM   Specimen: Nasopharyngeal Swab  Result Value Ref Range   SARS Coronavirus 2 NEGATIVE NEGATIVE      Assessment/Plan: 73 year old female status post cephalomedullary nailing of right intertrochanteric femur fracture 05/15/2018 who presents today for removal of painful hardware. The pain the patient is having is likely due to a stress reaction that has been caused by the mismatch between the rigidity of the cephalomedullary nail and the patient's osteoporotic bone. We will plan to remove just the distal screw from the cephalomedullary nail to allow for more motion  between the nail and the patient's bone. Patient will be discharged home from the PACU postoperatively. We will plan to follow-up with her on an outpatient basis in approximately 2 weeks. If she does not get any pain relief from the removal of this one distal screw we may discuss removal of the entire nail and/or exchange nailing to be done at a later time. The patient and her husband are in agreement with this plan. All questions were answered to their satisfaction. Consent was obtained.    Zaryia Markel A. Carmie Kanner [facility] [phone] (office) [email or web address]

## 2019-05-13 NOTE — Anesthesia Preprocedure Evaluation (Addendum)
Anesthesia Evaluation  Patient identified by MRN, date of birth, ID band Patient awake    Reviewed: Allergy & Precautions, NPO status , Patient's Chart, lab work & pertinent test results  History of Anesthesia Complications (+) PONV and history of anesthetic complications  Airway Mallampati: II  TM Distance: >3 FB Neck ROM: Full    Dental  (+) Dental Advisory Given, Teeth Intact, Poor Dentition   Pulmonary sleep apnea ,  OSA resolved with weight loss   Pulmonary exam normal breath sounds clear to auscultation       Cardiovascular hypertension, Pt. on medications and Pt. on home beta blockers (-) angina Rhythm:Regular Rate:Normal  Last echo 2016:  - Left ventricle: The cavity size was normal. Wall thickness was increased in a pattern of mild LVH. Systolic function was vigorous. The estimated ejection fraction was in the range of 65% to 70%. Wall motion was normal; there were no regional wall motion abnormalities. Doppler parameters are consistent with abnormal left ventricular relaxation (grade 1 diastolic dysfunction). - Aortic valve: There was mild regurgitation.   Neuro/Psych PSYCHIATRIC DISORDERS Anxiety Dementia negative neurological ROS     GI/Hepatic Neg liver ROS, GERD  Medicated and Controlled,  Endo/Other  diabetes, Type 2, Oral Hypoglycemic AgentsHypothyroidism   Renal/GU negative Renal ROS  negative genitourinary   Musculoskeletal  (+) Arthritis , Osteoarthritis,    Abdominal Normal abdominal exam  (+)   Peds  Hematology negative hematology ROS (+)   Anesthesia Other Findings [facility] last year, c/o painful hardware after IM nailing right hip  HLD  Reproductive/Obstetrics negative OB ROS                           Anesthesia Physical  Anesthesia Plan  ASA: III  Anesthesia Plan: Spinal   Post-op Pain Management:    Induction: Intravenous  PONV Risk Score and Plan: 3 and  Treatment may vary due to age or medical condition, TIVA and Propofol infusion  Airway Management Planned: Natural Airway and Simple Face Mask  Additional Equipment: None  Intra-op Plan:   Post-operative Plan:   Informed Consent: I have reviewed the patients History and Physical, chart, labs and discussed the procedure including the risks, benefits and alternatives for the proposed anesthesia with the patient or authorized representative who has indicated his/her understanding and acceptance.       Plan Discussed with: CRNA  Anesthesia Plan Comments:        Anesthesia Quick Evaluation

## 2019-05-14 ENCOUNTER — Encounter (HOSPITAL_COMMUNITY)
Admission: RE | Disposition: A | Discharge status: Home / Self Care | Payer: Self-pay | Source: Home / Self Care | Attending: Student

## 2019-05-14 ENCOUNTER — Ambulatory Visit (HOSPITAL_COMMUNITY): Payer: Medicare Other

## 2019-05-14 ENCOUNTER — Encounter (INDEPENDENT_AMBULATORY_CARE_PROVIDER_SITE_OTHER): Payer: Medicare Other | Admitting: Ophthalmology

## 2019-05-14 ENCOUNTER — Observation Stay (HOSPITAL_COMMUNITY)
Admission: RE | Admit: 2019-05-14 | Discharge: 2019-05-14 | Disposition: A | Discharge status: Home / Self Care | Payer: Medicare Other | Attending: Student | Admitting: Student

## 2019-05-14 ENCOUNTER — Encounter (HOSPITAL_COMMUNITY): Payer: Self-pay | Admitting: *Deleted

## 2019-05-14 ENCOUNTER — Ambulatory Visit (HOSPITAL_COMMUNITY): Payer: Medicare Other | Admitting: Anesthesiology

## 2019-05-14 DIAGNOSIS — Z419 Encounter for procedure for purposes other than remedying health state, unspecified: Secondary | ICD-10-CM

## 2019-05-14 DIAGNOSIS — E119 Type 2 diabetes mellitus without complications: Secondary | ICD-10-CM | POA: Insufficient documentation

## 2019-05-14 DIAGNOSIS — F419 Anxiety disorder, unspecified: Secondary | ICD-10-CM | POA: Diagnosis not present

## 2019-05-14 DIAGNOSIS — Z881 Allergy status to other antibiotic agents status: Secondary | ICD-10-CM | POA: Diagnosis not present

## 2019-05-14 DIAGNOSIS — T8484XA Pain due to internal orthopedic prosthetic devices, implants and grafts, initial encounter: Principal | ICD-10-CM | POA: Insufficient documentation

## 2019-05-14 DIAGNOSIS — F0390 Unspecified dementia without behavioral disturbance: Secondary | ICD-10-CM | POA: Diagnosis not present

## 2019-05-14 DIAGNOSIS — G4730 Sleep apnea, unspecified: Secondary | ICD-10-CM | POA: Diagnosis not present

## 2019-05-14 DIAGNOSIS — Z90710 Acquired absence of both cervix and uterus: Secondary | ICD-10-CM | POA: Diagnosis not present

## 2019-05-14 DIAGNOSIS — I10 Essential (primary) hypertension: Secondary | ICD-10-CM | POA: Insufficient documentation

## 2019-05-14 DIAGNOSIS — Z882 Allergy status to sulfonamides status: Secondary | ICD-10-CM | POA: Diagnosis not present

## 2019-05-14 DIAGNOSIS — Z791 Long term (current) use of non-steroidal anti-inflammatories (NSAID): Secondary | ICD-10-CM | POA: Insufficient documentation

## 2019-05-14 DIAGNOSIS — E785 Hyperlipidemia, unspecified: Secondary | ICD-10-CM | POA: Diagnosis not present

## 2019-05-14 DIAGNOSIS — Z7984 Long term (current) use of oral hypoglycemic drugs: Secondary | ICD-10-CM | POA: Insufficient documentation

## 2019-05-14 DIAGNOSIS — E039 Hypothyroidism, unspecified: Secondary | ICD-10-CM | POA: Diagnosis not present

## 2019-05-14 DIAGNOSIS — Z79899 Other long term (current) drug therapy: Secondary | ICD-10-CM | POA: Diagnosis not present

## 2019-05-14 DIAGNOSIS — Y831 Surgical operation with implant of artificial internal device as the cause of abnormal reaction of the patient, or of later complication, without mention of misadventure at the time of the procedure: Secondary | ICD-10-CM | POA: Diagnosis not present

## 2019-05-14 DIAGNOSIS — M1990 Unspecified osteoarthritis, unspecified site: Secondary | ICD-10-CM | POA: Diagnosis not present

## 2019-05-14 DIAGNOSIS — K219 Gastro-esophageal reflux disease without esophagitis: Secondary | ICD-10-CM | POA: Diagnosis not present

## 2019-05-14 DIAGNOSIS — Z8249 Family history of ischemic heart disease and other diseases of the circulatory system: Secondary | ICD-10-CM | POA: Insufficient documentation

## 2019-05-14 DIAGNOSIS — S72001A Fracture of unspecified part of neck of right femur, initial encounter for closed fracture: Secondary | ICD-10-CM | POA: Insufficient documentation

## 2019-05-14 DIAGNOSIS — S7290XA Unspecified fracture of unspecified femur, initial encounter for closed fracture: Secondary | ICD-10-CM

## 2019-05-14 DIAGNOSIS — Z472 Encounter for removal of internal fixation device: Secondary | ICD-10-CM | POA: Diagnosis present

## 2019-05-14 HISTORY — DX: Other amnesia: R41.3

## 2019-05-14 HISTORY — PX: HARDWARE REMOVAL: SHX979

## 2019-05-14 LAB — BASIC METABOLIC PANEL
Anion gap: 13 (ref 5–15)
BUN: 28 mg/dL — ABNORMAL HIGH (ref 8–23)
CO2: 22 mmol/L (ref 22–32)
Calcium: 9.6 mg/dL (ref 8.9–10.3)
Chloride: 103 mmol/L (ref 98–111)
Creatinine, Ser: 0.79 mg/dL (ref 0.44–1.00)
GFR calc Af Amer: >60 mL/min (ref >60)
GFR calc non Af Amer: >60 mL/min (ref >60)
Glucose, Bld: 134 mg/dL — ABNORMAL HIGH (ref 70–99)
Potassium: 5 mmol/L (ref 3.5–5.1)
Sodium: 138 mmol/L (ref 135–145)

## 2019-05-14 LAB — HEMOGLOBIN A1C
Hgb A1c MFr Bld: 6.7 % — ABNORMAL HIGH (ref 4.8–5.6)
Mean Plasma Glucose: 145.59 mg/dL

## 2019-05-14 LAB — GLUCOSE, CAPILLARY
Glucose-Capillary: 140 mg/dL — ABNORMAL HIGH (ref 70–99)
Glucose-Capillary: 125 mg/dL — ABNORMAL HIGH (ref 70–99)

## 2019-05-14 LAB — CBC
HCT: 46.4 % — ABNORMAL HIGH (ref 36.0–46.0)
Hemoglobin: 15 g/dL (ref 12.0–15.0)
MCH: 33 pg (ref 26.0–34.0)
MCHC: 32.3 g/dL (ref 30.0–36.0)
MCV: 102.2 fL — ABNORMAL HIGH (ref 80.0–100.0)
Platelets: 189 10*3/uL (ref 150–400)
RBC: 4.54 MIL/uL (ref 3.87–5.11)
RDW: 14.2 % (ref 11.5–15.5)
WBC: 5.7 10*3/uL (ref 4.0–10.5)
nRBC: 0 % (ref 0.0–0.2)

## 2019-05-14 SURGERY — REMOVAL, HARDWARE
Anesthesia: Spinal | Site: Leg Upper | Laterality: Right

## 2019-05-14 MED ORDER — ACETAMINOPHEN 500 MG PO TABS
1000.0000 mg | ORAL_TABLET | Freq: Once | ORAL | Status: DC
  Filled 2019-05-14: qty 2

## 2019-05-14 MED ORDER — PHENYLEPHRINE HCL-NACL 10-0.9 MG/250ML-% IV SOLN
INTRAVENOUS | Status: DC | PRN
  Administered 2019-05-14: 25 ug/min via INTRAVENOUS

## 2019-05-14 MED ORDER — PROPOFOL 500 MG/50ML IV EMUL
INTRAVENOUS | Status: DC | PRN
  Administered 2019-05-14: 75 ug/kg/min via INTRAVENOUS

## 2019-05-14 MED ORDER — FENTANYL CITRATE (PF) 100 MCG/2ML IJ SOLN: 25.0000 ug | INTRAMUSCULAR | Status: DC | PRN

## 2019-05-14 MED ORDER — ONDANSETRON HCL 4 MG/2ML IJ SOLN
INTRAMUSCULAR | Status: DC | PRN
  Administered 2019-05-14: 4 mg via INTRAVENOUS

## 2019-05-14 MED ORDER — CHLORHEXIDINE GLUCONATE 4 % EX LIQD: 60.0000 mL | Freq: Once | CUTANEOUS | Status: DC

## 2019-05-14 MED ORDER — FENTANYL CITRATE (PF) 250 MCG/5ML IJ SOLN
INTRAMUSCULAR | Status: DC | PRN
  Administered 2019-05-14: 25 ug via INTRAVENOUS

## 2019-05-14 MED ORDER — TRAMADOL HCL 50 MG PO TABS: 50.0000 mg | ORAL_TABLET | Freq: Four times a day (QID) | ORAL | 0 refills | Status: DC | PRN

## 2019-05-14 MED ORDER — ONDANSETRON HCL 4 MG/2ML IJ SOLN: 4.0000 mg | Freq: Once | INTRAMUSCULAR | Status: DC | PRN

## 2019-05-14 MED ORDER — LIDOCAINE 2% (20 MG/ML) 5 ML SYRINGE
INTRAMUSCULAR | Status: DC | PRN
  Administered 2019-05-14: 40 mg via INTRAVENOUS

## 2019-05-14 MED ORDER — ACETAMINOPHEN 325 MG PO TABS: 650.0000 mg | ORAL_TABLET | Freq: Four times a day (QID) | ORAL | Status: DC

## 2019-05-14 MED ORDER — LACTATED RINGERS IV SOLN
INTRAVENOUS | Status: DC | PRN
  Administered 2019-05-14: 08:00:00 via INTRAVENOUS

## 2019-05-14 MED ORDER — TRAMADOL HCL 50 MG PO TABS: 50.0000 mg | ORAL_TABLET | Freq: Four times a day (QID) | ORAL | Status: DC | PRN

## 2019-05-14 MED ORDER — 0.9 % SODIUM CHLORIDE (POUR BTL) OPTIME
TOPICAL | Status: DC | PRN
  Administered 2019-05-14: 1000 mL

## 2019-05-14 MED ORDER — CEFAZOLIN SODIUM-DEXTROSE 2-4 GM/100ML-% IV SOLN
2.0000 g | INTRAVENOUS | Status: AC
  Administered 2019-05-14: 2 g via INTRAVENOUS
  Filled 2019-05-14: qty 100

## 2019-05-14 MED ORDER — FENTANYL CITRATE (PF) 250 MCG/5ML IJ SOLN
INTRAMUSCULAR | Status: AC
  Filled 2019-05-14: qty 5

## 2019-05-14 MED ORDER — PROPOFOL 10 MG/ML IV BOLUS
INTRAVENOUS | Status: AC
  Filled 2019-05-14: qty 40

## 2019-05-14 SURGICAL SUPPLY — 67 items
ADH SKN CLS APL DERMABOND .7 (GAUZE/BANDAGES/DRESSINGS) ×1
APL PRP STRL LF DISP 70% ISPRP (MISCELLANEOUS) ×2
BANDAGE ESMARK 6X9 LF (GAUZE/BANDAGES/DRESSINGS) ×1 IMPLANT
BNDG CMPR 9X6 STRL LF SNTH (GAUZE/BANDAGES/DRESSINGS)
BNDG COHESIVE 6X5 TAN STRL LF (GAUZE/BANDAGES/DRESSINGS) ×3 IMPLANT
BNDG ELASTIC 4X5.8 VLCR STR LF (GAUZE/BANDAGES/DRESSINGS) ×3 IMPLANT
BNDG ELASTIC 6X5.8 VLCR STR LF (GAUZE/BANDAGES/DRESSINGS) ×3 IMPLANT
BNDG ESMARK 6X9 LF (GAUZE/BANDAGES/DRESSINGS)
BNDG GAUZE ELAST 4 BULKY (GAUZE/BANDAGES/DRESSINGS) ×2 IMPLANT
BRUSH SCRUB EZ PLAIN DRY (MISCELLANEOUS) ×6 IMPLANT
CHLORAPREP W/TINT 26 (MISCELLANEOUS) ×5 IMPLANT
CLOSURE WOUND 1/2 X4 (GAUZE/BANDAGES/DRESSINGS)
COVER SURGICAL LIGHT HANDLE (MISCELLANEOUS) ×6 IMPLANT
COVER WAND RF STERILE (DRAPES) ×3 IMPLANT
CUFF TOURN SGL QUICK 18X4 (TOURNIQUET CUFF) IMPLANT
CUFF TOURN SGL QUICK 24 (TOURNIQUET CUFF)
CUFF TOURN SGL QUICK 34 (TOURNIQUET CUFF)
CUFF TRNQT CYL 24X4X16.5-23 (TOURNIQUET CUFF) IMPLANT
CUFF TRNQT CYL 34X4.125X (TOURNIQUET CUFF) IMPLANT
DERMABOND ADVANCED (GAUZE/BANDAGES/DRESSINGS) ×2
DERMABOND ADVANCED .7 DNX12 (GAUZE/BANDAGES/DRESSINGS) IMPLANT
DRAPE C-ARM 42X72 X-RAY (DRAPES) IMPLANT
DRAPE C-ARMOR (DRAPES) ×3 IMPLANT
DRAPE U-SHAPE 47X51 STRL (DRAPES) ×3 IMPLANT
DRSG ADAPTIC 3X8 NADH LF (GAUZE/BANDAGES/DRESSINGS) ×3 IMPLANT
ELECT REM PT RETURN 9FT ADLT (ELECTROSURGICAL) ×3
ELECTRODE REM PT RTRN 9FT ADLT (ELECTROSURGICAL) ×1 IMPLANT
GAUZE SPONGE 4X4 12PLY STRL (GAUZE/BANDAGES/DRESSINGS) ×3 IMPLANT
GLOVE BIO SURGEON STRL SZ 6.5 (GLOVE) ×6 IMPLANT
GLOVE BIO SURGEON STRL SZ7.5 (GLOVE) ×12 IMPLANT
GLOVE BIO SURGEONS STRL SZ 6.5 (GLOVE) ×3
GLOVE BIOGEL PI IND STRL 6.5 (GLOVE) ×1 IMPLANT
GLOVE BIOGEL PI IND STRL 7.5 (GLOVE) ×1 IMPLANT
GLOVE BIOGEL PI INDICATOR 6.5 (GLOVE) ×2
GLOVE BIOGEL PI INDICATOR 7.5 (GLOVE) ×2
GOWN STRL REUS W/ TWL LRG LVL3 (GOWN DISPOSABLE) ×2 IMPLANT
GOWN STRL REUS W/TWL LRG LVL3 (GOWN DISPOSABLE) ×6
KIT BASIN OR (CUSTOM PROCEDURE TRAY) ×3 IMPLANT
KIT TURNOVER KIT B (KITS) ×3 IMPLANT
MANIFOLD NEPTUNE II (INSTRUMENTS) ×3 IMPLANT
NEEDLE 22X1 1/2 (OR ONLY) (NEEDLE) IMPLANT
NS IRRIG 1000ML POUR BTL (IV SOLUTION) ×3 IMPLANT
PACK ORTHO EXTREMITY (CUSTOM PROCEDURE TRAY) ×3 IMPLANT
PAD ARMBOARD 7.5X6 YLW CONV (MISCELLANEOUS) ×6 IMPLANT
PADDING CAST COTTON 6X4 STRL (CAST SUPPLIES) ×9 IMPLANT
SPONGE LAP 18X18 RF (DISPOSABLE) ×1 IMPLANT
STAPLER VISISTAT 35W (STAPLE) IMPLANT
STOCKINETTE IMPERVIOUS LG (DRAPES) ×3 IMPLANT
STRIP CLOSURE SKIN 1/2X4 (GAUZE/BANDAGES/DRESSINGS) IMPLANT
SUCTION FRAZIER HANDLE 10FR (MISCELLANEOUS)
SUCTION TUBE FRAZIER 10FR DISP (MISCELLANEOUS) IMPLANT
SUT ETHILON 3 0 PS 1 (SUTURE) IMPLANT
SUT MNCRL AB 3-0 PS2 18 (SUTURE) ×5 IMPLANT
SUT MON AB 2-0 CT1 36 (SUTURE) ×3 IMPLANT
SUT PDS AB 2-0 CT1 27 (SUTURE) IMPLANT
SUT VIC AB 0 CT1 27 (SUTURE)
SUT VIC AB 0 CT1 27XBRD ANBCTR (SUTURE) IMPLANT
SUT VIC AB 2-0 CT1 27 (SUTURE)
SUT VIC AB 2-0 CT1 TAPERPNT 27 (SUTURE) IMPLANT
SYR CONTROL 10ML LL (SYRINGE) IMPLANT
TOWEL GREEN STERILE (TOWEL DISPOSABLE) ×6 IMPLANT
TOWEL GREEN STERILE FF (TOWEL DISPOSABLE) ×6 IMPLANT
TUBE CONNECTING 12'X1/4 (SUCTIONS) ×1
TUBE CONNECTING 12X1/4 (SUCTIONS) ×2 IMPLANT
UNDERPAD 30X30 (UNDERPADS AND DIAPERS) ×3 IMPLANT
WATER STERILE IRR 1000ML POUR (IV SOLUTION) ×6 IMPLANT
YANKAUER SUCT BULB TIP NO VENT (SUCTIONS) ×3 IMPLANT

## 2019-05-14 NOTE — Op Note (Signed)
Orthopaedic Surgery Operative Note (CSN: [id number] ) Date of Surgery: 05/14/2019  Admit Date: 05/14/2019   Diagnoses: Pre-Op Diagnoses: Right femur stress reaction Right healed intertrochanteric femur fracture  Post-Op Diagnosis: Same  Procedures: CPT 20680-Removal of hardware right femur  Surgeons : Primary: Shona Needles, MD  Assistant: None  Location: OR 7   Anesthesia:Spinal w/ MAC  Antibiotics: Ancef 2g preop   Tourniquet time:None  Estimated Blood Loss:Minimal  Complications:None  Specimens:None  Implants: Implant Name Type Inv. Item Serial No. Manufacturer Lot No. LRB No. Used Action  [place] STAR [id number] - [id number] Screw SCREW LOCK STAR [id number]  [employer]  Right 1 Explanted     Indications for Surgery: 73 year old female who had a right intertrochanteric femur fracture that I repaired in December 2019.  She was subsequently doing well until she developed a right thigh pain.  She had gone on to heal her intertrochanteric femur fracture but developed a stress reaction at the junction of her several medullary device in the isthmus of her femur.  She had increase remodeling of bone in that area due to the modulus of elasticity mismatch.  I discussed extensively with her and her husband regarding the findings I felt that removal of hardware would be most appropriate.  I presented the options of just removing the distal interlock to decrease the rigidity of the construct to see if this improves her symptoms.  I also discussed with them the possibility of removal of hardware with bone grafting of the defect left by the nail.  I also discussed with him the removal of hardware and exchange nailing with a long nail to bypass the stress reaction of the femur.  After all the discussion the patient and her husband wished to proceed with a removal of the distal interlock as this would be the least invasive and allow her to be an outpatient surgery.  Risks and benefits were  discussed with the patient and her husband including risk of continued pain, refracture, need for further surgery including the above procedures and the possibility anesthetic complications.  They agreed to proceed with surgery and consent was obtained.  Operative Findings: Removal of distal interlocking screw from several medullary device without complication.  Procedure: The patient was identified in the preoperative holding area. Consent was confirmed with the patient and their family and all questions were answered. The operative extremity was marked after confirmation with the patient. she was then brought back to the operating room by our anesthesia colleagues.  She was transferred over to radiolucent flat top table.  She was placed under spinal anesthetic. The operative extremity was then prepped and draped in usual sterile fashion. A preoperative timeout was performed to verify the patient, the procedure, and the extremity. Preoperative antibiotics were dosed.  I used fluoroscopy to identify the incision for the removal.  I made a small percutaneous incision through the previous scar.  I used a tonsil to find the screw head.  I then placed the screwdriver and removed it without difficulty.  I then obtained final fluoroscopic imaging showing the screw removed without any complication.  The incision was irrigated.  It was closed with 3-0 Monocryl and sealed with Dermabond.  A dressing was placed.  The patient was then awoken from anesthesia and taken to PACU in stable condition.  Post Op Plan/Instructions: The patient will be weightbearing as tolerated to the right lower extremity.  She will return to see me in 2 weeks.  No DVT prophylaxis is  needed for this ambulatory patient.  I was present and performed the entire surgery.  Katha Hamming, MD [facility]

## 2019-05-14 NOTE — Evaluation (Signed)
Physical Therapy Evaluation Patient Details Name: Chelsea Roberson MRN: [id number] DOB: January 14, 1946 Today's Date: 05/14/2019   History of Present Illness  Pt is a 73 y/o female s/p hardware removal R femur. PMH including but not limited to dementia, DM and HTN.  Clinical Impression  Pt presented sitting upright in recliner chair, awake and willing to participate in therapy session. Prior to admission, pt reported that she ambulated with use of a cane and was independent with ADLs. Pt lives in a single level home with several steps to enter. She stated that her husband "Nadine Counts" is always there to help her. At the time of evaluation, pt at a min guard level overall with use of RW to ambulate around PACU. Pt with no LOB or need for physical assistance. Plan is for pt to d/c home today with family support. No further acute or f/u PT services indicated at this time. PT signing off.     Follow Up Recommendations No PT follow up    Equipment Recommendations  None recommended by PT    Recommendations for Other Services       Precautions / Restrictions Precautions Precautions: Fall Restrictions Weight Bearing Restrictions: Yes RLE Weight Bearing: Weight bearing as tolerated Other Position/Activity Restrictions: as per Dr. Luvenia Starch op note      Mobility  Bed Mobility               General bed mobility comments: pt OOB in recliner chair upon arrival  Transfers Overall transfer level: Needs assistance Equipment used: None Transfers: Sit to/from Stand Sit to Stand: Min guard         General transfer comment: min guard for safety; pt a bit impulsive  Ambulation/Gait Ambulation/Gait assistance: Min guard Gait Distance (Feet): 75 Feet Assistive device: Rolling walker (2 wheeled) Gait Pattern/deviations: Step-through pattern;Decreased step length - right;Decreased step length - left;Decreased stride length Gait velocity: decreased   General Gait Details: pt with mild instability  but no overt LOB or need for physical assistance, occasional cueing needed to maintain proximity to [employer] Mobility    Modified Rankin (Stroke Patients Only)       Balance Overall balance assessment: Needs assistance Sitting-balance support: Feet supported Sitting balance-Leahy Scale: Good     Standing balance support: Single extremity supported;Bilateral upper extremity supported Standing balance-Leahy Scale: Poor                               Pertinent Vitals/Pain Pain Assessment: Faces Faces Pain Scale: No hurt    Home Living Family/patient expects to be discharged to:: Private residence Living Arrangements: Spouse/significant other Available Help at Discharge: Family Type of Home: House Home Access: Stairs to enter   Secretary/administrator of Steps: 4 Home Layout: One level Home Equipment: Environmental consultant - 2 wheels;Cane - single point      Prior Function Level of Independence: Independent with assistive device(s)         Comments: ambulates with use of a cane     Hand Dominance        Extremity/Trunk Assessment   Upper Extremity Assessment Upper Extremity Assessment: Overall WFL for tasks assessed    Lower Extremity Assessment Lower Extremity Assessment: Generalized weakness    Cervical / Trunk Assessment Cervical / Trunk Assessment: Kyphotic  Communication   Communication: No difficulties  Cognition Arousal/Alertness: Awake/alert Behavior During Therapy: Restless;Impulsive Overall  Cognitive Status: Impaired/Different from baseline Area of Impairment: Attention;Memory;Following commands;Safety/judgement;Problem solving                   Current Attention Level: Sustained Memory: Decreased short-term memory;Decreased recall of precautions Following Commands: Follows one step commands with increased time Safety/Judgement: Decreased awareness of safety;Decreased awareness of deficits   Problem  Solving: Difficulty sequencing;Requires verbal cues        General Comments      Exercises     Assessment/Plan    PT Assessment Patent does not need any further PT services  PT Problem List         PT Treatment Interventions      PT Goals (Current goals can be found in the Care Plan section)  Acute Rehab PT Goals Patient Stated Goal: "home today" PT Goal Formulation: All assessment and education complete, DC therapy    Frequency     Barriers to discharge        Co-evaluation               AM-PAC PT "6 Clicks" Mobility  Outcome Measure Help needed turning from your back to your side while in a flat bed without using bedrails?: None Help needed moving from lying on your back to sitting on the side of a flat bed without using bedrails?: None Help needed moving to and from a bed to a chair (including a wheelchair)?: None Help needed standing up from a chair using your arms (e.g., wheelchair or bedside chair)?: None Help needed to walk in hospital room?: None Help needed climbing 3-5 steps with a railing? : A Little 6 Click Score: 23    End of Session Equipment Utilized During Treatment: Gait belt Activity Tolerance: Patient tolerated treatment well Patient left: in chair;with call bell/phone within reach Nurse Communication: Mobility status PT Visit Diagnosis: Other abnormalities of gait and mobility (R26.89)    Time: 1410-1430 PT Time Calculation (min) (ACUTE ONLY): 20 min   Charges:   PT Evaluation $PT Eval Moderate Complexity: 1 Mod          Eduard Clos, PT, DPT  Acute Rehabilitation Services Pager [phone] Office [place] 05/14/2019, 4:38 PM

## 2019-05-14 NOTE — Anesthesia Procedure Notes (Signed)
Procedure Name: MAC Date/Time: 05/14/2019 8:30 AM Performed by: Alain Marion, CRNA Pre-anesthesia Checklist: Patient identified, Emergency Drugs available, Suction available, Patient being monitored and Timeout performed Oxygen Delivery Method: Simple face mask Placement Confirmation: positive ETCO2

## 2019-05-14 NOTE — Discharge Instructions (Signed)
[facility] Discharge Instructions   General Discharge Instructions  WEIGHT BEARING STATUS: Weightbearing as tolerated right lower extremity  RANGE OF MOTION/ACTIVITY: Unrestricted range of motion of hip and knees  Wound Care: You may remove your surgical dressing on Friday 05/16/19. Okay to leave incisions open to air. Okay to shower if no drainage from incisions.  DVT/PE prophylaxis: None  Diet: as you were eating previously.  Can use over the counter stool softeners and bowel preparations, such as Miralax, to help with bowel movements.  Narcotics can be constipating.  Be sure to drink plenty of fluids  PAIN MEDICATION USE AND EXPECTATIONS  You have likely been given narcotic medications to help control your pain.  After a traumatic event that results in an fracture (broken bone) with or without surgery, it is ok to use narcotic pain medications to help control one's pain.  We understand that everyone responds to pain differently and each individual patient will be evaluated on a regular basis for the continued need for narcotic medications. Ideally, narcotic medication use should last no more than 6-8 weeks (coinciding with fracture healing).   As a patient it is your responsibility as well to monitor narcotic medication use and report the amount and frequency you use these medications when you come to your office visit.   We would also advise that if you are using narcotic medications, you should take a dose prior to therapy to maximize you participation.  IF YOU ARE ON NARCOTIC MEDICATIONS IT IS NOT PERMISSIBLE TO OPERATE A MOTOR VEHICLE (MOTORCYCLE/CAR/TRUCK/MOPED) OR HEAVY MACHINERY DO NOT MIX NARCOTICS WITH OTHER CNS (CENTRAL NERVOUS SYSTEM) DEPRESSANTS SUCH AS ALCOHOL   STOP SMOKING OR USING NICOTINE PRODUCTS!!!!  As discussed nicotine severely impairs your body's ability to heal surgical and traumatic wounds but also impairs bone healing.  Wounds and bone heal by  forming microscopic blood vessels (angiogenesis) and nicotine is a vasoconstrictor (essentially, shrinks blood vessels).  Therefore, if vasoconstriction occurs to these microscopic blood vessels they essentially disappear and are unable to deliver necessary nutrients to the healing tissue.  This is one modifiable factor that you can do to dramatically increase your chances of healing your injury.    (This means no smoking, no nicotine gum, patches, etc)  DO NOT USE NONSTEROIDAL ANTI-INFLAMMATORY DRUGS (NSAID'S)  Using products such as Advil (ibuprofen), Aleve (naproxen), Motrin (ibuprofen) for additional pain control during fracture healing can delay and/or prevent the healing response.  If you would like to take over the counter (OTC) medication, Tylenol (acetaminophen) is ok.  However, some narcotic medications that are given for pain control contain acetaminophen as well. Therefore, you should not exceed more than 4000 mg of tylenol in a day if you do not have liver disease.  Also note that there are may OTC medicines, such as cold medicines and allergy medicines that my contain tylenol as well.  If you have any questions about medications and/or interactions please ask your doctor/PA or your pharmacist.      ICE AND ELEVATE INJURED/OPERATIVE EXTREMITY  Using ice and elevating the injured extremity above your heart can help with swelling and pain control.  Icing in a pulsatile fashion, such as 20 minutes on and 20 minutes off, can be followed.    Do not place ice directly on skin. Make sure there is a barrier between to skin and the ice pack.    Using frozen items such as frozen peas works well as the conform nicely to the  are that needs to be iced.  USE AN ACE WRAP OR TED HOSE FOR SWELLING CONTROL  In addition to icing and elevation, Ace wraps or TED hose are used to help limit and resolve swelling.  It is recommended to use Ace wraps or TED hose until you are informed to stop.    When using Ace  Wraps start the wrapping distally (farthest away from the body) and wrap proximally (closer to the body)   Example: If you had surgery on your leg or thing and you do not have a splint on, start the ace wrap at the toes and work your way up to the thigh        If you had surgery on your upper extremity and do not have a splint on, start the ace wrap at your fingers and work your way up to the upper arm     [place]: [phone]   VISIT OUR WEBSITE FOR ADDITIONAL INFORMATION: [email or web address]     Discharge Wound Care Instructions  Do NOT apply any ointments, solutions or lotions to pin sites or surgical wounds.  These prevent needed drainage and even though solutions like hydrogen peroxide kill bacteria, they also damage cells lining the pin sites that help fight infection.  Applying lotions or ointments can keep the wounds moist and can cause them to breakdown and open up as well. This can increase the risk for infection. When in doubt call the office.  Surgical incisions should be dressed daily.  If any drainage is noted, use one layer of adaptic, then gauze, Kerlix, and an ace wrap.  Once the incision is completely dry and without drainage, it may be left open to air out.  Showering may begin 36-48 hours later.  Cleaning gently with soap and water.  Traumatic wounds should be dressed daily as well.    One layer of adaptic, gauze, Kerlix, then ace wrap.  The adaptic can be discontinued once the draining has ceased    If you have a wet to dry dressing: wet the gauze with saline the squeeze as much saline out so the gauze is moist (not soaking wet), place moistened gauze over wound, then place a dry gauze over the moist one, followed by Kerlix wrap, then ace wrap.

## 2019-05-14 NOTE — Anesthesia Postprocedure Evaluation (Signed)
Anesthesia Post Note  Patient: Chelsea Roberson  Procedure(s) Performed: HARDWARE REMOVAL RIGHT FEMUR (Right Leg Upper)     Patient location during evaluation: PACU Anesthesia Type: Spinal Level of consciousness: oriented and awake and alert Pain management: pain level controlled Vital Signs Assessment: post-procedure vital signs reviewed and stable Respiratory status: spontaneous breathing and respiratory function stable Cardiovascular status: blood pressure returned to baseline and stable Postop Assessment: no headache, no backache and no apparent nausea or vomiting Anesthetic complications: no    Last Vitals:  Vitals:   05/14/19 0702  BP: 120/73  Pulse: 60  Resp: 16  Temp: 37.1 C  SpO2: 100%    Last Pain:  Vitals:   05/14/19 0930  PainSc: 0-No pain                 Jarome Matin Cuong Moorman

## 2019-05-14 NOTE — Transfer of Care (Signed)
Immediate Anesthesia Transfer of Care Note  Patient: Chelsea Roberson  Procedure(s) Performed: HARDWARE REMOVAL RIGHT FEMUR (Right Leg Upper)  Patient Location: PACU  Anesthesia Type:Spinal  Level of Consciousness: awake, alert  and oriented  Airway & Oxygen Therapy: Patient Spontanous Breathing and Patient connected to face mask oxygen  Post-op Assessment: Report given to RN and Post -op Vital signs reviewed and stable  Post vital signs: Reviewed and stable  Last Vitals:  Vitals Value Taken Time  BP 91/58 05/14/19 0927  Temp    Pulse 88 05/14/19 0930  Resp 17 05/14/19 0930  SpO2 99 % 05/14/19 0930  Vitals shown include unvalidated device data.  Last Pain:  Vitals:   05/14/19 0728  PainSc: 0-No pain      Patients Stated Pain Goal: 3 (81/85/63 1497)  Complications: No apparent anesthesia complications

## 2019-05-15 ENCOUNTER — Encounter (HOSPITAL_COMMUNITY): Payer: Self-pay | Admitting: Student

## 2019-05-19 ENCOUNTER — Encounter (INDEPENDENT_AMBULATORY_CARE_PROVIDER_SITE_OTHER): Payer: Medicare Other | Admitting: Ophthalmology

## 2019-05-19 ENCOUNTER — Other Ambulatory Visit: Payer: Self-pay

## 2019-05-19 DIAGNOSIS — I10 Essential (primary) hypertension: Secondary | ICD-10-CM

## 2019-05-19 DIAGNOSIS — H35033 Hypertensive retinopathy, bilateral: Secondary | ICD-10-CM | POA: Diagnosis not present

## 2019-05-19 DIAGNOSIS — E11311 Type 2 diabetes mellitus with unspecified diabetic retinopathy with macular edema: Secondary | ICD-10-CM

## 2019-05-19 DIAGNOSIS — E113313 Type 2 diabetes mellitus with moderate nonproliferative diabetic retinopathy with macular edema, bilateral: Secondary | ICD-10-CM

## 2019-05-19 DIAGNOSIS — H43813 Vitreous degeneration, bilateral: Secondary | ICD-10-CM

## 2019-05-21 ENCOUNTER — Emergency Department (HOSPITAL_COMMUNITY): Payer: Medicare Other

## 2019-05-21 ENCOUNTER — Other Ambulatory Visit: Payer: Self-pay

## 2019-05-21 ENCOUNTER — Inpatient Hospital Stay (HOSPITAL_COMMUNITY)
Admission: EM | Admit: 2019-05-21 | Discharge: 2019-05-23 | DRG: 481 | Disposition: A | Discharge status: Home Health | Payer: Medicare Other | Attending: Internal Medicine | Admitting: Internal Medicine

## 2019-05-21 DIAGNOSIS — F419 Anxiety disorder, unspecified: Secondary | ICD-10-CM | POA: Diagnosis present

## 2019-05-21 DIAGNOSIS — S72041A Displaced fracture of base of neck of right femur, initial encounter for closed fracture: Secondary | ICD-10-CM

## 2019-05-21 DIAGNOSIS — I10 Essential (primary) hypertension: Secondary | ICD-10-CM | POA: Diagnosis present

## 2019-05-21 DIAGNOSIS — W010XXA Fall on same level from slipping, tripping and stumbling without subsequent striking against object, initial encounter: Secondary | ICD-10-CM | POA: Diagnosis present

## 2019-05-21 DIAGNOSIS — Z20828 Contact with and (suspected) exposure to other viral communicable diseases: Secondary | ICD-10-CM | POA: Diagnosis present

## 2019-05-21 DIAGNOSIS — Z96649 Presence of unspecified artificial hip joint: Secondary | ICD-10-CM | POA: Diagnosis present

## 2019-05-21 DIAGNOSIS — S7290XA Unspecified fracture of unspecified femur, initial encounter for closed fracture: Secondary | ICD-10-CM

## 2019-05-21 DIAGNOSIS — E785 Hyperlipidemia, unspecified: Secondary | ICD-10-CM | POA: Diagnosis present

## 2019-05-21 DIAGNOSIS — Z79899 Other long term (current) drug therapy: Secondary | ICD-10-CM | POA: Diagnosis not present

## 2019-05-21 DIAGNOSIS — D62 Acute posthemorrhagic anemia: Secondary | ICD-10-CM | POA: Diagnosis not present

## 2019-05-21 DIAGNOSIS — M978XXA Periprosthetic fracture around other internal prosthetic joint, initial encounter: Secondary | ICD-10-CM | POA: Diagnosis present

## 2019-05-21 DIAGNOSIS — E119 Type 2 diabetes mellitus without complications: Secondary | ICD-10-CM | POA: Diagnosis present

## 2019-05-21 DIAGNOSIS — F0390 Unspecified dementia without behavioral disturbance: Secondary | ICD-10-CM | POA: Diagnosis present

## 2019-05-21 DIAGNOSIS — D72829 Elevated white blood cell count, unspecified: Secondary | ICD-10-CM | POA: Diagnosis present

## 2019-05-21 DIAGNOSIS — K219 Gastro-esophageal reflux disease without esophagitis: Secondary | ICD-10-CM | POA: Diagnosis present

## 2019-05-21 DIAGNOSIS — Y92481 Parking lot as the place of occurrence of the external cause: Secondary | ICD-10-CM | POA: Diagnosis not present

## 2019-05-21 DIAGNOSIS — S72391A Other fracture of shaft of right femur, initial encounter for closed fracture: Principal | ICD-10-CM | POA: Diagnosis present

## 2019-05-21 DIAGNOSIS — E876 Hypokalemia: Secondary | ICD-10-CM | POA: Diagnosis present

## 2019-05-21 DIAGNOSIS — Z7984 Long term (current) use of oral hypoglycemic drugs: Secondary | ICD-10-CM

## 2019-05-21 DIAGNOSIS — E039 Hypothyroidism, unspecified: Secondary | ICD-10-CM | POA: Diagnosis present

## 2019-05-21 DIAGNOSIS — W19XXXA Unspecified fall, initial encounter: Secondary | ICD-10-CM | POA: Diagnosis not present

## 2019-05-21 DIAGNOSIS — Z90711 Acquired absence of uterus with remaining cervical stump: Secondary | ICD-10-CM

## 2019-05-21 DIAGNOSIS — Y9301 Activity, walking, marching and hiking: Secondary | ICD-10-CM | POA: Diagnosis present

## 2019-05-21 DIAGNOSIS — M9701XA Periprosthetic fracture around internal prosthetic right hip joint, initial encounter: Secondary | ICD-10-CM | POA: Diagnosis present

## 2019-05-21 DIAGNOSIS — Z79890 Hormone replacement therapy (postmenopausal): Secondary | ICD-10-CM

## 2019-05-21 DIAGNOSIS — Z66 Do not resuscitate: Secondary | ICD-10-CM | POA: Diagnosis present

## 2019-05-21 DIAGNOSIS — G4700 Insomnia, unspecified: Secondary | ICD-10-CM | POA: Diagnosis present

## 2019-05-21 DIAGNOSIS — Z419 Encounter for procedure for purposes other than remedying health state, unspecified: Secondary | ICD-10-CM

## 2019-05-21 DIAGNOSIS — M25551 Pain in right hip: Secondary | ICD-10-CM | POA: Diagnosis present

## 2019-05-21 LAB — BASIC METABOLIC PANEL
Anion gap: 13 (ref 5–15)
BUN: 12 mg/dL (ref 8–23)
CO2: 23 mmol/L (ref 22–32)
Calcium: 9.7 mg/dL (ref 8.9–10.3)
Chloride: 105 mmol/L (ref 98–111)
Creatinine, Ser: 0.59 mg/dL (ref 0.44–1.00)
GFR calc Af Amer: >60 mL/min (ref >60)
GFR calc non Af Amer: >60 mL/min (ref >60)
Glucose, Bld: 166 mg/dL — ABNORMAL HIGH (ref 70–99)
Potassium: 4 mmol/L (ref 3.5–5.1)
Sodium: 141 mmol/L (ref 135–145)

## 2019-05-21 LAB — CBC WITH DIFFERENTIAL/PLATELET
Abs Immature Granulocytes: 0.05 10*3/uL (ref 0.00–0.07)
Basophils Absolute: 0 10*3/uL (ref 0.0–0.1)
Basophils Relative: 0 %
Eosinophils Absolute: 0 10*3/uL (ref 0.0–0.5)
Eosinophils Relative: 0 %
HCT: 44.3 % (ref 36.0–46.0)
Hemoglobin: 14.8 g/dL (ref 12.0–15.0)
Immature Granulocytes: 0 %
Lymphocytes Relative: 6 %
Lymphs Abs: 0.8 10*3/uL (ref 0.7–4.0)
MCH: 32.9 pg (ref 26.0–34.0)
MCHC: 33.4 g/dL (ref 30.0–36.0)
MCV: 98.4 fL (ref 80.0–100.0)
Monocytes Absolute: 0.7 10*3/uL (ref 0.1–1.0)
Monocytes Relative: 5 %
Neutro Abs: 12.1 10*3/uL — ABNORMAL HIGH (ref 1.7–7.7)
Neutrophils Relative %: 89 %
Platelets: 208 10*3/uL (ref 150–400)
RBC: 4.5 MIL/uL (ref 3.87–5.11)
RDW: 13.9 % (ref 11.5–15.5)
WBC: 13.7 10*3/uL — ABNORMAL HIGH (ref 4.0–10.5)
nRBC: 0 % (ref 0.0–0.2)

## 2019-05-21 LAB — TYPE AND SCREEN
ABO/RH(D): A NEG
Antibody Screen: NEGATIVE

## 2019-05-21 LAB — SARS CORONAVIRUS 2 (TAT 6-24 HRS): SARS Coronavirus 2: NEGATIVE

## 2019-05-21 LAB — PROTIME-INR
INR: 1 (ref 0.8–1.2)
Prothrombin Time: 13.5 seconds (ref 11.4–15.2)

## 2019-05-21 LAB — ABO/RH: ABO/RH(D): A NEG

## 2019-05-21 LAB — GLUCOSE, CAPILLARY: Glucose-Capillary: 146 mg/dL — ABNORMAL HIGH (ref 70–99)

## 2019-05-21 MED ORDER — MORPHINE SULFATE (PF) 4 MG/ML IV SOLN
4.0000 mg | Freq: Once | INTRAVENOUS | Status: AC
  Administered 2019-05-21: 4 mg via INTRAVENOUS
  Filled 2019-05-21: qty 1

## 2019-05-21 MED ORDER — FUROSEMIDE 10 MG/ML IJ SOLN: 20.0000 mg | Freq: Once | INTRAMUSCULAR | Status: DC

## 2019-05-21 MED ORDER — DOCUSATE SODIUM 100 MG PO CAPS
100.0000 mg | ORAL_CAPSULE | Freq: Every day | ORAL | Status: DC
  Administered 2019-05-21 – 2019-05-23 (×3): 100 mg via ORAL
  Filled 2019-05-21 (×3): qty 1

## 2019-05-21 MED ORDER — FAMOTIDINE 20 MG PO TABS
20.0000 mg | ORAL_TABLET | Freq: Two times a day (BID) | ORAL | Status: DC
  Administered 2019-05-21 – 2019-05-23 (×4): 20 mg via ORAL
  Filled 2019-05-21 (×4): qty 1

## 2019-05-21 MED ORDER — BUSPIRONE HCL 5 MG PO TABS
7.5000 mg | ORAL_TABLET | Freq: Every day | ORAL | Status: DC
  Administered 2019-05-22 – 2019-05-23 (×2): 7.5 mg via ORAL
  Filled 2019-05-21 (×2): qty 2

## 2019-05-21 MED ORDER — CARVEDILOL 3.125 MG PO TABS
3.1250 mg | ORAL_TABLET | Freq: Two times a day (BID) | ORAL | Status: DC
  Administered 2019-05-21 – 2019-05-23 (×4): 3.125 mg via ORAL
  Filled 2019-05-21 (×5): qty 1

## 2019-05-21 MED ORDER — SIMVASTATIN 20 MG PO TABS
20.0000 mg | ORAL_TABLET | Freq: Every day | ORAL | Status: DC
  Administered 2019-05-21 – 2019-05-22 (×2): 20 mg via ORAL
  Filled 2019-05-21 (×2): qty 1

## 2019-05-21 MED ORDER — ACETAMINOPHEN 325 MG PO TABS
650.0000 mg | ORAL_TABLET | Freq: Four times a day (QID) | ORAL | Status: DC | PRN
  Administered 2019-05-21 – 2019-05-22 (×2): 650 mg via ORAL
  Filled 2019-05-21 (×2): qty 2

## 2019-05-21 MED ORDER — TRAMADOL HCL 50 MG PO TABS
50.0000 mg | ORAL_TABLET | Freq: Four times a day (QID) | ORAL | Status: DC | PRN
  Administered 2019-05-21 – 2019-05-22 (×2): 50 mg via ORAL
  Filled 2019-05-21 (×2): qty 1

## 2019-05-21 MED ORDER — TRAZODONE HCL 50 MG PO TABS
25.0000 mg | ORAL_TABLET | Freq: Every day | ORAL | Status: DC
  Administered 2019-05-21 – 2019-05-22 (×2): 25 mg via ORAL
  Filled 2019-05-21 (×2): qty 1

## 2019-05-21 MED ORDER — LEVOTHYROXINE SODIUM 50 MCG PO TABS
50.0000 ug | ORAL_TABLET | Freq: Every day | ORAL | Status: DC
  Administered 2019-05-22 – 2019-05-23 (×2): 50 ug via ORAL
  Filled 2019-05-21 (×2): qty 1

## 2019-05-21 MED ORDER — AMLODIPINE BESYLATE 2.5 MG PO TABS
2.5000 mg | ORAL_TABLET | Freq: Every day | ORAL | Status: DC
  Administered 2019-05-22 – 2019-05-23 (×2): 2.5 mg via ORAL
  Filled 2019-05-21 (×2): qty 1

## 2019-05-21 MED ORDER — INSULIN ASPART 100 UNIT/ML ~~LOC~~ SOLN
0.0000 [IU] | Freq: Three times a day (TID) | SUBCUTANEOUS | Status: DC
  Administered 2019-05-22 (×2): 2 [IU] via SUBCUTANEOUS
  Administered 2019-05-23: 3 [IU] via SUBCUTANEOUS
  Administered 2019-05-23: 2 [IU] via SUBCUTANEOUS

## 2019-05-21 MED ORDER — METOPROLOL TARTRATE 5 MG/5ML IV SOLN: 2.5000 mg | Freq: Once | INTRAVENOUS | Status: DC

## 2019-05-21 MED ORDER — BUSPIRONE HCL 5 MG PO TABS
15.0000 mg | ORAL_TABLET | Freq: Every day | ORAL | Status: DC
  Administered 2019-05-21 – 2019-05-22 (×2): 15 mg via ORAL
  Filled 2019-05-21: qty 2
  Filled 2019-05-21: qty 1

## 2019-05-21 MED ORDER — BUSPIRONE HCL 15 MG PO TABS: 7.5000 mg | ORAL_TABLET | ORAL | Status: DC

## 2019-05-21 NOTE — H&P (Signed)
[facility] History and Physical    Chelsea OppenheimFrances Roberson, is a 73 y.o. female  MRN: [id number]   DOB - 03/14/1946  Admit Date - 05/21/2019  Outpatient Primary MD for the patient is Merlene LaughterStoneking, Hal, MD  Outpatient Specialists: Dr. Jena GaussHaddix (orthopedics)   Patient coming from: home  Chief Complaint: right leg pain after fall  HPI: Chelsea Roberson is a 73 y.o. female with medical history significant for right intertrochanteric fracture status post intramedullary repair on 05/2018 with hardware removal Dr. Jena GaussHaddix on 12/2 due to persistent pain from right femur stress reaction, dementia, HTN, diabetes, thyroidism who presents on 05/21/2019 with right hip/leg pain after mechanical fall.  While walking to [employer] with her husband patient missed stepping onto a curb and fell backwards onto her husband.  They both fell on the ground.  Her head collided with her husband's head without any loss of consciousness.  She denied any prodromal symptoms, including no chest pain, no lightheadedness, no nausea or vomiting.    Patient had hardware removal on 05/14/2019 by Dr. Jena GaussHaddix due to right femur stress reaction and persistent right leg pain.  Per husband her right leg pain has been greatly improved since that procedure prior to this fall.   ED Course: Afebrile, blood pressure 184/76, WBC 13.7, femur x-ray showing periprosthetic right femoral fracture with lateral displacement.   Review of Systems:   In addition to the HPI above, Constitutional:  No weight loss, night sweats, Fevers, chills, fatigue.  HEENT:  No headaches, Difficulty swallowing,Tooth/dental problems,Sore throat,  No sneezing, itching, ear ache, nasal congestion, post nasal drip,  Cardio-vascular:  No chest pain, Orthopnea, PND, swelling in lower extremities, anasarca, dizziness, palpitations  GI:  No heartburn, indigestion, abdominal pain, nausea, vomiting, diarrhea, change in bowel habits, loss of appetite  Resp:  No shortness of  breath with exertion or at rest. No excess mucus, no productive cough, No non-productive cough, No coughing up of blood.No change in color of mucus.No wheezing.No chest wall deformity  Skin:  no rash or lesions.  GU:  no dysuria, change in color of urine, no urgency or frequency. No flank pain.  Musculoskeletal:  No joint pain or swelling. No decreased range of motion. No back pain.  Psych:  No change in mood or affect. No depression or anxiety. No memory loss.   A full 10 point Review of Systems was done, except as stated above, all other Review of Systems were negative.  Past Medical History:  Diagnosis Date   Anxiety    Arthritis    Closed right hip fracture, initial encounter (HCC) 05/14/2018   Complication of anesthesia    Dementia (HCC)    DM (diabetes mellitus), type 2 (HCC)    GERD (gastroesophageal reflux disease)    Hyperlipidemia    Hypertension    Hypothyroidism    Memory loss    PONV (postoperative nausea and vomiting)    Sleep apnea    history of sleep apnea but pt states that no longer has sleep apnea after weight loss   Past Surgical History:  Procedure Laterality Date   COLONOSCOPY WITH PROPOFOL N/A 05/09/2016   Procedure: COLONOSCOPY WITH PROPOFOL;  Surgeon: Charolett BumpersMartin K Johnson, MD;  Location: [facility];  Service: Endoscopy;  Laterality: N/A;   EYE SURGERY Bilateral    cataracts   FEMUR IM NAIL Right 05/15/2018   Procedure: INTRAMEDULLARY (IM) NAIL FEMORAL;  Surgeon: Roby LoftsHaddix, Kevin P, MD;  Location: [facility] OR;  Service: Orthopedics;  Laterality: Right;  HARDWARE REMOVAL Right 05/14/2019   Procedure: HARDWARE REMOVAL RIGHT FEMUR;  Surgeon: Roby Lofts, MD;  Location: [facility] OR;  Service: Orthopedics;  Laterality: Right;  REMOVED ONE SCREW   PARTIAL HYSTERECTOMY     WRIST SURGERY Right    Social History:  reports that she has never smoked. She has never used smokeless tobacco. She reports that she does not drink alcohol or use drugs.  Allergies   Allergen Reactions   Ciprofloxacin Other (See Comments)    HEADACHE    Sulfa Antibiotics Nausea Only    Family History  Problem Relation Age of Onset   Failure to thrive Mother    CAD Father        CAD diagnosed around 82, passed away at 54 of MI   CAD Brother        Died in his 24s of MI, had had CABG about 2 years before he died     Prior to Admission medications   Medication Sig Start Date End Date Taking? Authorizing Provider  acetaminophen (TYLENOL) 650 MG CR tablet Take 1,300-1,950 mg by mouth every 8 (eight) hours as needed for pain.    Yes [provider]  alendronate (FOSAMAX) 70 MG tablet Take 70 mg by mouth every Sunday.  03/12/18  Yes [provider]  amLODipine (NORVASC) 2.5 MG tablet Take 2.5 mg by mouth daily.   Yes [provider]  Besifloxacin HCl (BESIVANCE) 0.6 % SUSP Place 1 drop into both eyes See admin instructions. Instill 1 drop into both eyes 4 times daily for 2 days after eye injections   Yes [provider]  busPIRone (BUSPAR) 15 MG tablet Take 7.5-15 mg by mouth See admin instructions. 7.5mg  in the morning and  at bedtime   Yes [provider]  Calcium Citrate-Vitamin D (CALCIUM + D PO) Take 1 tablet by mouth daily.   Yes [provider]  canagliflozin (INVOKANA) 100 MG TABS tablet Take 100 mg by mouth daily before breakfast.   Yes [provider]  carvedilol (COREG) 3.125 MG tablet Take 1 tablet (3.125 mg total) by mouth 2 (two) times daily with a meal. 02/20/15  Yes Vassie Loll, MD  Cholecalciferol (VITAMIN D3) 50 MCG (2000 UT) capsule Take 2,000 Units by mouth daily.   Yes [provider]  docusate sodium (COLACE) 100 MG capsule Take 100 mg by mouth daily.   Yes [provider]  famotidine (PEPCID) 20 MG tablet Take 20 mg by mouth 2 (two) times daily.   Yes [provider]  Garlic 1000 MG CAPS Take 1,000 mg by mouth daily.   Yes [provider]    ketorolac (ACULAR) 0.5 % ophthalmic solution Place 1 drop into both eyes See admin instructions. Instill 1 drop into both eyes 4 times daily for 7 days after eye injections   Yes [provider]  levothyroxine (SYNTHROID, LEVOTHROID) 50 MCG tablet Take 50 mcg by mouth daily before breakfast.   Yes [provider]  losartan-hydrochlorothiazide (HYZAAR) 50-12.5 MG per tablet Take 1 tablet by mouth daily.   Yes [provider]  Multiple Vitamin (MULTIVITAMIN WITH MINERALS) TABS Take 1 tablet by mouth daily.    Yes [provider]  NAMZARIC 28-10 MG CP24 Take 28 mg by mouth at bedtime.  03/31/18  Yes [provider]  simvastatin (ZOCOR) 20 MG tablet Take 20 mg by mouth at bedtime.    Yes [provider]  tolterodine (DETROL LA) 4 MG 24 hr  capsule Take 4 mg by mouth at bedtime.   Yes [provider]  traMADol (ULTRAM) 50 MG tablet Take 1 tablet (50 mg total) by mouth every 6 (six) hours as needed for severe pain. 05/14/19  Yes Despina Hidden, PA-C  traZODone (DESYREL) 50 MG tablet Take 25 mg by mouth at bedtime.  04/11/18  Yes [provider]  trimethoprim (TRIMPEX) 100 MG tablet Take 100 mg by mouth at bedtime.  03/18/16  Yes [provider]   Physical Exam: Vitals:   05/21/19 1257 05/21/19 1312 05/21/19 1731 05/21/19 1745  BP: (!) 184/76 (!) 184/76 (!) 152/81 (!) 147/75  Pulse: 64 64 97 91  Resp: 16 16 20 20   Temp: (!) 97.5 F (36.4 C) (!) 97.5 F (36.4 C) 99 F (37.2 C)   TempSrc: Oral Oral Oral   SpO2: 98% 98% 98% 97%    Wt Readings from Last 3 Encounters:  05/14/19 70.8 kg  05/15/18 59 kg  05/09/16 59 kg    Constitutional normal appearing, elderly female Eyes: EOMI, anicteric, normal conjunctivae ENMT: Oropharynx with moist mucous membranes, normal dentition Neck: FROM,  Cardiovascular: RRR no MRGs, with no peripheral edema Respiratory: Normal respiratory effort on room air, clear breath sounds   Abdomen: Soft,non-tender,  Musculoskeletal: Right leg shortened, externally rotated, painful with movement Skin: No rash ulcers, or lesions. Without skin tenting  Neurologic: Grossly no focal neuro deficit. Psychiatric:Appropriate affect, and mood. Mental status AAOx3          Labs on Admission:  Basic Metabolic Panel: Recent Labs  Lab 05/21/19 1545  NA 141  K 4.0  CL 105  CO2 23  GLUCOSE 166*  BUN 12  CREATININE 0.59  CALCIUM 9.7   Liver Function Tests: No results for input(s): AST, ALT, ALKPHOS, BILITOT, PROT, ALBUMIN in the last 168 hours. No results for input(s): LIPASE, AMYLASE in the last 168 hours. No results for input(s): AMMONIA in the last 168 hours. CBC: Recent Labs  Lab 05/21/19 1545  WBC 13.7*  NEUTROABS 12.1*  HGB 14.8  HCT 44.3  MCV 98.4  PLT 208   Cardiac Enzymes: No results for input(s): CKTOTAL, CKMB, CKMBINDEX, TROPONINI in the last 168 hours.  BNP (last 3 results) No results for input(s): BNP in the last 8760 hours.  ProBNP (last 3 results) No results for input(s): PROBNP in the last 8760 hours.  CBG: No results for input(s): GLUCAP in the last 168 hours.  Radiological Exams on Admission: Dg Pelvis 1-2 Views  Result Date: 05/21/2019 CLINICAL DATA:  Fall from standing EXAM: PELVIS - 1-2 VIEW COMPARISON:  May 15, 2018 FINDINGS: The patient is status post intramedullary rod and nail fixation of the right femur. There is partially visualized proximal right femoral periprosthetic fracture. 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 There is diffuse osteopenia. Degenerative changes in the lumbar spine. IMPRESSION: Partially visualized right proximal periprosthetic femur fracture. Electronically Signed   By: May 17, 2018 M.D.   On: 05/21/2019 15:08   Dg Knee Complete 4 Views Right  Result Date: 05/21/2019 CLINICAL DATA:  Knee pain, fall from standing EXAM: RIGHT KNEE - COMPLETE 4+ VIEW COMPARISON:  None. FINDINGS: There is a partially  visualized femoral fracture. No definite fracture seen in the knee. There is diffuse osteopenia. No knee joint effusion is seen. IMPRESSION: Partially visualized proximal femoral fracture. Electronically Signed   By: 14/02/2019 M.D.   On: 05/21/2019 15:10   Dg Femur Min 2 Views Right  Result Date: 05/21/2019  CLINICAL DATA:  Fall, right femur pain and deformity, recent surgery to remove hardware EXAM: RIGHT FEMUR 2 VIEWS COMPARISON:  Femur radiograph 05/14/2019 FINDINGS: There is a periprosthetic right femoral fracture with foreshortening of approximately 12 cm and lateral displacement of 1 shaft width. Fracture line appears to originate at the lateral lucent screw tract from the recently removed hardware previously securing the distal extent of a short segment right femoral intramedullary nail. No other femur fracture is identified. Femoral head remains normally located with a transcervical fixation screw. Extensive soft tissue swelling is noted. IMPRESSION: Periprosthetic right femoral fracture with foreshortening and lateral displacement. Fracture line extends from the lateral lucent screw track at the site of recently removed screw. Extension obliquely through the diaphysis at the terminus of the short segment intramedullary nail. Electronically Signed   By: Lovena Le M.D.   On: 05/21/2019 15:08    EKG: Independently reviewed. unchanged from previous tracings.  Assessment/Plan  Active Problems:   HTN (hypertension)   DM (diabetes mellitus), type 2 (HCC)   Hypothyroidism   GERD (gastroesophageal reflux disease)   Dementia (HCC)   Peri-prosthetic femoral shaft fracture    Periprosthetic right femoral fracture after mechanical fall.  Right intertrochanteric femur fracture on 12/29.,  Followed by hardware removal on 05/14/2019 for stress fracture.  Now presenting after mechanical fall and having periprosthetic fracture. -Dr. Doreatha Doby aware, n.p.o. at midnight for operative repair in  a.m., -Tylenol/tramadol pain control as needed, avoiding IV sedatives in setting of dementia  HTN, elevated on arrival with SBP in 180s.  Likely related to pain -Resume home Coreg, amlodipine, -Hold losartan/HCTZ until kidney function evaluated with BMP   Hypothyroidism, stable. -Continue levothyroxine  Anxiety/insomnia, stable -Continue BuSpar, trazodone for sleep  Dementia without behavioral disturbances, stable.  Alert and oriented x4 -Delirium precautions -Avoid sedative medications  Type 2 diabetes, well controlled, A1c 6 -Hold home Invokana -Monitor CBG, sliding scale as needed  Hyperlipidemia, stable -Continue statin  GERD, stable -Continue home Pepcid  DVT Prophylaxis Heparin -SCDs  AM Labs Ordered, also please review Full Orders  Family Communication: Admission, patients condition and plan of care including tests being ordered have been discussed with the patient and patient's husband who indicate understanding and agree with the plan and Code Status.  Code Status DNR, discussed with patient and husband at bedside  Condition Stable  Consults called: Orthopedic, Dr. Doreatha Doan ( by EDP)    Admission status: inpatient    Desiree Hane M.D on 05/21/2019 at 6:28 PM  To page go to [email or web address] - password [email or web address]   If 7PM-7AM, please contact night-coverage [email or web address] Password [facility]  05/21/2019, 6:28 PM

## 2019-05-21 NOTE — Consult Note (Signed)
Orthopaedic Trauma Service (OTS) Consult   Patient ID: Chelsea Roberson MRN: [id number] DOB/AGE: March 31, 1946 73 y.o.  Reason for Consult:Right femur fracture Referring Physician: Dr. Derwood Kaplan, MD [facility]  HPI: Chelsea Roberson is an 73 y.o. female who is being seen in consultation at the request of Dr. Rhunette Croft for evaluation of right periprosthetic femur fracture.  This is a patient well-known to me who had a right intertrochanteric femur fracture in December 2019.  She underwent cephalomedullary nailing.  She did well but unfortunately last few months she has developed a stress reaction at the distal portion of her nail at the location of her distal interlock.  After extensive discussion with the patient we pursued removal of the distal interlock to see if that decrease the stress reaction to her femur.  This was performed last Wednesday.  She was doing well and ambulating at her previous capabilities when she tripped and fell with both her and her husband lost balance.  She had immediate pain and inability bear weight.  She was brought to emergency room where x-rays showed a periprosthetic femur fracture.  Patient was seen in the emergency room.  Patient is in pain with any attempted range of motion.  She does have baseline dementia and she is complaining about knee and thigh pain.  Her husband is at bedside and is able to provide history.  She denies any other injuries.  Denies any numbness or tingling.  Past Medical History:  Diagnosis Date  . Anxiety   . Arthritis   . Closed right hip fracture, initial encounter (HCC) 05/14/2018  . Complication of anesthesia   . Dementia (HCC)   . DM (diabetes mellitus), type 2 (HCC)   . GERD (gastroesophageal reflux disease)   . Hyperlipidemia   . Hypertension   . Hypothyroidism   . Memory loss   . PONV (postoperative nausea and vomiting)   . Sleep apnea    history of sleep apnea but pt states that no longer has sleep apnea after weight  loss    Past Surgical History:  Procedure Laterality Date  . COLONOSCOPY WITH PROPOFOL N/A 05/09/2016   Procedure: COLONOSCOPY WITH PROPOFOL;  Surgeon: Charolett Bumpers, MD;  Location: [facility];  Service: Endoscopy;  Laterality: N/A;  . EYE SURGERY Bilateral    cataracts  . FEMUR IM NAIL Right 05/15/2018   Procedure: INTRAMEDULLARY (IM) NAIL FEMORAL;  Surgeon: Roby Lofts, MD;  Location: [facility];  Service: Orthopedics;  Laterality: Right;  . HARDWARE REMOVAL Right 05/14/2019   Procedure: HARDWARE REMOVAL RIGHT FEMUR;  Surgeon: Roby Lofts, MD;  Location: [facility];  Service: Orthopedics;  Laterality: Right;  REMOVED ONE SCREW  . PARTIAL HYSTERECTOMY    . WRIST SURGERY Right     Family History  Problem Relation Age of Onset  . Failure to thrive Mother   . CAD Father        CAD diagnosed around 56, passed away at 73 of MI  . CAD Brother        Died in his 44s of MI, had had CABG about 2 years before he died    Social History:  reports that she has never smoked. She has never used smokeless tobacco. She reports that she does not drink alcohol or use drugs.  Allergies:  Allergies  Allergen Reactions  . Ciprofloxacin Other (See Comments)    HEADACHE   . Sulfa Antibiotics Nausea Only    Medications:  No current facility-administered  medications on file prior to encounter.    Current Outpatient Medications on File Prior to Encounter  Medication Sig Dispense Refill  . acetaminophen (TYLENOL) 650 MG CR tablet Take 1,300-1,950 mg by mouth every 8 (eight) hours as needed for pain.     Marland Kitchen alendronate (FOSAMAX) 70 MG tablet Take 70 mg by mouth every Sunday.   8  . amLODipine (NORVASC) 2.5 MG tablet Take 2.5 mg by mouth daily.    Marland Kitchen Besifloxacin HCl (BESIVANCE) 0.6 % SUSP Place 1 drop into both eyes See admin instructions. Instill 1 drop into both eyes 4 times daily for 2 days after eye injections    . busPIRone (BUSPAR) 15 MG tablet Take 7.5-15 mg by mouth See admin instructions.  7.5mg  in the morning and 15mg  at bedtime    . Calcium Citrate-Vitamin D (CALCIUM + D PO) Take 1 tablet by mouth daily.    . canagliflozin (INVOKANA) 100 MG TABS tablet Take 100 mg by mouth daily before breakfast.    . carvedilol (COREG) 3.125 MG tablet Take 1 tablet (3.125 mg total) by mouth 2 (two) times daily with a meal. 60 tablet 2  . Cholecalciferol (VITAMIN D3) 50 MCG (2000 UT) capsule Take 2,000 Units by mouth daily.    Marland Kitchen docusate sodium (COLACE) 100 MG capsule Take 100 mg by mouth daily.    . famotidine (PEPCID) 20 MG tablet Take 20 mg by mouth 2 (two) times daily.    . Garlic 2703 MG CAPS Take 1,000 mg by mouth daily.    Marland Kitchen ketorolac (ACULAR) 0.5 % ophthalmic solution Place 1 drop into both eyes See admin instructions. Instill 1 drop into both eyes 4 times daily for 7 days after eye injections    . levothyroxine (SYNTHROID, LEVOTHROID) 50 MCG tablet Take 50 mcg by mouth daily before breakfast.    . losartan-hydrochlorothiazide (HYZAAR) 50-12.5 MG per tablet Take 1 tablet by mouth daily.    . Multiple Vitamin (MULTIVITAMIN WITH MINERALS) TABS Take 1 tablet by mouth daily.     Marland Kitchen NAMZARIC 28-10 MG CP24 Take 28 mg by mouth at bedtime.   11  . simvastatin (ZOCOR) 20 MG tablet Take 20 mg by mouth at bedtime.     . tolterodine (DETROL LA) 4 MG 24 hr capsule Take 4 mg by mouth at bedtime.    . traMADol (ULTRAM) 50 MG tablet Take 1 tablet (50 mg total) by mouth every 6 (six) hours as needed for severe pain. 20 tablet 0  . traZODone (DESYREL) 50 MG tablet Take 25 mg by mouth at bedtime.   1  . trimethoprim (TRIMPEX) 100 MG tablet Take 100 mg by mouth at bedtime.       ROS: Constitutional: No fever or chills Vision: No changes in vision ENT: No difficulty swallowing CV: No chest pain Pulm: No SOB or wheezing GI: No nausea or vomiting GU: No urgency or inability to hold urine Skin: No poor wound healing Neurologic: No numbness or tingling Psychiatric: No depression or anxiety Heme: No  bruising Allergic: No reaction to medications or food   Exam: Blood pressure (!) 147/75, pulse 91, temperature 99 F (37.2 C), temperature source Oral, resp. rate 20, SpO2 97 %. General: No acute distress Orientation: Awake and alert and oriented to person place Mood and Affect: Cooperative and pleasant Gait: Unable to assess due to her fracture Coordination and balance: Within normal limits  Right lower extremity: Skin without lesions.  Obvious swelling and deformity to the leg.  It is shortened and externally rotated.  Unable to tolerate any range of motion or palpation.  She does have intact dorsiflexion plantarflexion of her ankle and toes.  She has no lymphadenopathy.  Reflexes are within normal limits.  She has a warm well-perfused foot with 2+ DP pulses.  Unable to fully assess instability about the knee or leg otherwise.  Left lower extremity: Skin without lesions. No tenderness to palpation. Full painless ROM, full strength in each muscle groups without evidence of instability.   Medical Decision Making: Data: Imaging: X-rays are reviewed which shows a periprosthetic femoral shaft fracture that is located at the distal tip of the previous cephalomedullary device in the area of the previous distal interlocking screw.  Labs:  Results for orders placed or performed during the hospital encounter of 05/21/19 (from the past 24 hour(s))  Type and screen [facility]     Status: None   Collection Time: 05/21/19  3:39 PM  Result Value Ref Range   ABO/RH(D) A NEG    Antibody Screen NEG    Sample Expiration      05/24/2019,2359 Performed at [facility], [address], [place], [place] [address]   ABO/Rh     Status: None   Collection Time: 05/21/19  3:39 PM  Result Value Ref Range   ABO/RH(D)      A NEG Performed at [facility], [address] [place], [place] [address]   Basic metabolic panel     Status: Abnormal   Collection Time: 05/21/19  3:45  PM  Result Value Ref Range   Sodium 141 135 - 145 mmol/L   Potassium 4.0 3.5 - 5.1 mmol/L   Chloride 105 98 - 111 mmol/L   CO2 23 22 - 32 mmol/L   Glucose, Bld 166 (H) 70 - 99 mg/dL   BUN 12 8 - 23 mg/dL   Creatinine, Ser 0.98 0.44 - 1.00 mg/dL   Calcium 9.7 8.9 - 11.9 mg/dL   GFR calc non Af Amer >60 >60 mL/min   GFR calc Af Amer >60 >60 mL/min   Anion gap 13 5 - 15  CBC WITH DIFFERENTIAL     Status: Abnormal   Collection Time: 05/21/19  3:45 PM  Result Value Ref Range   WBC 13.7 (H) 4.0 - 10.5 K/uL   RBC 4.50 3.87 - 5.11 MIL/uL   Hemoglobin 14.8 12.0 - 15.0 g/dL   HCT 14.7 82.9 - 56.2 %   MCV 98.4 80.0 - 100.0 fL   MCH 32.9 26.0 - 34.0 pg   MCHC 33.4 30.0 - 36.0 g/dL   RDW 13.0 86.5 - 78.4 %   Platelets 208 150 - 400 K/uL   nRBC 0.0 0.0 - 0.2 %   Neutrophils Relative % 89 %   Neutro Abs 12.1 (H) 1.7 - 7.7 K/uL   Lymphocytes Relative 6 %   Lymphs Abs 0.8 0.7 - 4.0 K/uL   Monocytes Relative 5 %   Monocytes Absolute 0.7 0.1 - 1.0 K/uL   Eosinophils Relative 0 %   Eosinophils Absolute 0.0 0.0 - 0.5 K/uL   Basophils Relative 0 %   Basophils Absolute 0.0 0.0 - 0.1 K/uL   Immature Granulocytes 0 %   Abs Immature Granulocytes 0.05 0.00 - 0.07 K/uL  Protime-INR     Status: None   Collection Time: 05/21/19  3:45 PM  Result Value Ref Range   Prothrombin Time 13.5 11.4 - 15.2 seconds   INR  1.0 0.8 - 1.2    Imaging or Labs ordered: No new imaging  Medical history and chart was reviewed and case discussed with medical provider.  Assessment/Plan: 73 year old female with a history of dementia who presents with a right periprosthetic femur fracture.  Unfortunately she has sustained a periprosthetic fracture due to the stress riser that she had from her nail.  She will need to undergo intramedullary nailing of her femur fracture.  I will plan to remove the short nail and place a long nail device.  We will plan to perform this tomorrow.  Risks and benefits were discussed with the  patient's husband.  He agrees to proceed with surgery.  Patient should be n.p.o. after midnight.  I appreciate the hospitalist assistance with admission.  Roby LoftsKevin P. Maclaine Ahola, MD [facility] [phone] (office) [email or web address]

## 2019-05-21 NOTE — H&P (View-Only) (Signed)
Orthopaedic Trauma Service (OTS) Consult   Patient ID: Chelsea Roberson MRN: [id number] DOB/AGE: March 31, 1946 73 y.o.  Reason for Consult:Right femur fracture Referring Physician: Dr. Derwood Kaplan, MD [facility]  HPI: Chelsea Roberson is an 73 y.o. female who is being seen in consultation at the request of Dr. Rhunette Croft for evaluation of right periprosthetic femur fracture.  This is a patient well-known to me who had a right intertrochanteric femur fracture in December 2019.  She underwent cephalomedullary nailing.  She did well but unfortunately last few months she has developed a stress reaction at the distal portion of her nail at the location of her distal interlock.  After extensive discussion with the patient we pursued removal of the distal interlock to see if that decrease the stress reaction to her femur.  This was performed last Wednesday.  She was doing well and ambulating at her previous capabilities when she tripped and fell with both her and her husband lost balance.  She had immediate pain and inability bear weight.  She was brought to emergency room where x-rays showed a periprosthetic femur fracture.  Patient was seen in the emergency room.  Patient is in pain with any attempted range of motion.  She does have baseline dementia and she is complaining about knee and thigh pain.  Her husband is at bedside and is able to provide history.  She denies any other injuries.  Denies any numbness or tingling.  Past Medical History:  Diagnosis Date  . Anxiety   . Arthritis   . Closed right hip fracture, initial encounter (HCC) 05/14/2018  . Complication of anesthesia   . Dementia (HCC)   . DM (diabetes mellitus), type 2 (HCC)   . GERD (gastroesophageal reflux disease)   . Hyperlipidemia   . Hypertension   . Hypothyroidism   . Memory loss   . PONV (postoperative nausea and vomiting)   . Sleep apnea    history of sleep apnea but pt states that no longer has sleep apnea after weight  loss    Past Surgical History:  Procedure Laterality Date  . COLONOSCOPY WITH PROPOFOL N/A 05/09/2016   Procedure: COLONOSCOPY WITH PROPOFOL;  Surgeon: Charolett Bumpers, MD;  Location: [facility];  Service: Endoscopy;  Laterality: N/A;  . EYE SURGERY Bilateral    cataracts  . FEMUR IM NAIL Right 05/15/2018   Procedure: INTRAMEDULLARY (IM) NAIL FEMORAL;  Surgeon: Roby Lofts, MD;  Location: [facility];  Service: Orthopedics;  Laterality: Right;  . HARDWARE REMOVAL Right 05/14/2019   Procedure: HARDWARE REMOVAL RIGHT FEMUR;  Surgeon: Roby Lofts, MD;  Location: [facility];  Service: Orthopedics;  Laterality: Right;  REMOVED ONE SCREW  . PARTIAL HYSTERECTOMY    . WRIST SURGERY Right     Family History  Problem Relation Age of Onset  . Failure to thrive Mother   . CAD Father        CAD diagnosed around 56, passed away at 73 of MI  . CAD Brother        Died in his 44s of MI, had had CABG about 2 years before he died    Social History:  reports that she has never smoked. She has never used smokeless tobacco. She reports that she does not drink alcohol or use drugs.  Allergies:  Allergies  Allergen Reactions  . Ciprofloxacin Other (See Comments)    HEADACHE   . Sulfa Antibiotics Nausea Only    Medications:  No current facility-administered  medications on file prior to encounter.    Current Outpatient Medications on File Prior to Encounter  Medication Sig Dispense Refill  . acetaminophen (TYLENOL) 650 MG CR tablet Take 1,300-1,950 mg by mouth every 8 (eight) hours as needed for pain.     Marland Kitchen alendronate (FOSAMAX) 70 MG tablet Take 70 mg by mouth every Sunday.   8  . amLODipine (NORVASC) 2.5 MG tablet Take 2.5 mg by mouth daily.    Marland Kitchen Besifloxacin HCl (BESIVANCE) 0.6 % SUSP Place 1 drop into both eyes See admin instructions. Instill 1 drop into both eyes 4 times daily for 2 days after eye injections    . busPIRone (BUSPAR) 15 MG tablet Take 7.5-15 mg by mouth See admin instructions.  7.5mg  in the morning and 15mg  at bedtime    . Calcium Citrate-Vitamin D (CALCIUM + D PO) Take 1 tablet by mouth daily.    . canagliflozin (INVOKANA) 100 MG TABS tablet Take 100 mg by mouth daily before breakfast.    . carvedilol (COREG) 3.125 MG tablet Take 1 tablet (3.125 mg total) by mouth 2 (two) times daily with a meal. 60 tablet 2  . Cholecalciferol (VITAMIN D3) 50 MCG (2000 UT) capsule Take 2,000 Units by mouth daily.    Marland Kitchen docusate sodium (COLACE) 100 MG capsule Take 100 mg by mouth daily.    . famotidine (PEPCID) 20 MG tablet Take 20 mg by mouth 2 (two) times daily.    . Garlic 2703 MG CAPS Take 1,000 mg by mouth daily.    Marland Kitchen ketorolac (ACULAR) 0.5 % ophthalmic solution Place 1 drop into both eyes See admin instructions. Instill 1 drop into both eyes 4 times daily for 7 days after eye injections    . levothyroxine (SYNTHROID, LEVOTHROID) 50 MCG tablet Take 50 mcg by mouth daily before breakfast.    . losartan-hydrochlorothiazide (HYZAAR) 50-12.5 MG per tablet Take 1 tablet by mouth daily.    . Multiple Vitamin (MULTIVITAMIN WITH MINERALS) TABS Take 1 tablet by mouth daily.     Marland Kitchen NAMZARIC 28-10 MG CP24 Take 28 mg by mouth at bedtime.   11  . simvastatin (ZOCOR) 20 MG tablet Take 20 mg by mouth at bedtime.     . tolterodine (DETROL LA) 4 MG 24 hr capsule Take 4 mg by mouth at bedtime.    . traMADol (ULTRAM) 50 MG tablet Take 1 tablet (50 mg total) by mouth every 6 (six) hours as needed for severe pain. 20 tablet 0  . traZODone (DESYREL) 50 MG tablet Take 25 mg by mouth at bedtime.   1  . trimethoprim (TRIMPEX) 100 MG tablet Take 100 mg by mouth at bedtime.       ROS: Constitutional: No fever or chills Vision: No changes in vision ENT: No difficulty swallowing CV: No chest pain Pulm: No SOB or wheezing GI: No nausea or vomiting GU: No urgency or inability to hold urine Skin: No poor wound healing Neurologic: No numbness or tingling Psychiatric: No depression or anxiety Heme: No  bruising Allergic: No reaction to medications or food   Exam: Blood pressure (!) 147/75, pulse 91, temperature 99 F (37.2 C), temperature source Oral, resp. rate 20, SpO2 97 %. General: No acute distress Orientation: Awake and alert and oriented to person place Mood and Affect: Cooperative and pleasant Gait: Unable to assess due to her fracture Coordination and balance: Within normal limits  Right lower extremity: Skin without lesions.  Obvious swelling and deformity to the leg.  It is shortened and externally rotated.  Unable to tolerate any range of motion or palpation.  She does have intact dorsiflexion plantarflexion of her ankle and toes.  She has no lymphadenopathy.  Reflexes are within normal limits.  She has a warm well-perfused foot with 2+ DP pulses.  Unable to fully assess instability about the knee or leg otherwise.  Left lower extremity: Skin without lesions. No tenderness to palpation. Full painless ROM, full strength in each muscle groups without evidence of instability.   Medical Decision Making: Data: Imaging: X-rays are reviewed which shows a periprosthetic femoral shaft fracture that is located at the distal tip of the previous cephalomedullary device in the area of the previous distal interlocking screw.  Labs:  Results for orders placed or performed during the hospital encounter of 05/21/19 (from the past 24 hour(s))  Type and screen [facility]     Status: None   Collection Time: 05/21/19  3:39 PM  Result Value Ref Range   ABO/RH(D) A NEG    Antibody Screen NEG    Sample Expiration      05/24/2019,2359 Performed at [facility], [address] [place], [place] [address]   ABO/Rh     Status: None   Collection Time: 05/21/19  3:39 PM  Result Value Ref Range   ABO/RH(D)      A NEG Performed at [facility], [address] [place], [place] [address]   Basic metabolic panel     Status: Abnormal   Collection Time: 05/21/19  3:45  PM  Result Value Ref Range   Sodium 141 135 - 145 mmol/L   Potassium 4.0 3.5 - 5.1 mmol/L   Chloride 105 98 - 111 mmol/L   CO2 23 22 - 32 mmol/L   Glucose, Bld 166 (H) 70 - 99 mg/dL   BUN 12 8 - 23 mg/dL   Creatinine, Ser 0.59 0.44 - 1.00 mg/dL   Calcium 9.7 8.9 - 10.3 mg/dL   GFR calc non Af Amer >60 >60 mL/min   GFR calc Af Amer >60 >60 mL/min   Anion gap 13 5 - 15  CBC WITH DIFFERENTIAL     Status: Abnormal   Collection Time: 05/21/19  3:45 PM  Result Value Ref Range   WBC 13.7 (H) 4.0 - 10.5 K/uL   RBC 4.50 3.87 - 5.11 MIL/uL   Hemoglobin 14.8 12.0 - 15.0 g/dL   HCT 44.3 36.0 - 46.0 %   MCV 98.4 80.0 - 100.0 fL   MCH 32.9 26.0 - 34.0 pg   MCHC 33.4 30.0 - 36.0 g/dL   RDW 13.9 11.5 - 15.5 %   Platelets 208 150 - 400 K/uL   nRBC 0.0 0.0 - 0.2 %   Neutrophils Relative % 89 %   Neutro Abs 12.1 (H) 1.7 - 7.7 K/uL   Lymphocytes Relative 6 %   Lymphs Abs 0.8 0.7 - 4.0 K/uL   Monocytes Relative 5 %   Monocytes Absolute 0.7 0.1 - 1.0 K/uL   Eosinophils Relative 0 %   Eosinophils Absolute 0.0 0.0 - 0.5 K/uL   Basophils Relative 0 %   Basophils Absolute 0.0 0.0 - 0.1 K/uL   Immature Granulocytes 0 %   Abs Immature Granulocytes 0.05 0.00 - 0.07 K/uL  Protime-INR     Status: None   Collection Time: 05/21/19  3:45 PM  Result Value Ref Range   Prothrombin Time 13.5 11.4 - 15.2 seconds   INR   1.0 0.8 - 1.2    Imaging or Labs ordered: No new imaging  Medical history and chart was reviewed and case discussed with medical provider.  Assessment/Plan: 73 year old female with a history of dementia who presents with a right periprosthetic femur fracture.  Unfortunately she has sustained a periprosthetic fracture due to the stress riser that she had from her nail.  She will need to undergo intramedullary nailing of her femur fracture.  I will plan to remove the short nail and place a long nail device.  We will plan to perform this tomorrow.  Risks and benefits were discussed with the  patient's husband.  He agrees to proceed with surgery.  Patient should be n.p.o. after midnight.  I appreciate the hospitalist assistance with admission.  Roby LoftsKevin P. Haddix, MD [facility] [phone] (office) [email or web address]

## 2019-05-21 NOTE — ED Provider Notes (Signed)
Patient here with R femoral fracture after mechanical fall.  She is HDS.  Dr. Doreatha Mansel aware. Patient will be admitted by Dr. Lonny Prude.      Margarita Mail, PA-C 05/21/19 2312    Lennice Sites, DO 05/22/19 [phone]

## 2019-05-21 NOTE — ED Notes (Signed)
This RN attempted IV access twice without success, another RN to try 

## 2019-05-21 NOTE — ED Provider Notes (Signed)
[facility] EMERGENCY DEPARTMENT Provider Note   CSN: [id number] Arrival date & time: 05/21/19  1241     History   Chief Complaint Chief Complaint  Patient presents with  . Fall    HPI Chelsea Roberson is a 73 y.o. female with a past medical history significant for anxiety, dementia, diabetes mellitus type 2, GERD, hyperlipidemia, hypertension, hypothyroidism, and sleep apnea who presents to the ED via EMS after a mechanical fall that occurred just prior to arrival.  Husband at bedside who provided majority of the history given patient's history of dementia.  Husband states that patient was walking into [employer] and tripped over the curb and fell backwards onto her husband.  Patient states the front aspect of her head collided with her husband's head.  Patient denies loss of consciousness.  She is not currently on any blood thinners.  Patient denies headache, nausea, and vision changes. Patient was unable to ambulate after the fall. Patient admits to severe right hip, femur, knee pain associated with mild edema of the knee.  Pain is worse with movement.  No pain medication prior to arrival.  Patient recently had hardware removed from her right hip on 05/13/2019 after a cephalomedullar nailing of right intertrochanteric femur fracture on 05/16/2019 status post mechanical fall.   Level 5 caveat due to dementia.   Past Medical History:  Diagnosis Date  . Anxiety   . Arthritis   . Closed right hip fracture, initial encounter (HCC) 05/14/2018  . Complication of anesthesia   . Dementia (HCC)   . DM (diabetes mellitus), type 2 (HCC)   . GERD (gastroesophageal reflux disease)   . Hyperlipidemia   . Hypertension   . Hypothyroidism   . Memory loss   . PONV (postoperative nausea and vomiting)   . Sleep apnea    history of sleep apnea but pt states that no longer has sleep apnea after weight loss    Patient Active Problem List   Diagnosis Date Noted  . Peri-prosthetic  femoral shaft fracture 05/21/2019  . Painful orthopaedic hardware (HCC) 05/14/2019  . Closed fracture of right hip (HCC) 04/29/2019  . Closed comminuted intertrochanteric fracture of proximal end of right femur (HCC) 05/14/2018  . Dementia (HCC)   . Pain in the chest   . Chest pain 02/19/2015  . HTN (hypertension) 02/19/2015  . Hyperlipidemia 02/19/2015  . DM (diabetes mellitus), type 2 (HCC) 02/19/2015  . Hypothyroidism 02/19/2015  . GERD (gastroesophageal reflux disease) 02/19/2015  . Anxiety 02/19/2015  . Dizziness 11/02/2014  . Palpitations 11/02/2014    Past Surgical History:  Procedure Laterality Date  . COLONOSCOPY WITH PROPOFOL N/A 05/09/2016   Procedure: COLONOSCOPY WITH PROPOFOL;  Surgeon: Charolett BumpersMartin K Johnson, MD;  Location: [facility];  Service: Endoscopy;  Laterality: N/A;  . EYE SURGERY Bilateral    cataracts  . FEMUR IM NAIL Right 05/15/2018   Procedure: INTRAMEDULLARY (IM) NAIL FEMORAL;  Surgeon: Roby LoftsHaddix, Kevin P, MD;  Location: [facility] OR;  Service: Orthopedics;  Laterality: Right;  . HARDWARE REMOVAL Right 05/14/2019   Procedure: HARDWARE REMOVAL RIGHT FEMUR;  Surgeon: Roby LoftsHaddix, Kevin P, MD;  Location: [facility] OR;  Service: Orthopedics;  Laterality: Right;  REMOVED ONE SCREW  . PARTIAL HYSTERECTOMY    . WRIST SURGERY Right      OB History   No obstetric history on file.      Home Medications    Prior to Admission medications   Medication Sig Start Date End Date Taking? Authorizing Provider  acetaminophen (TYLENOL) 650 MG CR tablet Take 1,300-1,950 mg by mouth every 8 (eight) hours as needed for pain.    Yes [provider]  alendronate (FOSAMAX) 70 MG tablet Take 70 mg by mouth every Sunday.  03/12/18  Yes [provider]  amLODipine (NORVASC) 2.5 MG tablet Take 2.5 mg by mouth daily.   Yes [provider]  Besifloxacin HCl (BESIVANCE) 0.6 % SUSP Place 1 drop into both eyes See admin instructions. Instill 1 drop into both eyes 4 times daily for 2  days after eye injections   Yes [provider]  busPIRone (BUSPAR) 15 MG tablet Take 7.5-15 mg by mouth See admin instructions. 7.5mg  in the morning and  at bedtime   Yes [provider]  Calcium Citrate-Vitamin D (CALCIUM + D PO) Take 1 tablet by mouth daily.   Yes [provider]  canagliflozin (INVOKANA) 100 MG TABS tablet Take 100 mg by mouth daily before breakfast.   Yes [provider]  carvedilol (COREG) 3.125 MG tablet Take 1 tablet (3.125 mg total) by mouth 2 (two) times daily with a meal. 02/20/15  Yes Vassie Loll, MD  Cholecalciferol (VITAMIN D3) 50 MCG (2000 UT) capsule Take 2,000 Units by mouth daily.   Yes [provider]  docusate sodium (COLACE) 100 MG capsule Take 100 mg by mouth daily.   Yes [provider]  famotidine (PEPCID) 20 MG tablet Take 20 mg by mouth 2 (two) times daily.   Yes [provider]  Garlic 1000 MG CAPS Take 1,000 mg by mouth daily.   Yes [provider]  ketorolac (ACULAR) 0.5 % ophthalmic solution Place 1 drop into both eyes See admin instructions. Instill 1 drop into both eyes 4 times daily for 7 days after eye injections   Yes [provider]  levothyroxine (SYNTHROID, LEVOTHROID) 50 MCG tablet Take 50 mcg by mouth daily before breakfast.   Yes [provider]  losartan-hydrochlorothiazide (HYZAAR) 50-12.5 MG per tablet Take 1 tablet by mouth daily.   Yes [provider]  Multiple Vitamin (MULTIVITAMIN WITH MINERALS) TABS Take 1 tablet by mouth daily.    Yes [provider]  NAMZARIC 28-10 MG CP24 Take 28 mg by mouth at bedtime.  03/31/18  Yes [provider]  simvastatin (ZOCOR) 20 MG tablet Take 20 mg by mouth at bedtime.    Yes [provider]  tolterodine (DETROL LA) 4 MG 24 hr capsule Take 4 mg by mouth at bedtime.   Yes [provider]  traMADol (ULTRAM) 50 MG tablet Take 1 tablet (50 mg total) by mouth every 6  (six) hours as needed for severe pain. 05/14/19  Yes Despina Hidden, PA-C  traZODone (DESYREL) 50 MG tablet Take 25 mg by mouth at bedtime.  04/11/18  Yes [provider]  trimethoprim (TRIMPEX) 100 MG tablet Take 100 mg by mouth at bedtime.  03/18/16  Yes [provider]    Family History Family History  Problem Relation Age of Onset  . Failure to thrive Mother   . CAD Father        CAD diagnosed around 55, passed away at 68 of MI  . CAD Brother        Died in his 63s of MI, had had CABG about 2 years before he died    Social History Social History   Tobacco Use  . Smoking status: Never Smoker  . Smokeless tobacco: Never Used  Substance Use Topics  .  Alcohol use: No  . Drug use: No     Allergies   Ciprofloxacin and Sulfa antibiotics   Review of Systems Review of Systems  Unable to perform ROS: Dementia     Physical Exam Updated Vital Signs BP (!) 147/75   Pulse 91   Temp 99 F (37.2 C) (Oral)   Resp 20   SpO2 97%   Physical Exam Vitals signs and nursing note reviewed.  Constitutional:      General: She is not in acute distress.    Appearance: She is not ill-appearing.  HENT:     Head: Normocephalic.  Eyes:     Pupils: Pupils are equal, round, and reactive to light.  Neck:     Musculoskeletal: Neck supple.  Cardiovascular:     Rate and Rhythm: Normal rate and regular rhythm.     Pulses: Normal pulses.     Heart sounds: Normal heart sounds. No murmur. No friction rub. No gallop.   Pulmonary:     Effort: Pulmonary effort is normal.     Breath sounds: Normal breath sounds.  Abdominal:     General: Abdomen is flat. Bowel sounds are normal. There is no distension.     Palpations: Abdomen is soft.  Musculoskeletal:     Comments: Right leg externally rotated from hip. Tenderness to palpation over right hip, femur, and right knee. Right knee with mild edema. Unable to move entire right leg from hip down to knee. Full ROM of right ankle and  all toes. Soft compartments. Distals pulses and sensation intact bilaterally.   Skin:    General: Skin is warm and dry.  Neurological:     General: No focal deficit present.     Mental Status: She is alert. Mental status is at baseline.     Comments: No aphasia or facial droop      ED Treatments / Results  Labs (all labs ordered are listed, but only abnormal results are displayed) Labs Reviewed  BASIC METABOLIC PANEL - Abnormal; Notable for the following components:      Result Value   Glucose, Bld 166 (*)    All other components within normal limits  CBC WITH DIFFERENTIAL/PLATELET - Abnormal; Notable for the following components:   WBC 13.7 (*)    Neutro Abs 12.1 (*)    All other components within normal limits  SARS CORONAVIRUS 2 (TAT 6-24 HRS)  PROTIME-INR  COMPREHENSIVE METABOLIC PANEL  CBC  BASIC METABOLIC PANEL  TYPE AND SCREEN  ABO/RH    EKG None  Radiology Dg Pelvis 1-2 Views  Result Date: 05/21/2019 CLINICAL DATA:  Fall from standing EXAM: PELVIS - 1-2 VIEW COMPARISON:  May 15, 2018 FINDINGS: The patient is status post intramedullary rod and nail fixation of the right femur. There is partially visualized proximal right femoral periprosthetic fracture. 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 There is diffuse osteopenia. Degenerative changes in the lumbar spine. IMPRESSION: Partially visualized right proximal periprosthetic femur fracture. Electronically Signed   By: Jonna Clark M.D.   On: 05/21/2019 15:08   Dg Knee Complete 4 Views Right  Result Date: 05/21/2019 CLINICAL DATA:  Knee pain, fall from standing EXAM: RIGHT KNEE - COMPLETE 4+ VIEW COMPARISON:  None. FINDINGS: There is a partially visualized femoral fracture. No definite fracture seen in the knee. There is diffuse osteopenia. No knee joint effusion is seen. IMPRESSION: Partially visualized proximal femoral fracture. Electronically Signed   By: Jonna Clark M.D.   On: 05/21/2019 15:10  Dg  Femur Min 2 Views Right  Result Date: 05/21/2019 CLINICAL DATA:  Fall, right femur pain and deformity, recent surgery to remove hardware EXAM: RIGHT FEMUR 2 VIEWS COMPARISON:  Femur radiograph 05/14/2019 FINDINGS: There is a periprosthetic right femoral fracture with foreshortening of approximately 12 cm and lateral displacement of 1 shaft width. Fracture line appears to originate at the lateral lucent screw tract from the recently removed hardware previously securing the distal extent of a short segment right femoral intramedullary nail. No other femur fracture is identified. Femoral head remains normally located with a transcervical fixation screw. Extensive soft tissue swelling is noted. IMPRESSION: Periprosthetic right femoral fracture with foreshortening and lateral displacement. Fracture line extends from the lateral lucent screw track at the site of recently removed screw. Extension obliquely through the diaphysis at the terminus of the short segment intramedullary nail. Electronically Signed   By: Lovena Le M.D.   On: 05/21/2019 15:08    Procedures Procedures (including critical care time)  Medications Ordered in ED Medications  acetaminophen (TYLENOL) tablet 650 mg (has no administration in time range)  traMADol (ULTRAM) tablet 50 mg (has no administration in time range)  amLODipine (NORVASC) tablet 2.5 mg (has no administration in time range)  carvedilol (COREG) tablet 3.125 mg (has no administration in time range)  simvastatin (ZOCOR) tablet 20 mg (has no administration in time range)  traZODone (DESYREL) tablet 25 mg (has no administration in time range)  levothyroxine (SYNTHROID) tablet 50 mcg (has no administration in time range)  docusate sodium (COLACE) capsule 100 mg (has no administration in time range)  famotidine (PEPCID) tablet 20 mg (has no administration in time range)  insulin aspart (novoLOG) injection 0-9 Units (has no administration in time range)  morphine 4 MG/ML  injection 4 mg (4 mg Intravenous Given 05/21/19 1358)     Initial Impression / Assessment and Plan / ED Course  I have reviewed the triage vital signs and the nursing notes.  Pertinent labs & imaging results that were available during my care of the patient were reviewed by me and considered in my medical decision making (see chart for details).  Clinical Course as of May 20 1832  Wed May 21, 2019  1653 Spoke to Dr. Doreatha Fellenz who recommends medical admission and will schedule surgery for tomorrow. COVID test ordered.    [CC]    Clinical Course User Index [CC] Jonette Eva, PA-C      73 year old female presents to the ED status post mechanical fall.  Patient recently had hardware removed from her right hip on 05/13/2019 after a cephalomedullar nailing of right intertrochanteric femur fracture on 05/16/2019 status post mechanical fall. Patient afebrile, not tachycardia or hypoxia. Mild elevation of BP at 184/76 likely due to pain. Will continue to monitor. Patient in no acute distress and non-toxic appearing, but appears to be very uncomfortable. Right leg externally rotated with tenderness to palpation over hip, femur, and knee. No ROM of right leg from the hip down to the knee. Full ROM of ankle and toes. Neurovascularly intact. Soft compartments. Concerned for fracture given deformity. Will order knee, femur, and hip x-ray to rule out bony fracture. Basic labs ordered given patient will most likely need to be admitted.  X-ray personally reviewed which demonstrates: Periprosthetic right femoral fracture with foreshortening and  lateral displacement. Fracture line extends from the lateral lucent  screw track at the site of recently removed screw. Extension  obliquely through the diaphysis at the terminus of the short  segment  intramedullary nail.   CBC significant for leukocytosis at 13.7 likely due to stress reaction, but otherwise reassuring. INR/PT normal. EKG reviewed which demonstrates  sinus rhythm with no signs of ischemia. Spoke to Dr. Jena Gauss regarding patient's case. See note above. COVID test ordered.  Patient handed off to Arthor Captain, PA-C who will follow-up with medical admission consult.  Case discussed with Dr. Rhunette Croft who evaluated patient at bedside and agrees with assessment and plan. Final Clinical Impressions(s) / ED Diagnoses   Final diagnoses:  Closed displaced basicervical fracture of right femur, initial encounter [facility])    ED Discharge Orders    None       Renee Harder, PA-C 05/21/19 Luetta Nutting, MD 05/23/19 1934

## 2019-05-21 NOTE — ED Notes (Signed)
Admitting MD at the bedside.  

## 2019-05-21 NOTE — ED Triage Notes (Addendum)
Per EMS: Pt fell at [facility] when attempting to step up onto a curb. Pt was with husband at the time. Pt had a previous fall and has cognitive issues related to that fall per family, also had a pin and screw removed last week from right hip/leg.  Pt denies any LOC or hitting her head. Complaining of pain in right hip and pain. No obvious deformity.  EMS vitals: BP 150/80 HR 73 Spo2 98% RA Temp 98.6   Pt Covid negative last week.

## 2019-05-22 ENCOUNTER — Encounter (HOSPITAL_COMMUNITY): Payer: Self-pay | Admitting: Internal Medicine

## 2019-05-22 ENCOUNTER — Inpatient Hospital Stay (HOSPITAL_COMMUNITY): Payer: Medicare Other

## 2019-05-22 ENCOUNTER — Inpatient Hospital Stay (HOSPITAL_COMMUNITY): Payer: Medicare Other | Admitting: Certified Registered"

## 2019-05-22 ENCOUNTER — Encounter (HOSPITAL_COMMUNITY)
Admission: EM | Disposition: A | Discharge status: Home Health | Payer: Self-pay | Source: Home / Self Care | Attending: Internal Medicine

## 2019-05-22 DIAGNOSIS — E119 Type 2 diabetes mellitus without complications: Secondary | ICD-10-CM

## 2019-05-22 DIAGNOSIS — F0390 Unspecified dementia without behavioral disturbance: Secondary | ICD-10-CM

## 2019-05-22 DIAGNOSIS — E039 Hypothyroidism, unspecified: Secondary | ICD-10-CM

## 2019-05-22 DIAGNOSIS — M978XXA Periprosthetic fracture around other internal prosthetic joint, initial encounter: Secondary | ICD-10-CM

## 2019-05-22 DIAGNOSIS — K219 Gastro-esophageal reflux disease without esophagitis: Secondary | ICD-10-CM

## 2019-05-22 DIAGNOSIS — Z96649 Presence of unspecified artificial hip joint: Secondary | ICD-10-CM

## 2019-05-22 DIAGNOSIS — I10 Essential (primary) hypertension: Secondary | ICD-10-CM

## 2019-05-22 HISTORY — PX: FEMUR IM NAIL: SHX1597

## 2019-05-22 LAB — BASIC METABOLIC PANEL
Anion gap: 13 (ref 5–15)
BUN: 14 mg/dL (ref 8–23)
CO2: 24 mmol/L (ref 22–32)
Calcium: 9.3 mg/dL (ref 8.9–10.3)
Chloride: 101 mmol/L (ref 98–111)
Creatinine, Ser: 0.6 mg/dL (ref 0.44–1.00)
GFR calc Af Amer: >60 mL/min (ref >60)
GFR calc non Af Amer: >60 mL/min (ref >60)
Glucose, Bld: 172 mg/dL — ABNORMAL HIGH (ref 70–99)
Potassium: 4.4 mmol/L (ref 3.5–5.1)
Sodium: 138 mmol/L (ref 135–145)

## 2019-05-22 LAB — CBC
HCT: 43.2 % (ref 36.0–46.0)
Hemoglobin: 14.4 g/dL (ref 12.0–15.0)
MCH: 33.2 pg (ref 26.0–34.0)
MCHC: 33.3 g/dL (ref 30.0–36.0)
MCV: 99.5 fL (ref 80.0–100.0)
Platelets: 228 10*3/uL (ref 150–400)
RBC: 4.34 MIL/uL (ref 3.87–5.11)
RDW: 14 % (ref 11.5–15.5)
WBC: 9.6 10*3/uL (ref 4.0–10.5)
nRBC: 0 % (ref 0.0–0.2)

## 2019-05-22 LAB — GLUCOSE, CAPILLARY
Glucose-Capillary: 188 mg/dL — ABNORMAL HIGH (ref 70–99)
Glucose-Capillary: 128 mg/dL — ABNORMAL HIGH (ref 70–99)
Glucose-Capillary: 125 mg/dL — ABNORMAL HIGH (ref 70–99)
Glucose-Capillary: 192 mg/dL — ABNORMAL HIGH (ref 70–99)
Glucose-Capillary: 220 mg/dL — ABNORMAL HIGH (ref 70–99)

## 2019-05-22 LAB — SURGICAL PCR SCREEN
MRSA, PCR: NEGATIVE
Staphylococcus aureus: NEGATIVE

## 2019-05-22 SURGERY — INSERTION, INTRAMEDULLARY ROD, FEMUR
Anesthesia: Spinal | Laterality: Right

## 2019-05-22 MED ORDER — SODIUM CHLORIDE 0.9 % IV SOLN
INTRAVENOUS | Status: DC
  Administered 2019-05-22: 16:00:00 via INTRAVENOUS

## 2019-05-22 MED ORDER — 0.9 % SODIUM CHLORIDE (POUR BTL) OPTIME
TOPICAL | Status: DC | PRN
  Administered 2019-05-22: 1000 mL

## 2019-05-22 MED ORDER — TRAMADOL HCL 50 MG PO TABS
50.0000 mg | ORAL_TABLET | Freq: Four times a day (QID) | ORAL | Status: DC | PRN
  Administered 2019-05-23: 50 mg via ORAL
  Filled 2019-05-22: qty 1

## 2019-05-22 MED ORDER — GABAPENTIN 100 MG PO CAPS
100.0000 mg | ORAL_CAPSULE | Freq: Three times a day (TID) | ORAL | Status: DC
  Administered 2019-05-22 – 2019-05-23 (×3): 100 mg via ORAL
  Filled 2019-05-22 (×3): qty 1

## 2019-05-22 MED ORDER — LACTATED RINGERS IV SOLN
INTRAVENOUS | Status: DC
  Administered 2019-05-22: 10:00:00 via INTRAVENOUS

## 2019-05-22 MED ORDER — VANCOMYCIN HCL 500 MG IV SOLR
INTRAVENOUS | Status: DC | PRN
  Administered 2019-05-22: 500 mg via TOPICAL

## 2019-05-22 MED ORDER — ENSURE ENLIVE PO LIQD
237.0000 mL | Freq: Two times a day (BID) | ORAL | Status: DC
  Administered 2019-05-22 – 2019-05-23 (×3): 237 mL via ORAL

## 2019-05-22 MED ORDER — PHENYLEPHRINE HCL (PRESSORS) 10 MG/ML IV SOLN
INTRAVENOUS | Status: DC | PRN
  Administered 2019-05-22 (×7): 80 ug via INTRAVENOUS

## 2019-05-22 MED ORDER — CEFAZOLIN SODIUM-DEXTROSE 2-3 GM-%(50ML) IV SOLR
INTRAVENOUS | Status: DC | PRN
  Administered 2019-05-22: 2 g via INTRAVENOUS

## 2019-05-22 MED ORDER — FENTANYL CITRATE (PF) 250 MCG/5ML IJ SOLN
INTRAMUSCULAR | Status: DC | PRN
  Administered 2019-05-22 (×2): 50 ug via INTRAVENOUS

## 2019-05-22 MED ORDER — PROPOFOL 10 MG/ML IV BOLUS
INTRAVENOUS | Status: AC
  Filled 2019-05-22: qty 20

## 2019-05-22 MED ORDER — ADULT MULTIVITAMIN W/MINERALS CH
1.0000 | ORAL_TABLET | Freq: Every day | ORAL | Status: DC
  Administered 2019-05-22 – 2019-05-23 (×2): 1 via ORAL
  Filled 2019-05-22 (×2): qty 1

## 2019-05-22 MED ORDER — BUPIVACAINE IN DEXTROSE 0.75-8.25 % IT SOLN
INTRATHECAL | Status: DC | PRN
  Administered 2019-05-22: 1.8 mg via INTRATHECAL

## 2019-05-22 MED ORDER — PROPOFOL 500 MG/50ML IV EMUL
INTRAVENOUS | Status: DC | PRN
  Administered 2019-05-22: 30 ug/kg/min via INTRAVENOUS
  Administered 2019-05-22: 50 ug/kg/min via INTRAVENOUS

## 2019-05-22 MED ORDER — ACETAMINOPHEN 500 MG PO TABS
1000.0000 mg | ORAL_TABLET | Freq: Four times a day (QID) | ORAL | Status: DC
  Administered 2019-05-22 – 2019-05-23 (×5): 1000 mg via ORAL
  Filled 2019-05-22 (×5): qty 2

## 2019-05-22 MED ORDER — MORPHINE SULFATE (PF) 2 MG/ML IV SOLN: 2.0000 mg | INTRAVENOUS | Status: DC | PRN

## 2019-05-22 MED ORDER — HYDROMORPHONE HCL 1 MG/ML IJ SOLN: 0.2500 mg | INTRAMUSCULAR | Status: DC | PRN

## 2019-05-22 MED ORDER — JUVEN PO PACK
1.0000 | PACK | Freq: Two times a day (BID) | ORAL | Status: DC
  Administered 2019-05-22 – 2019-05-23 (×3): 1 via ORAL
  Filled 2019-05-22 (×3): qty 1

## 2019-05-22 MED ORDER — CEFAZOLIN SODIUM-DEXTROSE 2-4 GM/100ML-% IV SOLN
INTRAVENOUS | Status: AC
  Filled 2019-05-22: qty 100

## 2019-05-22 MED ORDER — LIDOCAINE 2% (20 MG/ML) 5 ML SYRINGE
INTRAMUSCULAR | Status: AC
  Filled 2019-05-22: qty 5

## 2019-05-22 MED ORDER — LACTATED RINGERS IV SOLN
INTRAVENOUS | Status: DC | PRN
  Administered 2019-05-22: 10:00:00 via INTRAVENOUS

## 2019-05-22 MED ORDER — PROPOFOL 1000 MG/100ML IV EMUL
INTRAVENOUS | Status: AC
  Filled 2019-05-22: qty 100

## 2019-05-22 MED ORDER — MEPERIDINE HCL 25 MG/ML IJ SOLN: 6.2500 mg | INTRAMUSCULAR | Status: DC | PRN

## 2019-05-22 MED ORDER — ONDANSETRON HCL 4 MG/2ML IJ SOLN: 4.0000 mg | Freq: Once | INTRAMUSCULAR | Status: DC | PRN

## 2019-05-22 MED ORDER — ENOXAPARIN SODIUM 40 MG/0.4ML ~~LOC~~ SOLN
40.0000 mg | SUBCUTANEOUS | Status: DC
  Administered 2019-05-23: 40 mg via SUBCUTANEOUS
  Filled 2019-05-22 (×2): qty 0.4

## 2019-05-22 MED ORDER — METHOCARBAMOL 500 MG PO TABS: 500.0000 mg | ORAL_TABLET | Freq: Four times a day (QID) | ORAL | Status: DC | PRN

## 2019-05-22 MED ORDER — ONDANSETRON HCL 4 MG/2ML IJ SOLN
INTRAMUSCULAR | Status: DC | PRN
  Administered 2019-05-22: 4 mg via INTRAVENOUS

## 2019-05-22 MED ORDER — FENTANYL CITRATE (PF) 250 MCG/5ML IJ SOLN
INTRAMUSCULAR | Status: AC
  Filled 2019-05-22: qty 5

## 2019-05-22 MED ORDER — CEFAZOLIN SODIUM-DEXTROSE 2-4 GM/100ML-% IV SOLN
2.0000 g | Freq: Three times a day (TID) | INTRAVENOUS | Status: AC
  Administered 2019-05-22 – 2019-05-23 (×3): 2 g via INTRAVENOUS
  Filled 2019-05-22 (×3): qty 100

## 2019-05-22 MED ORDER — PHENYLEPHRINE 40 MCG/ML (10ML) SYRINGE FOR IV PUSH (FOR BLOOD PRESSURE SUPPORT)
PREFILLED_SYRINGE | INTRAVENOUS | Status: AC
  Filled 2019-05-22: qty 10

## 2019-05-22 MED ORDER — PHENYLEPHRINE HCL-NACL 10-0.9 MG/250ML-% IV SOLN
INTRAVENOUS | Status: DC | PRN
  Administered 2019-05-22: 50 ug/min via INTRAVENOUS

## 2019-05-22 MED ORDER — PROPOFOL 10 MG/ML IV BOLUS
INTRAVENOUS | Status: DC | PRN
  Administered 2019-05-22: 30 mg via INTRAVENOUS

## 2019-05-22 SURGICAL SUPPLY — 59 items
ADH SKN CLS LQ APL DERMABOND (GAUZE/BANDAGES/DRESSINGS) ×1
BIT DRILL 4.2 (DRILL) IMPLANT
BIT DRILL SHORT 4.2 (BIT) IMPLANT
BLADE SURG 10 STRL SS (BLADE) ×6 IMPLANT
BNDG COHESIVE 4X5 TAN STRL (GAUZE/BANDAGES/DRESSINGS) ×3 IMPLANT
BNDG COHESIVE 6X5 TAN STRL LF (GAUZE/BANDAGES/DRESSINGS) ×2 IMPLANT
BRUSH SCRUB EZ PLAIN DRY (MISCELLANEOUS) ×6 IMPLANT
COVER SURGICAL LIGHT HANDLE (MISCELLANEOUS) ×3 IMPLANT
DERMABOND ADHESIVE PROPEN (GAUZE/BANDAGES/DRESSINGS) ×2
DERMABOND ADVANCED .7 DNX6 (GAUZE/BANDAGES/DRESSINGS) IMPLANT
DRAPE C-ARM 35X43 STRL (DRAPES) ×3 IMPLANT
DRAPE C-ARMOR (DRAPES) ×3 IMPLANT
DRAPE HALF SHEET 40X57 (DRAPES) ×4 IMPLANT
DRAPE IMP U-DRAPE 54X76 (DRAPES) ×6 IMPLANT
DRAPE INCISE IOBAN 66X45 STRL (DRAPES) ×3 IMPLANT
DRAPE ORTHO SPLIT 77X108 STRL (DRAPES) ×6
DRAPE SURG 17X23 STRL (DRAPES) ×3 IMPLANT
DRAPE SURG ORHT 6 SPLT 77X108 (DRAPES) ×2 IMPLANT
DRAPE U-SHAPE 47X51 STRL (DRAPES) ×3 IMPLANT
DRILL 4.2 (DRILL) ×3
DRILL BIT SHORT 4.2 (BIT)
DRSG MEPILEX BORDER 4X4 (GAUZE/BANDAGES/DRESSINGS) ×7 IMPLANT
DRSG MEPILEX BORDER 4X8 (GAUZE/BANDAGES/DRESSINGS) ×3 IMPLANT
DRSG MEPITEL 3X4 ME34 (GAUZE/BANDAGES/DRESSINGS) ×2 IMPLANT
DRSG MEPITEL 4X7.2 (GAUZE/BANDAGES/DRESSINGS) ×2 IMPLANT
DURAPREP 26ML APPLICATOR (WOUND CARE) ×2 IMPLANT
DURAPREP 6ML APPLICATOR 50/CS (WOUND CARE) ×2 IMPLANT
ELECT REM PT RETURN 9FT ADLT (ELECTROSURGICAL) ×3
ELECTRODE REM PT RTRN 9FT ADLT (ELECTROSURGICAL) ×1 IMPLANT
GLOVE BIO SURGEON STRL SZ 6.5 (GLOVE) ×6 IMPLANT
GLOVE BIO SURGEON STRL SZ7.5 (GLOVE) ×9 IMPLANT
GLOVE BIO SURGEONS STRL SZ 6.5 (GLOVE) ×3
GLOVE BIOGEL PI IND STRL 6.5 (GLOVE) ×1 IMPLANT
GLOVE BIOGEL PI IND STRL 7.5 (GLOVE) ×1 IMPLANT
GLOVE BIOGEL PI INDICATOR 6.5 (GLOVE) ×2
GLOVE BIOGEL PI INDICATOR 7.5 (GLOVE) ×2
GOWN STRL REUS W/ TWL LRG LVL3 (GOWN DISPOSABLE) ×3 IMPLANT
GOWN STRL REUS W/ TWL XL LVL3 (GOWN DISPOSABLE) ×1 IMPLANT
GOWN STRL REUS W/TWL LRG LVL3 (GOWN DISPOSABLE) ×12
GOWN STRL REUS W/TWL XL LVL3 (GOWN DISPOSABLE) ×3
GUIDEWIRE 3.2X400 (WIRE) ×2 IMPLANT
KIT BASIN OR (CUSTOM PROCEDURE TRAY) ×3 IMPLANT
KIT TURNOVER KIT B (KITS) ×3 IMPLANT
NAIL CANN 10X360MM RT STERILE (Nail) ×2 IMPLANT
NS IRRIG 1000ML POUR BTL (IV SOLUTION) ×3 IMPLANT
PACK GENERAL/GYN (CUSTOM PROCEDURE TRAY) ×3 IMPLANT
PAD ARMBOARD 7.5X6 YLW CONV (MISCELLANEOUS) ×2 IMPLANT
REAMER ROD DEEP FLUTE 2.5X950 (INSTRUMENTS) ×2 IMPLANT
SCREW LOCK STAR 5X34 (Screw) ×2 IMPLANT
SCREW LOCK STAR 5X36 (Screw) ×2 IMPLANT
SCREW TFNA 90 (Screw) ×2 IMPLANT
STAPLER VISISTAT 35W (STAPLE) ×1 IMPLANT
STOCKINETTE IMPERVIOUS LG (DRAPES) ×3 IMPLANT
SUT ETHILON 3 0 PS 1 (SUTURE) ×1 IMPLANT
SUT MNCRL AB 3-0 PS2 18 (SUTURE) ×3 IMPLANT
SUT VIC AB 2-0 CT1 27 (SUTURE) ×3
SUT VIC AB 2-0 CT1 TAPERPNT 27 (SUTURE) ×2 IMPLANT
TOWEL GREEN STERILE FF (TOWEL DISPOSABLE) ×3 IMPLANT
UNDERPAD 30X30 (UNDERPADS AND DIAPERS) ×3 IMPLANT

## 2019-05-22 NOTE — Progress Notes (Signed)
PROGRESS NOTE    Chelsea Roberson  IOE:[id number] DOB: 06-20-1945 DOA: 05/21/2019 PCP: Lajean Manes, MD   Brief Narrative:  HPI On 05/21/2019 by Dr. Oretha Milch Chelsea Roberson is a 73 y.o. female with medical history significant for right intertrochanteric fracture status post intramedullary repair on 05/2018 with hardware removal Dr. Doreatha Slaubaugh on 12/2 due to persistent pain from right femur stress reaction, dementia, HTN, diabetes, thyroidism who presents on 05/21/2019 with right hip/leg pain after mechanical fall.  While walking to [facility] with her husband patient missed stepping onto a curb and fell backwards onto her husband.  They both fell on the ground.  Her head collided with her husband's head without any loss of consciousness.  She denied any prodromal symptoms, including no chest pain, no lightheadedness, no nausea or vomiting.    Patient had hardware removal on 05/14/2019 by Dr. Doreatha Meharg due to right femur stress reaction and persistent right leg pain.  Per husband her right leg pain has been greatly improved since that procedure prior to this fall.   Assessment & Plan   Periprosthetic right femoral fracture -Status post mechanical fall -Patient had right intertrochanteric femur fracture December 2019 and underwent cephalomedullary nailing.  Patient also had hardware removal by Dr. Doreatha Angeletti on 05/14/2019 due to persistent pain, stress fracture. -Orthopedics consulted and appreciated, plan for surgery today -Continue pain control, would avoid IV sedative medications given dementia  Essential hypertension -Uncontrolled, with SBP's in the 180s on admission.  Suspect this is likely due to pain -Continue Coreg and amlodipine -Hold losartan, HCTZ -BP better controlled today  Leukocytosis -Resolved, likely secondary to the above   Hypothyroid -Stable, continue Synthroid  Anxiety/insomnia -Continue BuSpar, trazodone  Dementia without behavioral disturbance -Currently patient  appears to be alert and oriented x4  Diabetes mellitus, type II -Hemoglobin A1c 6 -Hold home medication Invokana -Continue insulin sliding scale, CBG monitoring  Hyperlipidemia -Continue statin  GERD  -continue pepcid  DVT Prophylaxis  SCDs  Code Status: DNR  Family Communication: None at bedside  Disposition Plan: Admitted.  Pending surgery today.  Dispo to be determined  Consultants Orthopedic surgery  Procedures  None  Antibiotics   Anti-infectives (From admission, onward)   Start     Dose/Rate Route Frequency Ordered Stop   05/22/19 1102  vancomycin (VANCOCIN) powder       As needed 05/22/19 1103     05/22/19 1017  ceFAZolin (ANCEF) 2-4 GM/100ML-% IVPB    Note to Pharmacy: Grace Blight   : cabinet override      05/22/19 1017 05/22/19 2229      Subjective:   Chelsea Roberson seen and examined today.  Patient complains of pain in the right hip.  She is also concerned that her pure wick is not working correctly.  She denies chest pain or shortness of breath, abdominal pain, dizziness or headache.  Objective:   Vitals:   05/21/19 2229 05/22/19 0351 05/22/19 0730 05/22/19 0958  BP: (!) 165/77 (!) 147/84 (!) 148/66   Pulse: 81 89 78   Resp: 18 18 15    Temp: 98.3 F (36.8 C) 98 F (36.7 C) 98.3 F (36.8 C)   TempSrc: Oral Oral Oral   SpO2: 99% 100% 99%   Weight:    70.8 kg  Height:    5\' 6"  (1.676 m)    Intake/Output Summary (Last 24 hours) at 05/22/2019 1230 Last data filed at 05/22/2019 1207 Gross per 24 hour  Intake 800 ml  Output 300 ml  Net 500 ml   Filed Weights   05/22/19 0958  Weight: 70.8 kg    Exam  General: Well developed, well nourished, NAD, appears stated age  HEENT: NCAT, mucous membranes moist.   Cardiovascular: S1 S2 auscultated, RRR  Respiratory: Clear to auscultation bilaterally with equal chest rise  Abdomen: Soft, nontender, nondistended, + bowel sounds  Extremities: warm dry without cyanosis clubbing or edema of LLE.  RLE shortened, externally rotated.  Neuro: AAOx3, nonfocal  Psych: Normal affect and demeanor    Data Reviewed: I have personally reviewed following labs and imaging studies  CBC: Recent Labs  Lab 05/21/19 1545 05/22/19 0401  WBC 13.7* 9.6  NEUTROABS 12.1*  --   HGB 14.8 14.4  HCT 44.3 43.2  MCV 98.4 99.5  PLT 208 228   Basic Metabolic Panel: Recent Labs  Lab 05/21/19 1545 05/22/19 0401  NA 141 138  K 4.0 4.4  CL 105 101  CO2 23 24  GLUCOSE 166* 172*  BUN 12 14  CREATININE 0.59 0.60  CALCIUM 9.7 9.3   GFR: Estimated Creatinine Clearance: 58.6 mL/min (by C-G formula based on SCr of 0.6 mg/dL). Liver Function Tests: No results for input(s): AST, ALT, ALKPHOS, BILITOT, PROT, ALBUMIN in the last 168 hours. No results for input(s): LIPASE, AMYLASE in the last 168 hours. No results for input(s): AMMONIA in the last 168 hours. Coagulation Profile: Recent Labs  Lab 05/21/19 1545  INR 1.0   Cardiac Enzymes: No results for input(s): CKTOTAL, CKMB, CKMBINDEX, TROPONINI in the last 168 hours. BNP (last 3 results) No results for input(s): PROBNP in the last 8760 hours. HbA1C: No results for input(s): HGBA1C in the last 72 hours. CBG: Recent Labs  Lab 05/21/19 2234 05/22/19 0647 05/22/19 0937  GLUCAP 146* 188* 128*   Lipid Profile: No results for input(s): CHOL, HDL, LDLCALC, TRIG, CHOLHDL, LDLDIRECT in the last 72 hours. Thyroid Function Tests: No results for input(s): TSH, T4TOTAL, FREET4, T3FREE, THYROIDAB in the last 72 hours. Anemia Panel: No results for input(s): VITAMINB12, FOLATE, FERRITIN, TIBC, IRON, RETICCTPCT in the last 72 hours. Urine analysis: No results found for: COLORURINE, APPEARANCEUR, LABSPEC, PHURINE, GLUCOSEU, HGBUR, BILIRUBINUR, KETONESUR, PROTEINUR, UROBILINOGEN, NITRITE, LEUKOCYTESUR Sepsis Labs: @LABRCNTIP (procalcitonin:4,lacticidven:4)  ) Recent Results (from the past 240 hour(s))  SARS CORONAVIRUS 2 (TAT 6-24 HRS) Nasopharyngeal  Nasopharyngeal Swab     Status: None   Collection Time: 05/21/19  5:07 PM   Specimen: Nasopharyngeal Swab  Result Value Ref Range Status   SARS Coronavirus 2 NEGATIVE NEGATIVE Final    Comment: (NOTE) SARS-CoV-2 target nucleic acids are NOT DETECTED. The SARS-CoV-2 RNA is generally detectable in upper and lower respiratory specimens during the acute phase of infection. Negative results do not preclude SARS-CoV-2 infection, do not rule out co-infections with other pathogens, and should not be used as the sole basis for treatment or other patient management decisions. Negative results must be combined with clinical observations, patient history, and epidemiological information. The expected result is Negative. Fact Sheet for Patients: HairSlick.nohttps://www.fda.gov/media/138098/download Fact Sheet for Healthcare Providers: quierodirigir.comhttps://www.fda.gov/media/138095/download This test is not yet approved or cleared by the [place] FDA and  has been authorized for detection and/or diagnosis of SARS-CoV-2 by FDA under an Emergency Use Authorization (EUA). This EUA will remain  in effect (meaning this test can be used) for the duration of the COVID-19 declaration under Section 56 4(b)(1) of the Act, 21 U.S.C. section 360bbb-3(b)(1), unless the authorization is terminated or revoked sooner. Performed at [facility],  [address] [place], [place] [address]   Surgical pcr screen     Status: None   Collection Time: 05/21/19 11:15 PM   Specimen: Nasal Mucosa; Nasal Swab  Result Value Ref Range Status   MRSA, PCR NEGATIVE NEGATIVE Final   Staphylococcus aureus NEGATIVE NEGATIVE Final    Comment: (NOTE) The Xpert SA Assay (FDA approved for NASAL specimens in patients 43 years of age and older), is one component of a comprehensive surveillance program. It is not intended to diagnose infection nor to guide or monitor treatment. Performed at [facility], [address], [place], [place]  [address]       Radiology Studies: DG Pelvis 1-2 Views  Result Date: 05/21/2019 CLINICAL DATA:  Fall from standing EXAM: PELVIS - 1-2 VIEW COMPARISON:  May 15, 2018 FINDINGS: The patient is status post intramedullary rod and nail fixation of the right femur. There is partially visualized proximal right femoral periprosthetic fracture. 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 There is diffuse osteopenia. Degenerative changes in the lumbar spine. IMPRESSION: Partially visualized right proximal periprosthetic femur fracture. Electronically Signed   By: Jonna Clark M.D.   On: 05/21/2019 15:08   DG Knee Complete 4 Views Right  Result Date: 05/21/2019 CLINICAL DATA:  Knee pain, fall from standing EXAM: RIGHT KNEE - COMPLETE 4+ VIEW COMPARISON:  None. FINDINGS: There is a partially visualized femoral fracture. No definite fracture seen in the knee. There is diffuse osteopenia. No knee joint effusion is seen. IMPRESSION: Partially visualized proximal femoral fracture. Electronically Signed   By: Jonna Clark M.D.   On: 05/21/2019 15:10   DG Femur Min 2 Views Right  Result Date: 05/21/2019 CLINICAL DATA:  Fall, right femur pain and deformity, recent surgery to remove hardware EXAM: RIGHT FEMUR 2 VIEWS COMPARISON:  Femur radiograph 05/14/2019 FINDINGS: There is a periprosthetic right femoral fracture with foreshortening of approximately 12 cm and lateral displacement of 1 shaft width. Fracture line appears to originate at the lateral lucent screw tract from the recently removed hardware previously securing the distal extent of a short segment right femoral intramedullary nail. No other femur fracture is identified. Femoral head remains normally located with a transcervical fixation screw. Extensive soft tissue swelling is noted. IMPRESSION: Periprosthetic right femoral fracture with foreshortening and lateral displacement. Fracture line extends from the lateral lucent screw track at the site of  recently removed screw. Extension obliquely through the diaphysis at the terminus of the short segment intramedullary nail. Electronically Signed   By: Kreg Shropshire M.D.   On: 05/21/2019 15:08     Scheduled Meds: . [MAR Hold] amLODipine  2.5 mg Oral Daily  . [MAR Hold] busPIRone  15 mg Oral QHS  . [MAR Hold] busPIRone  7.5 mg Oral Daily  . [MAR Hold] carvedilol  3.125 mg Oral BID WC  . [MAR Hold] docusate sodium  100 mg Oral Daily  . [MAR Hold] famotidine  20 mg Oral BID  . [MAR Hold] insulin aspart  0-9 Units Subcutaneous TID WC  . [MAR Hold] levothyroxine  50 mcg Oral Q0600  . [MAR Hold] simvastatin  20 mg Oral QHS  . [MAR Hold] traZODone  25 mg Oral QHS   Continuous Infusions: . ceFAZolin    . lactated ringers 10 mL/hr at 05/22/19 1005     LOS: 1 day   Time Spent in minutes   45 minutes  [employer] D.O. on 05/22/2019 at 12:30 PM  Between 7am to 7pm - Please  see pager noted on [email or web address]  After 7pm go to [email or web address]  And look for the night coverage person covering for me after hours  [facility] Office  [phone]

## 2019-05-22 NOTE — Anesthesia Preprocedure Evaluation (Signed)
Anesthesia Evaluation  Patient identified by MRN, date of birth, ID band Patient awake    Reviewed: Allergy & Precautions, NPO status , Patient's Chart, lab work & pertinent test results  History of Anesthesia Complications (+) PONV  Airway Mallampati: I  TM Distance: >3 FB Neck ROM: Full    Dental   Pulmonary sleep apnea ,    Pulmonary exam normal        Cardiovascular hypertension, Pt. on medications Normal cardiovascular exam     Neuro/Psych Anxiety Dementia    GI/Hepatic GERD  Medicated and Controlled,  Endo/Other  diabetes, Type 2, Oral Hypoglycemic Agents  Renal/GU      Musculoskeletal   Abdominal   Peds  Hematology   Anesthesia Other Findings   Reproductive/Obstetrics                             Anesthesia Physical Anesthesia Plan  ASA: III  Anesthesia Plan: Spinal   Post-op Pain Management:    Induction: Intravenous  PONV Risk Score and Plan: 3 and Ondansetron and Treatment may vary due to age or medical condition  Airway Management Planned: Nasal Cannula  Additional Equipment:   Intra-op Plan:   Post-operative Plan:   Informed Consent: I have reviewed the patients History and Physical, chart, labs and discussed the procedure including the risks, benefits and alternatives for the proposed anesthesia with the patient or authorized representative who has indicated his/her understanding and acceptance.       Plan Discussed with: CRNA and Surgeon  Anesthesia Plan Comments:         Anesthesia Quick Evaluation

## 2019-05-22 NOTE — Interval H&P Note (Signed)
History and Physical Interval Note:  05/22/2019 9:58 AM  Chelsea Roberson  has presented today for surgery, with the diagnosis of Periprosthetic femoral shaft fracture.  The various methods of treatment have been discussed with the patient and family. After consideration of risks, benefits and other options for treatment, the patient has consented to  Procedure(s): REMOVAL OF HARDWARE WITH INTRAMEDULLARY (IM) NAIL FEMORAL (Right) as a surgical intervention.  The patient's history has been reviewed, patient examined, no change in status, stable for surgery.  I have reviewed the patient's chart and labs.  Questions were answered to the patient's satisfaction.     Lennette Bihari P Arnoldo Hildreth

## 2019-05-22 NOTE — Progress Notes (Signed)
Initial Nutrition Assessment  DOCUMENTATION CODES:   Not applicable  INTERVENTION:  -Ensure Enlive po BID, each supplement provides 350 kcal and 20 grams of protein  -1 packet Juven BID, each packet provides 95 calories, 2.5 grams of protein (collagen), and 9.8 grams of carbohydrate (3 grams sugar); also contains 7 grams of L-arginine and L-glutamine, 300 mg vitamin C, 15 mg vitamin E, 1.2 mcg vitamin B-12, 9.5 mg zinc, 200 mg calcium, and 1.5 g  Calcium Beta-hydroxy-Beta-methylbutyrate to support wound healing  -MVI with minerals daily   NUTRITION DIAGNOSIS:   Increased nutrient needs related to wound healing(12/10 intramedullary nailing of right femure fracture) as evidenced by estimated needs.  GOAL:   Patient will meet greater than or equal to 90% of their needs   MONITOR:   PO intake, Supplement acceptance, Skin, I & O's, Labs, Weight trends  REASON FOR ASSESSMENT:   Malnutrition Screening Tool    ASSESSMENT:  RD working remotely.  73 year old female with medical history significant for right intertrochanteric fracture s/p intramedullary repair on 05/2018 with hardware removal on 12/2 due to persistent pain from right femur stress, dementia with memory loss, HTN, T2DM, hypothyroidism, HTN, and GERD. Patient presented to ED with right hip/leg pain after mechanical fall. X-ray of femur showed periprosthetic right femoral fracture with lateral displacement.  Patient is s/p intramedullary nailing of femur fracture 12/10  Patient with recent CM diet advancement after surgery, no po intake recorded at this time. Will continue to monitor for po intake of meals. RD to provide Ensure to aid with calorie/protein needs and Juven to support wound healing.   I/Os: +450 ml since admit  Current wt 70.8 kg (155.8 lb) non-pitting RLE edema per review of RN assessment. No recent wt history available for review, last recorded wt prior to admission 59 kg (129.8 lb) recorded in December  2019; noted 26 lb wt gain over the past year.  05/14/19 70.8 kg  05/15/18 59 kg  05/09/16 59 kg    Medications reviewed and include: Colace 100 mg daily, Pepcid 20 mg BID, Gabapentin 100 mg TID, SSI with meals  Labs: CBGs 125-188 x 24 hrs  NUTRITION - FOCUSED PHYSICAL EXAM: Unable to complete at this time, RD working remotely.    Diet Order:   Diet Order            Diet Carb Modified Fluid consistency: Thin; Room service appropriate? Yes  Diet effective now              EDUCATION NEEDS:   No education needs have been identified at this time  Skin:  Skin Assessment: Reviewed RN Assessment(incision;closed; right leg)  Last BM:  PTA  Height:   Ht Readings from Last 1 Encounters:  05/22/19 5\' 6"  (1.676 m)    Weight:   Wt Readings from Last 1 Encounters:  05/22/19 70.8 kg    Ideal Body Weight:  59.1 kg  BMI:  Body mass index is 25.19 kg/m.  Estimated Nutritional Needs:   Kcal:  1600-1800  Protein:  99-106  Fluid:  >/= 1.6 L/day  Lajuan Lines, RD, LDN Clinical Nutrition Office [phone] After Hours/Weekend Pager: [phone]

## 2019-05-22 NOTE — Progress Notes (Addendum)
Pt came up to the floor around 2215. Pt stable. BP potentially elevated d/t pain and moving onto bed. Pain managed with one dose morphine. Pt sleeping with no c/o pain. Will continue to monitor. PCR completed,EKG completed, CHGx1, consents filled out and waiting for signature.

## 2019-05-22 NOTE — Transfer of Care (Signed)
Immediate Anesthesia Transfer of Care Note  Patient: Chelsea Roberson  Procedure(s) Performed: REMOVAL OF HARDWARE WITH INTRAMEDULLARY (IM) NAIL FEMORAL (Right )  Patient Location: PACU  Anesthesia Type:Spinal  Level of Consciousness: awake and alert   Airway & Oxygen Therapy: Patient Spontanous Breathing and Patient connected to nasal cannula oxygen  Post-op Assessment: Report given to RN, Post -op Vital signs reviewed and stable and Patient moving all extremities  Post vital signs: Reviewed and stable  Last Vitals:  Vitals Value Taken Time  BP 101/76 05/22/19 1238  Temp    Pulse    Resp 18 05/22/19 1240  SpO2    Vitals shown include unvalidated device data.  Last Pain:  Vitals:   05/22/19 0923  TempSrc:   PainSc: 7       Patients Stated Pain Goal: 3 (23/53/61 4431)  Complications: No apparent anesthesia complications

## 2019-05-22 NOTE — Plan of Care (Signed)

## 2019-05-22 NOTE — Op Note (Signed)
Orthopaedic Surgery Operative Note (CSN: [id number] ) Date of Surgery: 05/22/2019  Admit Date: 05/21/2019   Diagnoses: Pre-Op Diagnoses: Right periprosthetic femoral shaft fracture  Post-Op Diagnosis: Same  Procedures: 1. CPT 20680-Removal of hardware right femur 2. CPT 27506-Intramedullary nailing of right femur fracture  Surgeons : Primary: Sherica Paternostro, Thomasene Lot, MD  Assistant: Patrecia Pace, PA-C  Location: OR 7   Anesthesia:General  Antibiotics: Ancef 2g preop   Tourniquet time:None  Estimated Blood ATFT:732 mL  Complications:None   Specimens:None   Implants: Implant Name Type Inv. Item Serial No. Manufacturer Lot No. LRB No. Used Action  NAIL CANN 10X360MM RT STERILE - [id number] Nail NAIL CANN 10X360MM RT STERILE  [employer] [id number] Right 1 Implanted  SCREW TFNA 90 - [id number] Screw SCREW TFNA 90  [employer]  Right 1 Implanted  SCREW LOCK STAR 5X34 - [id number] Screw SCREW LOCK STAR 5X34  [employer]  Right 1 Implanted  SCREW LOCK STAR 5X36 - [id number] Screw SCREW LOCK STAR 5X36  [employer]  Right 1 Implanted     Indications for Surgery: 74 year old female who had a intertrochanteric femur fracture in December 2019 that I fixed with a cephalomedullary device.  She had thigh pain with weightbearing following a uneventful healing of her fracture.  She had a stress reaction around the distal portion of her nail.  I took her to surgery to remove the distal interlock.  Unfortunately she sustained a fall and a periprosthetic fracture around the nail.  I felt that she was indicated for removal of hardware and intramedullary nailing of her right femur fracture.  Risks and benefits were discussed with the patient's husband.  Risks include but not limited to bleeding, infection, malunion, nonunion, hardware failure, hardware irritation, nerve and blood vessel injury, periprosthetic fracture, DVT, even the possibility of anesthetic complications.  Patient agreed to proceed  with surgery and consent was obtained.  Operative Findings: 1.  Removal of previous short Synthes TFN without significant difficulty 2.  Cephalomedullary nailing of right femoral shaft fracture using Synthes 360 mm x 10 mm TFNA with 90 mm lag screw  Procedure: The patient was identified in the preoperative holding area. Consent was confirmed with the patient and their family and all questions were answered. The operative extremity was marked after confirmation with the patient. she was then brought back to the operating room by our anesthesia colleagues.  She had a spinal anesthetic placed.  She was carefully transferred over to a radiolucent flat top table.  The patient was placed under sedation the right lower extremity was prepped and draped in usual sterile fashion.  Timeout was performed to verify the patient procedure and extremity.  I obtain fluoroscopic imaging to show the unstable nature of her injury.  I made a percutaneous incision where her previous helical blade was placed.  I reverse threaded the extractor into the helical blade.  I then made an incision over the previous insertion spot of the nail.  I split the gluteus musculature in line with my incision and then proceeded to thread the extractor bolt for the actual nail portion of the device.  I then used the flexible screwdriver to undo the setscrew.  The helical blade was removed and then the intramedullary device was removed as well.  I then placed a ball-tipped guidewire down the center of the canal.  Incision was made at the location of the fracture to assist with reduction.  I passed a ball-tipped guidewire down the center canal ending into the  metaphysis.  I measured and chose a 360 mm nail.  With the fracture reduced I sequentially reamed from 47mm to 11 mm.  I then passed a 360 mm 10 mm nail down the center of the canal and seated it until the nail was in the position the short nail was before.  Through the previous helical  blade incision I placed a threaded guidepin into the same path.  I then placed a 90 mm lag screw into the head neck segment obtaining excellent purchase.  The setscrew was tightened.  The outer jig was removed.  I then performed perfect circle technique to place 2 distal interlocking screws from lateral to medial.  Final fluoroscopic images were obtained.  The incision was copiously irrigated.  Vancomycin powder was placed into the incision.  The skin was closed with 2-0 Vicryl and 3-0 Monocryl and sealed with Dermabond.  Mepilex dressings were placed to the incisions.  The patient was awoken from anesthesia and taken to the PACU in stable condition.  Post Op Plan/Instructions: The patient will be weightbearing as tolerated to the right lower extremity.  She will receive Ancef for postoperative surgical prophylaxis.  She will be placed on Lovenox for DVT prophylaxis.  We will mobilize her with physical and Occupational Therapy.  I was present and performed the entire surgery.  Ulyses Southward, PA-C did assist me throughout the case. An assistant was necessary given the difficulty in approach, maintenance of reduction and ability to instrument the fracture.   Truitt Merle, MD [facility]

## 2019-05-22 NOTE — Evaluation (Signed)
Physical Therapy Evaluation Patient Details Name: Chelsea Roberson MRN: [id number] DOB: Sep 05, 1945 Today's Date: 05/22/2019   History of Present Illness  73yo female with recent history of distal interlock of cephalomedullary nailing due to stress reaction of femur, now s/p mechanical fall resulting in periprosthetic femur fracture. S/p removal of hardware with IM nail 05/22/19. PMH memory loss with dementia, HTN, HLD, DM, wrist surgery, femur IM nail 2019  Clinical Impression   Patient received in bed, pleasantly confused but willing to work with therapy; does have definite STM deficit and required cues for safety throughout session. Husband present and observed session/assisted in motivating patient. Able to complete bed mobility with ModA due to RLE pain, functional transfers with RW and MinA, gait approximately 91f with RW and min guard with Mod cues for navigation in room and navigation of device. She was left in bed with all needs met, bed alarm active and husband present. Currently recommending skilled HHPT services moving forward.     Follow Up Recommendations Home health PT;Supervision for mobility/OOB    Equipment Recommendations  Rolling walker with 5" wheels;3in1 (PT)    Recommendations for Other Services       Precautions / Restrictions Precautions Precautions: Fall Restrictions Weight Bearing Restrictions: No RLE Weight Bearing: Weight bearing as tolerated      Mobility  Bed Mobility Overal bed mobility: Needs Assistance Bed Mobility: Supine to Sit;Sit to Supine     Supine to sit: Mod assist Sit to supine: Mod assist   General bed mobility comments: ModA for LE management and smoothness of movement to avoid pain  Transfers Overall transfer level: Needs assistance Equipment used: Rolling walker (2 wheeled) Transfers: Sit to/from Stand Sit to Stand: Min assist         General transfer comment: MinA to boost to full standing position, cues for hand  placement, patient a bit impulsive and eager to get up  Ambulation/Gait Ambulation/Gait assistance: Min guard Gait Distance (Feet): 20 Feet Assistive device: Rolling walker (2 wheeled) Gait Pattern/deviations: Step-to pattern;Decreased step length - left;Decreased stance time - right;Decreased weight shift to right;Antalgic Gait velocity: decreased   General Gait Details: antalgic gait pattern but good technique with RW, slow but steady, easily fatigued  Stairs            Wheelchair Mobility    Modified Rankin (Stroke Patients Only)       Balance Overall balance assessment: Needs assistance Sitting-balance support: Bilateral upper extremity supported;Feet supported Sitting balance-Leahy Scale: Good     Standing balance support: Single extremity supported;Bilateral upper extremity supported Standing balance-Leahy Scale: Poor Standing balance comment: reliant on external support                             Pertinent Vitals/Pain Pain Assessment: Faces Faces Pain Scale: Hurts even more Pain Location: R LE with motion Pain Descriptors / Indicators: Aching;Sore Pain Intervention(s): Limited activity within patient's tolerance;Monitored during session;Premedicated before session    [place] residence Living Arrangements: Spouse/significant other Available Help at Discharge: Family;Available 24 hours/day Type of Home: House Home Access: Stairs to enter Entrance Stairs-Rails: Can reach both Entrance Stairs-Number of Steps: 3 Home Layout: One level Home Equipment: Walker - 2 wheels;Cane - single point      Prior Function Level of Independence: Independent with assistive device(s)         Comments: ambulates with use of a cane  Hand Dominance        Extremity/Trunk Assessment   Upper Extremity Assessment Upper Extremity Assessment: Generalized weakness    Lower Extremity  Assessment Lower Extremity Assessment: Generalized weakness    Cervical / Trunk Assessment Cervical / Trunk Assessment: Kyphotic  Communication   Communication: No difficulties  Cognition Arousal/Alertness: Awake/alert Behavior During Therapy: Anxious;Impulsive Overall Cognitive Status: History of cognitive impairments - at baseline Area of Impairment: Attention;Memory;Following commands;Safety/judgement;Problem solving                   Current Attention Level: Sustained Memory: Decreased short-term memory;Decreased recall of precautions Following Commands: Follows one step commands with increased time;Follows one step commands consistently Safety/Judgement: Decreased awareness of safety;Decreased awareness of deficits   Problem Solving: Difficulty sequencing;Requires verbal cues General Comments: anxious with mobilty, often trying to jump ahead to walking before therapist was ready      General Comments General comments (skin integrity, edema, etc.): VSS    Exercises     Assessment/Plan    PT Assessment Patient needs continued PT services  PT Problem List Decreased strength;Decreased knowledge of use of DME;Decreased activity tolerance;Decreased safety awareness;Decreased balance;Decreased knowledge of precautions;Pain;Decreased mobility;Decreased coordination       PT Treatment Interventions DME instruction;Balance training;Gait training;Neuromuscular re-education;Stair training;Functional mobility training;Patient/family education;Therapeutic activities;Therapeutic exercise    PT Goals (Current goals can be found in the Care Plan section)  Acute Rehab PT Goals Patient Stated Goal: go home as soon as possible PT Goal Formulation: With patient/family Time For Goal Achievement: 06/05/19 Potential to Achieve Goals: Good    Frequency Min 5X/week   Barriers to discharge        Co-evaluation               AM-PAC PT "6 Clicks" Mobility  Outcome Measure  Help needed turning from your back to your side while in a flat bed without using bedrails?: A Little Help needed moving from lying on your back to sitting on the side of a flat bed without using bedrails?: A Lot Help needed moving to and from a bed to a chair (including a wheelchair)?: A Little Help needed standing up from a chair using your arms (e.g., wheelchair or bedside chair)?: A Little Help needed to walk in hospital room?: A Little Help needed climbing 3-5 steps with a railing? : A Lot 6 Click Score: 16    End of Session Equipment Utilized During Treatment: Gait belt Activity Tolerance: Patient tolerated treatment well Patient left: in bed;with call bell/phone within reach;with bed alarm set;with family/visitor present Nurse Communication: Mobility status PT Visit Diagnosis: Difficulty in walking, not elsewhere classified (R26.2);Unsteadiness on feet (R26.81);Muscle weakness (generalized) (M62.81);Pain Pain - Right/Left: Right Pain - part of body: Leg    Time: 6945-0388 PT Time Calculation (min) (ACUTE ONLY): 29 min   Charges:   PT Evaluation $PT Eval Moderate Complexity: 1 Mod PT Treatments $Gait Training: 8-22 mins        Windell Norfolk, DPT, PN1   Supplemental Physical Therapist [place]    Pager [phone] Acute Rehab Office [phone]

## 2019-05-22 NOTE — Plan of Care (Signed)
  Problem: Activity: Goal: Risk for activity intolerance will decrease Outcome: Progressing   Problem: Clinical Measurements: Goal: Ability to maintain clinical measurements within normal limits will improve Outcome: Progressing   Problem: Nutrition: Goal: Adequate nutrition will be maintained Outcome: Progressing   Problem: Coping: Goal: Level of anxiety will decrease Outcome: Progressing   Problem: Elimination: Goal: Will not experience complications related to bowel motility Outcome: Progressing   Problem: Pain Managment: Goal: General experience of comfort will improve Outcome: Progressing   Problem: Safety: Goal: Ability to remain free from injury will improve Outcome: Progressing   Problem: Skin Integrity: Goal: Risk for impaired skin integrity will decrease Outcome: Progressing   

## 2019-05-22 NOTE — Anesthesia Procedure Notes (Signed)
Spinal  Start time: 05/22/2019 10:32 AM End time: 05/22/2019 10:35 AM Staffing Performed: anesthesiologist  Anesthesiologist: Lillia Abed, MD Preanesthetic Checklist Completed: patient identified, IV checked, risks and benefits discussed, surgical consent, monitors and equipment checked, pre-op evaluation and timeout performed Spinal Block Patient position: right lateral decubitus Prep: DuraPrep Patient monitoring: heart rate, cardiac monitor, continuous pulse ox and blood pressure Approach: right paramedian Location: L3-4 Injection technique: single-shot Needle Needle type: Pencan  Needle gauge: 24 G Needle length: 9 cm Needle insertion depth: 6 cm

## 2019-05-22 NOTE — Anesthesia Postprocedure Evaluation (Signed)
Anesthesia Post Note  Patient: Chelsea Roberson  Procedure(s) Performed: REMOVAL OF HARDWARE WITH INTRAMEDULLARY (IM) NAIL FEMORAL (Right )     Patient location during evaluation: PACU Anesthesia Type: Spinal Level of consciousness: oriented and awake and alert Pain management: pain level controlled Vital Signs Assessment: post-procedure vital signs reviewed and stable Respiratory status: spontaneous breathing, respiratory function stable and patient connected to nasal cannula oxygen Cardiovascular status: blood pressure returned to baseline and stable Postop Assessment: no headache, no backache and no apparent nausea or vomiting Anesthetic complications: no    Last Vitals:  Vitals:   05/22/19 1400 05/22/19 1411  BP:  133/62  Pulse:  66  Resp:  16  Temp: 36.4 C 36.5 C  SpO2:  99%    Last Pain:  Vitals:   05/22/19 1411  TempSrc: Oral  PainSc:                  [name] DAVID

## 2019-05-23 ENCOUNTER — Encounter: Payer: Self-pay | Admitting: *Deleted

## 2019-05-23 LAB — GLUCOSE, CAPILLARY
Glucose-Capillary: 180 mg/dL — ABNORMAL HIGH (ref 70–99)
Glucose-Capillary: 238 mg/dL — ABNORMAL HIGH (ref 70–99)
Glucose-Capillary: 299 mg/dL — ABNORMAL HIGH (ref 70–99)

## 2019-05-23 LAB — CBC
HCT: 32.9 % — ABNORMAL LOW (ref 36.0–46.0)
Hemoglobin: 11.1 g/dL — ABNORMAL LOW (ref 12.0–15.0)
MCH: 33.1 pg (ref 26.0–34.0)
MCHC: 33.7 g/dL (ref 30.0–36.0)
MCV: 98.2 fL (ref 80.0–100.0)
Platelets: 170 10*3/uL (ref 150–400)
RBC: 3.35 MIL/uL — ABNORMAL LOW (ref 3.87–5.11)
RDW: 14.1 % (ref 11.5–15.5)
WBC: 7.1 10*3/uL (ref 4.0–10.5)
nRBC: 0 % (ref 0.0–0.2)

## 2019-05-23 LAB — BASIC METABOLIC PANEL
Anion gap: 9 (ref 5–15)
BUN: 12 mg/dL (ref 8–23)
CO2: 23 mmol/L (ref 22–32)
Calcium: 8.2 mg/dL — ABNORMAL LOW (ref 8.9–10.3)
Chloride: 105 mmol/L (ref 98–111)
Creatinine, Ser: 0.5 mg/dL (ref 0.44–1.00)
GFR calc Af Amer: >60 mL/min (ref >60)
GFR calc non Af Amer: >60 mL/min (ref >60)
Glucose, Bld: 155 mg/dL — ABNORMAL HIGH (ref 70–99)
Potassium: 3.2 mmol/L — ABNORMAL LOW (ref 3.5–5.1)
Sodium: 137 mmol/L (ref 135–145)

## 2019-05-23 LAB — VITAMIN D 25 HYDROXY (VIT D DEFICIENCY, FRACTURES): Vit D, 25-Hydroxy: 46.48 ng/mL (ref 30–100)

## 2019-05-23 MED ORDER — JUVEN PO PACK: 1.0000 | PACK | Freq: Two times a day (BID) | ORAL | 0 refills | Status: DC

## 2019-05-23 MED ORDER — POTASSIUM CHLORIDE CRYS ER 20 MEQ PO TBCR
40.0000 meq | EXTENDED_RELEASE_TABLET | Freq: Once | ORAL | Status: AC
  Administered 2019-05-23: 40 meq via ORAL
  Filled 2019-05-23: qty 2

## 2019-05-23 MED ORDER — ENOXAPARIN SODIUM 40 MG/0.4ML ~~LOC~~ SOLN: 40.0000 mg | SUBCUTANEOUS | 0 refills | Status: DC

## 2019-05-23 MED ORDER — TRAMADOL HCL 50 MG PO TABS: 50.0000 mg | ORAL_TABLET | Freq: Four times a day (QID) | ORAL | 0 refills | Status: DC | PRN

## 2019-05-23 NOTE — Discharge Summary (Signed)
Physician Discharge Summary  [name] ZOX:[id number] DOB: [birth year] DOA: 05/21/2019  PCP: Merlene LaughterStoneking, Hal, MD  Admit date: 05/21/2019 Discharge date: 05/23/2019  Time spent: 45 minutes  Recommendations for Outpatient Follow-up:  Patient will be discharged to home with home health, physical therapy.  Patient will need to follow up with primary care provider within one week of discharge, repeat BMP.  Follow up with orthopedics.  Patient should continue medications as prescribed.  Patient should follow a carb modified diet.   Discharge Diagnoses:  Periprosthetic right femoral fracture Essential hypertension Leukocytosis Hypothyroid Anxiety/insomnia Dementia without behavioral disturbance Diabetes mellitus, type II Hyperlipidemia GERD  Hypokalemia  Discharge Condition: stable  Diet recommendation: carb modified   Filed Weights   05/22/19 0958  Weight: 70.8 kg    History of present illness:  On 05/21/2019 by Dr. Caesar BookmanShayla Roberson Chelsea Roberson a 73 y.o.femalewith medical history significant forright intertrochanteric fracture status post intramedullary repair on 05/2018 with hardware removalDr. Haddixon 12/2 due topersistent pain fromright femur stress reaction, dementia, HTN, diabetes, thyroidismwho presents on 12/9/2020with right hip/leg pain aftermechanical fall.While walking to [facility] with her husband patient missedstepping onto a curband fell backwards onto her husband. They both fell on the ground.Her head collided with her husband's head without any loss of consciousness.She denied any prodromal symptoms, including no chest pain, no lightheadedness, no nausea or vomiting.   Patient had hardware removal on 05/14/2019 by Dr. Jena GaussHaddix due to right femur stress reaction and persistent right leg pain. Per husband her right leg pain has been greatly improved since that procedure prior to this fall.  Hospital Course:  Periprosthetic right femoral  fracture -Status post mechanical fall -Patient had right intertrochanteric femur fracture December 2019 and underwent cephalomedullary nailing.  Patient also had hardware removal by Dr. Jena GaussHaddix on 05/14/2019 due to persistent pain, stress fracture. -Orthopedics consulted and appreciated, s/p surgery: Removal of hardware right femur, intramedullary narrowing of right femur fracture -PT recommended HH PT, rolling walker with 5" wheels, 3 and 1  Essential hypertension -Uncontrolled, with SBP's in the 180s on admission.  Suspect this is likely due to pain -Continue Coreg and amlodipine -Hold losartan, HCTZ- may resume on discharge -BP better controlled today  Leukocytosis -Resolved, likely secondary to the above   Hypothyroid -Stable, continue Synthroid  Anxiety/insomnia -Continue BuSpar, trazodone  Dementia without behavioral disturbance -Currently patient appears to be alert and oriented -intermittent confusion  Diabetes mellitus, type II -Hemoglobin A1c 6 -Hold home medication Invokana- restart on discharge  Hyperlipidemia -Continue statin  GERD  -continue pepcid  Hypokalemia -replaced, repeat BMP in one week  Consultants Orthopedic surgery  Procedures  1. CPT 20680-Removal of hardware right femur 2. CPT 27506-Intramedullary nailing of right femur fracture   Discharge Exam: Vitals:   05/23/19 0404 05/23/19 0838  BP: (!) 123/56 (!) 149/56  Pulse: 87 90  Resp: 16 16  Temp: 99 F (37.2 C) 98.9 F (37.2 C)  SpO2: 96% 99%     General: Well developed, well nourished, NAD, appears stated age  HEENT: NCAT, mucous membranes moist.  Cardiovascular: S1 S2 auscultated, RRR  Respiratory: Clear to auscultation bilaterally   Abdomen: Soft, nontender, nondistended, + bowel sounds  Extremities: warm dry without cyanosis clubbing or edema. Dressing in place  Neuro: AAOx3, nonfocal  Psych: Normal affect and demeanor   Discharge Instructions Discharge  Instructions    Discharge instructions   Complete by: As directed    Patient will be discharged to home with home health, physical therapy.  Patient will need to follow up with primary care provider within one week of discharge, repeat BMP.  Follow up with orthopedics.  Patient should continue medications as prescribed.  Patient should follow a carb modified diet.     Allergies as of 05/23/2019      Reactions   Ciprofloxacin Other (See Comments)   HEADACHE    Sulfa Antibiotics Nausea Only      Medication List    TAKE these medications   acetaminophen 650 MG CR tablet Commonly known as: TYLENOL Take 1,300-1,950 mg by mouth every 8 (eight) hours as needed for pain.   alendronate 70 MG tablet Commonly known as: FOSAMAX Take 70 mg by mouth every /11/20 1255  05/23/19 1256  For home use only DME Walker rolling  [facility])  Once    Question:  Patient needs a walker to treat with the following condition  Answer:  Hip fracture (HCC)   05/23/19 1255   05/22/19 1703  For home use only DME 3 n 1  Once     05/22/19 1704   05/22/19 1703  For home use only DME Walker rolling  Once    Question:  Patient needs a walker to treat with the following condition  Answer:  Displaced fracture of shaft of right femur (HCC)   05/22/19 1704         Allergies  Allergen Reactions  . Ciprofloxacin Other (See Comments)    HEADACHE   . Sulfa Antibiotics Nausea Only   Follow-up  Information    Haddix, Gillie Manners, MD. Schedule an appointment as soon as possible for a visit in 2 week(s).   Specialty: Orthopedic Surgery Why: Repeat x-rays, wound check Contact information: [address] [place] [address] [phone]        Merlene Laughter, MD. Schedule an appointment as soon as possible for a visit in 1 week(s).   Specialty: Internal Medicine Why: hospital follow up Contact information: [address] [place] [address] [phone]            The results of significant diagnostics from this hospitalization (including imaging, microbiology, ancillary and laboratory) are listed below for reference.    Significant Diagnostic Studies: DG Pelvis 1-2 Views  Result Date: 05/21/2019 CLINICAL DATA:  Fall from standing EXAM: PELVIS - 1-2 VIEW COMPARISON:  May 15, 2018 FINDINGS: The patient is status post intramedullary rod and nail fixation of the right femur. There is partially visualized proximal right femoral periprosthetic fracture. 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 There is diffuse osteopenia. Degenerative changes in the lumbar spine. IMPRESSION: Partially visualized right proximal periprosthetic femur fracture. Electronically Signed   By: Jonna Clark M.D.   On: 05/21/2019 15:08   DG Knee Complete 4 Views Right  Result Date: 05/21/2019 CLINICAL DATA:  Knee pain, fall from standing EXAM: RIGHT KNEE - COMPLETE 4+ VIEW COMPARISON:  None. FINDINGS: There is a partially visualized femoral fracture. No definite fracture seen in the knee. There is diffuse osteopenia. No knee joint effusion is seen. IMPRESSION: Partially visualized proximal femoral fracture. Electronically Signed   By: Jonna Clark M.D.   On: 05/21/2019 15:10   DG C-Arm 1-60 Min  Result Date: 05/22/2019 CLINICAL DATA:  Hardware removal. EXAM: DG C-ARM 1-60 MIN; RIGHT FEMUR 2 VIEWS CONTRAST:  None. FLUOROSCOPY TIME:  Fluoroscopy Time:  3 minutes 12 seconds  Radiation Exposure Index (if provided by the fluoroscopic device): None known. Number of Acquired Spot Images: 7 COMPARISON:  05/21/2019 FINDINGS: Several spot intraoperative images over the right femur demonstrate evidence of patient's known proximal to mid diaphyseal femoral fracture adjacent the distal aspect of the intramedullary nail. Subsequent images demonstrate placement of a new intramedullary nail within the right femur bridging the fracture site with associated screw bridging the femoral neck into the femoral head. Hardware is appropriately positioned and intact. There is anatomic alignment over the fracture site. IMPRESSION: Fixation of proximal to mid femoral diaphyseal fracture with hardware intact and anatomic alignment over the fracture site. Electronically Signed   By: Elberta Fortis M.D.   On: 05/22/2019 12:49   DG C-Arm 1-60 Min  Result Date: 05/14/2019 CLINICAL DATA:  Hardware removal right femur EXAM: DG C-ARM  1-60 MIN; DG HIP (WITH OR WITHOUT PELVIS) 2-3V RIGHT FLUOROSCOPY TIME:  Fluoroscopy Time:  29 seconds COMPARISON:  05/15/2018 FINDINGS: Stable IM nail with dynamic hip screw. On the second fluoroscopic image, the distal interlocking screw has been removed. Healed intertrochanteric right hip fracture with heterotopic calcification. IMPRESSION: ORIF right hip with removal of distal interlocking screw. Electronically Signed   By: Charline Bills M.D.   On: 05/14/2019 09:46   DG HIP UNILAT WITH PELVIS 2-3 VIEWS RIGHT  Result Date: 05/14/2019 CLINICAL DATA:  Hardware removal right femur EXAM: DG C-ARM 1-60 MIN; DG HIP (WITH OR WITHOUT PELVIS) 2-3V RIGHT FLUOROSCOPY TIME:  Fluoroscopy Time:  29 seconds COMPARISON:  05/15/2018 FINDINGS: Stable IM nail with dynamic hip screw. On the second fluoroscopic image, the distal interlocking screw has been removed. Healed intertrochanteric right hip fracture with heterotopic calcification. IMPRESSION: ORIF right hip with removal of distal  interlocking screw. Electronically Signed   By: Charline Bills M.D.   On: 05/14/2019 09:46   DG FEMUR, MIN 2 VIEWS RIGHT  Result Date: 05/22/2019 CLINICAL DATA:  Hardware removal. EXAM: DG C-ARM 1-60 MIN; RIGHT FEMUR 2 VIEWS CONTRAST:  None. FLUOROSCOPY TIME:  Fluoroscopy Time:  3 minutes 12 seconds Radiation Exposure Index (if provided by the fluoroscopic device): None known. Number of Acquired Spot Images: 7 COMPARISON:  05/21/2019 FINDINGS: Several spot intraoperative images over the right femur demonstrate evidence of patient's known proximal to mid diaphyseal femoral fracture adjacent the distal aspect of the intramedullary nail. Subsequent images demonstrate placement of a new intramedullary nail within the right femur bridging the fracture site with associated screw bridging the femoral neck into the femoral head. Hardware is appropriately positioned and intact. There is anatomic alignment over the fracture site. IMPRESSION: Fixation of proximal to mid femoral diaphyseal fracture with hardware intact and anatomic alignment over the fracture site. Electronically Signed   By: Elberta Fortis M.D.   On: 05/22/2019 12:49   DG Femur Min 2 Views Right  Result Date: 05/21/2019 CLINICAL DATA:  Fall, right femur pain and deformity, recent surgery to remove hardware EXAM: RIGHT FEMUR 2 VIEWS COMPARISON:  Femur radiograph 05/14/2019 FINDINGS: There is a periprosthetic right femoral fracture with foreshortening of approximately 12 cm and lateral displacement of 1 shaft width. Fracture line appears to originate at the lateral lucent screw tract from the recently removed hardware previously securing the distal extent of a short segment right femoral intramedullary nail. No other femur fracture is identified. Femoral head remains normally located with a transcervical fixation screw. Extensive soft tissue swelling is noted. IMPRESSION: Periprosthetic right femoral fracture with foreshortening and lateral  displacement. Fracture line extends from the lateral lucent screw track at the site of recently removed screw. Extension obliquely through the diaphysis at the terminus of the short segment intramedullary nail. Electronically Signed   By: Kreg Shropshire M.D.   On: 05/21/2019 15:08   Right Femur  Result Date: 05/22/2019 CLINICAL DATA:  Hardware revision with interval ORIF EXAM: RIGHT FEMUR PORTABLE 2 VIEW COMPARISON:  None. FINDINGS: Proximal right femoral diaphysis fracture transfixed with a intramedullary nail and interlocking femoral neck screw as well as distal femoral diaphyseal screws. Healed right intertrochanteric fracture. Normal alignment overall. Soft tissue postsurgical changes along the proximal lateral thigh. IMPRESSION: Proximal right femoral diaphysis fracture transfixed with a intramedullary nail and interlocking femoral neck screw as well as distal femoral diaphyseal screws. Electronically Signed   By: Elige Ko   On: 05/22/2019 13:45   Right Femur  Result Date: 05/14/2019 CLINICAL DATA:  The patient suffered a right intertrochanteric fracture in a fall 05/14/2018 and underwent subsequent internal fixation 05/15/2018. Status post removal of a screw from the femoral component today. Initial encounter. EXAM: RIGHT FEMUR PORTABLE 2 VIEW COMPARISON:  Plain films right hip 05/14/2018 and intraoperative imaging right hip 05/15/2018. FINDINGS: Gamma nail with a short intramedullary femoral component is identified. Screw in the femoral component has been removed. No acute abnormality is identified. Intertrochanteric fracture is healed. IMPRESSION: Status post removal of a screw from intramedullary component of right hip fracture fixation hardware. No acute finding. Electronically Signed   By: Drusilla Kanner M.D.   On: 05/14/2019 11:02    Microbiology: Recent Results (from the past 240 hour(s))  SARS CORONAVIRUS 2 (TAT 6-24 HRS) Nasopharyngeal Nasopharyngeal Swab     Status: None    Collection Time: 05/21/19  5:07 PM   Specimen: Nasopharyngeal Swab  Result Value Ref Range Status   SARS Coronavirus 2 NEGATIVE NEGATIVE Final    Comment: (NOTE) SARS-CoV-2 target nucleic acids are NOT DETECTED. The SARS-CoV-2 RNA is generally detectable in upper and lower respiratory specimens during the acute phase of infection. Negative results do not preclude SARS-CoV-2 infection, do not rule out co-infections with other pathogens, and should not be used as the sole basis for treatment or other patient management decisions. Negative results must be combined with clinical observations, patient history, and epidemiological information. The expected result is Negative. Fact Sheet for Patients: HairSlick.no Fact Sheet for Healthcare Providers: quierodirigir.com This test is not yet approved or cleared by the [place] FDA and  has been authorized for detection and/or diagnosis of SARS-CoV-2 by FDA under an Emergency Use Authorization (EUA). This EUA will remain  in effect (meaning this test can be used) for the duration of the COVID-19 declaration under Section 56 4(b)(1) of the Act, 21 U.S.C. section 360bbb-3(b)(1), unless the authorization is terminated or revoked sooner. Performed at [facility], [address] [place], [place] [address]   Surgical pcr screen     Status: None   Collection Time: 05/21/19 11:15 PM   Specimen: Nasal Mucosa; Nasal Swab  Result Value Ref Range Status   MRSA, PCR NEGATIVE NEGATIVE Final   Staphylococcus aureus NEGATIVE NEGATIVE Final    Comment: (NOTE) The Xpert SA Assay (FDA approved for NASAL specimens in patients 53 years of age and older), is one component of a comprehensive surveillance program. It is not intended to diagnose infection nor to guide or monitor treatment. Performed at [facility], [address], [place], [place] [address]      Labs: Basic Metabolic  Panel: Recent Labs  Lab 05/21/19 1545 05/22/19 0401 05/23/19 0433  NA 141 138 137  K 4.0 4.4 3.2*  CL 105 101 105  CO2 GLUCOSE 166* 172* 155*  BUN CREATININE 0.59 0.60 0.50  CALCIUM 9.7 9.3 8.2*   Liver Function Tests: No results for input(s): AST, ALT, ALKPHOS, BILITOT, PROT, ALBUMIN in the last 168 hours. No results for input(s): LIPASE, AMYLASE in the last 168 hours. No results for input(s): AMMONIA in the last 168 hours. CBC: Recent Labs  Lab 05/21/19 1545 05/22/19 0401 05/23/19 0433  WBC 13.7* 9.6 7.1  NEUTROABS 12.1*  --   --   HGB 14.8 14.4 11.1*  HCT 44.3 43.2 32.9*  MCV 98.4 99.5 98.2  PLT 208 228 170   Cardiac Enzymes: No results for input(s):  CKTOTAL, CKMB, CKMBINDEX, TROPONINI in the last 168 hours. BNP: BNP (last 3 results) No results for input(s): BNP in the last 8760 hours.  ProBNP (last 3 results) No results for input(s): PROBNP in the last 8760 hours.  CBG: Recent Labs  Lab 05/22/19 1241 05/22/19 1646 05/22/19 2026 05/23/19 0642 05/23/19 1135  GLUCAP 125* 192* 220* 180* 238*       Signed:  Jericca Russett  [facility] 05/23/2019, 12:56 PM

## 2019-05-23 NOTE — Discharge Instructions (Signed)
Hip Fracture ° °A hip fracture is a break in the upper part of the thigh bone (femur). This is usually the result of an injury, commonly a fall. °What are the causes? °This condition may be caused by: °· A direct hit or injury (trauma) to the side of the hip, such as from a fall or a car accident. °What increases the risk? °You are more likely to develop this condition if: °· You have poor balance or an unsteady walking pattern (gait). Certain conditions contribute to poor balance, including Parkinson disease and dementia. °· You have thinning or weakening of your bones, such as from osteopenia or osteoporosis. °· You have cancer that spreads to the leg bones. °· You have certain conditions that can weaken your bones, such as thyroid disorders, intestine disorders, or a lack (deficiency) of certain nutrients. °· You smoke. °· You take certain medicines, such as steroids. °· You have a history of broken bones. °What are the signs or symptoms? °Symptoms of this condition include: °· Pain over the injured hip. This is commonly felt on the side of the hip or in the front groin area. °· Stiffness, bruising, and swelling over the hip. °· Pain with movement of the leg, especially lifting it up. Pain often gets better with rest. °· Difficulty or inability to stand, walk, or use the leg to support body weight (put weight on the leg). °· The leg rolling outward when lying down. °· The affected leg being shorter than the other leg. °How is this diagnosed? °This condition may be diagnosed based on: °· Your symptoms. °· A physical exam. °· X-rays. These may be done: °? To confirm the diagnosis. °? To determine the type and location of the fracture. °? To check for other injuries. °· MRI or CT scans. These may be done if the fracture is not visible on an X-ray. °How is this treated? °Treatment for this condition depends on the severity and location of your fracture. In most cases, surgery is necessary. Surgery may  involve: °· Repairing the fracture with a screw, nail, or rod to hold the bone in place (open reduction and internal fixation, ORIF). °· Replacing the damaged parts of the femur with metal implants (hemiarthroplasty or arthroplasty). °If your fracture is less severe, or if you are not eligible for surgery, you may have non-surgical treatment. Non-surgical treatment may involve: °· Using crutches, a walker, or a wheelchair until your health care provider says that you can support (bear) weight on your hip. °· Medicines to help reduce pain and swelling. °· Having regular X-rays to monitor your fracture and make sure that it is healing. °· Physical therapy. You may need physical therapy after surgery, too. °Follow these instructions at home: °Activity °· Do not use your injured leg to support your body weight until your health care provider says that you can. °? Follow standing and walking restrictions as told by your health care provider. °? Use crutches, a walker, or a wheelchair as directed. °· Avoid any activities that cause pain or irritation in your hip. Ask your health care provider what activities are safe for you. °· Do not drive or use heavy machinery until your health care provider approves. °· If physical therapy was prescribed, do exercises as told by your health care provider. °General instructions °· Take over-the-counter and prescription medicines only as told by your health care provider. °· If directed, put ice on the injured area: °? Put ice in a plastic bag. °?   Place a towel between your skin and the bag. °? Leave the ice on for 20 minutes, 2-3 times a day. °· Do not use any products that contain nicotine or tobacco, such as cigarettes and e-cigarettes. These can delay bone healing. If you need help quitting, ask your health care provider. °· Keep all follow-up visits as told by your health care provider. This is important. °How is this prevented? °· To prevent falls at home: °? Use a cane, walker,  or wheelchair as directed. °? Make sure your rooms and hallways are free of clutter, obstacles, and cords. °? Install grab bars in your bedroom and bathrooms. °? Always use handrails when going up and down stairs. °? Use nightlights around the house. °· Exercise regularly. Ask what forms of exercise are safe for you, such as walking and strength and balance exercises. °· Visit an eye doctor regularly to have your eyesight checked. This can help prevent falls. °· Make sure you get enough calcium and vitamin D. °· Do not use any products that contain nicotine or tobacco, such as cigarettes and e-cigarettes. If you need help quitting, ask your health care provider. °· Limit alcohol use. °· If you have an underlying condition that caused your hip fracture, work with your health care provider to manage your condition. °Contact a health care provider if: °· Your pain gets worse or it does not get better with rest or medicine. °· You develop any of the following in your leg or foot: °? Numbness. °? Tingling. °? A change in skin color (discoloration). °? Skin feeling cold to the touch. °Get help right away if: °· Your pain suddenly gets worse. °· You cannot move your hip. °Summary °· A hip fracture is a break in the upper part of the thigh bone (femur). °· Treatment typically require surgical management to restore stability and function to the hip. °· Pain medicine and icing of the affected leg can help manage pain and swelling. Follow directions as told by your health care provider. °This information is not intended to replace advice given to you by your health care provider. Make sure you discuss any questions you have with your health care provider. °Document Released: 05/29/2005 Document Revised: 02/16/2018 Document Reviewed: 07/01/2016 °Elsevier Patient Education © 2020 [employer] ° °

## 2019-05-23 NOTE — Progress Notes (Signed)
Provided discharge education/instructions to  Pt and husband. Educated on Lovenox administration. BSC delivered to room, all questions and concerns addressed, Pt not in distress, discharged home with belongings accompanied by husband.

## 2019-05-23 NOTE — Progress Notes (Signed)
Pt due to void. Foley removed 0700, purewick placed. No PRN meds. Pt stable. Will continue to monitor.

## 2019-05-23 NOTE — Plan of Care (Signed)
  Problem: Health Behavior/Discharge Planning: Goal: Ability to manage health-related needs will improve Outcome: Progressing   Problem: Activity: Goal: Risk for activity intolerance will decrease Outcome: Progressing   Problem: Nutrition: Goal: Adequate nutrition will be maintained Outcome: Progressing   Problem: Coping: Goal: Level of anxiety will decrease Outcome: Progressing   Problem: Elimination: Goal: Will not experience complications related to bowel motility Outcome: Progressing   Problem: Pain Managment: Goal: General experience of comfort will improve Outcome: Progressing   Problem: Safety: Goal: Ability to remain free from injury will improve Outcome: Progressing   Problem: Skin Integrity: Goal: Risk for impaired skin integrity will decrease Outcome: Progressing   

## 2019-05-23 NOTE — Progress Notes (Signed)
Orthopaedic Trauma Progress Note  S: Doing fairly well this morning.  Pleasantly confused. Some pain in right leg but overall well controlled.  O:  Vitals:   05/22/19 1933 05/23/19 0404  BP: (!) 132/57 (!) 123/56  Pulse: 98 87  Resp: 16 16  Temp: 99.7 F (37.6 C) 99 F (37.2 C)  SpO2: 93% 96%    General - Sitting up in bed, no acute distress Respiratory -  No increased work of breathing.  Right lower extremity - Dressings with minimal amount of strikethrough.  Tenderness with palpation of the thigh.  Less tender in the knee and lower leg.  Tolerates a very small amount of passive range of motion of the knee.  Able to wiggle toes.  Ankle dorsiflexion plantarflexion is intact.  Neurovascularly intact.  Imaging: Stable post op imaging.   Labs:  Results for orders placed or performed during the hospital encounter of 05/21/19 (from the past 24 hour(s))  Glucose, capillary     Status: Abnormal   Collection Time: 05/22/19  6:47 AM  Result Value Ref Range   Glucose-Capillary 188 (H) 70 - 99 mg/dL  Glucose, capillary     Status: Abnormal   Collection Time: 05/22/19  9:37 AM  Result Value Ref Range   Glucose-Capillary 128 (H) 70 - 99 mg/dL  Glucose, capillary     Status: Abnormal   Collection Time: 05/22/19 12:41 PM  Result Value Ref Range   Glucose-Capillary 125 (H) 70 - 99 mg/dL  Glucose, capillary     Status: Abnormal   Collection Time: 05/22/19  4:46 PM  Result Value Ref Range   Glucose-Capillary 192 (H) 70 - 99 mg/dL  Glucose, capillary     Status: Abnormal   Collection Time: 05/22/19  8:26 PM  Result Value Ref Range   Glucose-Capillary 220 (H) 70 - 99 mg/dL  AM CBC     Status: Abnormal   Collection Time: 05/23/19  4:33 AM  Result Value Ref Range   WBC 7.1 4.0 - 10.5 K/uL   RBC 3.35 (L) 3.87 - 5.11 MIL/uL   Hemoglobin 11.1 (L) 12.0 - 15.0 g/dL   HCT 32.9 (L) 36.0 - 46.0 %   MCV 98.2 80.0 - 100.0 fL   MCH 33.1 26.0 - 34.0 pg   MCHC 33.7 30.0 - 36.0 g/dL   RDW 14.1 11.5  - 15.5 %   Platelets 170 150 - 400 K/uL   nRBC 0.0 0.0 - 0.2 %    Assessment: 73 year old female status post fall  Injuries: Right periprosthetic femur fracture status post removal of hardware and intramedullary nailing on 05/22/2019  Weightbearing: WBAT RLE  Insicional and dressing care: Change dressing 05/24/19  Showering: Okay to begin showering on Sunday, 05/25/2019  Orthopedic device(s): None   CV/Blood loss: Acute blood loss anemia, Hgb 11.1 this morning. Hemodynamically stable  Pain management:  1. Tylenol 1000 mg q 6 hours scheduled 2. Robaxin 500 mg q 6 hours PRN 3. Tramadol 50-100 mg q 6 hours PRN 4. Neurontin 100 mg TID 5.  Morphine 2 mg q 2 hours PRN  VTE prophylaxis: Lovenox  ID:  Ancef 2gm post op completed  Foley/Lines: Foley removed this morning, KVO IVFs  Medical co-morbidities: Diabetes, HTN, Hyperlipidemia, GERD  Impediments to Fracture Healing: Vitamin D level pending, will start supplementation as indicated.  Dispo: Up with therapy, PT recommending home health. Have ordered DME and HH PT. Okay for discharge from ortho standpoint once cleared by medicine team and therapies  D/C  Rx for pain medication and DVT prophylaxis sent to pharmacy  Follow - up plan: 2 weeks for repeat x-rays  Contact information:  Truitt Merle MD, Ulyses Southward PA-C   Rickey Sadowski A. [facility] [phone] (office) [email or web address]

## 2019-05-23 NOTE — Progress Notes (Signed)
Physical Therapy Treatment Patient Details Name: Chelsea Roberson MRN: [id number] DOB: [birth year] Today's Date: 05/23/2019    History of Present Illness Pt is a 73 y/o female with recent history of distal interlock of cephalomedullary nailing due to stress reaction of femur, now s/p mechanical fall resulting in periprosthetic femur fracture. S/p removal of hardware with IM nail 05/22/19. PMH memory loss with dementia, HTN, HLD, DM, wrist surgery, femur IM nail 2019    PT Comments    Pt making steady progress with functional mobility. Pt's husband present throughout session as well. Plan is to d/c home today with family support. Pt would continue to benefit from skilled physical therapy services at this time while admitted and after d/c to address the below listed limitations in order to improve overall safety and independence with functional mobility.    Follow Up Recommendations  Home health PT;Supervision for mobility/OOB     Equipment Recommendations  Rolling walker with 5" wheels;3in1 (PT)    Recommendations for Other Services       Precautions / Restrictions Precautions Precautions: Fall Restrictions Weight Bearing Restrictions: Yes RLE Weight Bearing: Weight bearing as tolerated    Mobility  Bed Mobility               General bed mobility comments: Pt up in recliner upon arrival  Transfers Overall transfer level: Needs assistance Equipment used: Rolling walker (2 wheeled) Transfers: Sit to/from Stand Sit to Stand: Min assist         General transfer comment: cueing for safe hand placement, assistance needed to power into standing and for stability with transition  Ambulation/Gait Ambulation/Gait assistance: Min guard;Min assist Gait Distance (Feet): 25 Feet Assistive device: Rolling walker (2 wheeled) Gait Pattern/deviations: Step-to pattern;Decreased step length - left;Decreased stance time - right;Decreased weight shift to right;Antalgic Gait  velocity: decreased   General Gait Details: pt requiring frequent cueing and min A for sequencing and safety with RW to maintain close proximity. Pt's spouse present and also cueing patient for safety awareness   Stairs             Wheelchair Mobility    Modified Rankin (Stroke Patients Only)       Balance Overall balance assessment: Needs assistance Sitting-balance support: No upper extremity supported;Feet supported Sitting balance-Leahy Scale: Fair     Standing balance support: Bilateral upper extremity supported Standing balance-Leahy Scale: Poor Standing balance comment: reliant on external support                            Cognition Arousal/Alertness: Awake/alert Behavior During Therapy: Anxious Overall Cognitive Status: History of cognitive impairments - at baseline Area of Impairment: Following commands;Safety/judgement;Problem solving                       Following Commands: Follows one step commands with increased time;Follows one step commands inconsistently Safety/Judgement: Decreased awareness of deficits;Decreased awareness of safety   Problem Solving: Slow processing;Decreased initiation;Difficulty sequencing;Requires verbal cues;Requires tactile cues        Exercises      General Comments        Pertinent Vitals/Pain Pain Assessment: Faces Faces Pain Scale: Hurts even more Pain Location: R LE with motion Pain Descriptors / Indicators: Aching;Sore;Grimacing;Guarding Pain Intervention(s): Monitored during session;Repositioned    Home Living Family/patient expects to be discharged to:: Private residence Living Arrangements: Spouse/significant other Available Help at Discharge: Family;Available 24 hours/day Type of Home: [place]  Access: Stairs to enter Entrance Stairs-Rails: Can reach both Home Layout: One level Home Equipment: Walker - 2 wheels;Cane - single point      Prior Function Level of Independence:  Independent with assistive device(s)      Comments: ambulates with RW   PT Goals (current goals can now be found in the care plan section) Acute Rehab PT Goals Patient Stated Goal: to go home PT Goal Formulation: With patient/family Time For Goal Achievement: 06/05/19 Potential to Achieve Goals: Good Progress towards PT goals: Progressing toward goals    Frequency    Min 3X/week      PT Plan Current plan remains appropriate;Frequency needs to be updated    Co-evaluation              AM-PAC PT "6 Clicks" Mobility   Outcome Measure  Help needed turning from your back to your side while in a flat bed without using bedrails?: A Little Help needed moving from lying on your back to sitting on the side of a flat bed without using bedrails?: A Lot Help needed moving to and from a bed to a chair (including a wheelchair)?: A Little Help needed standing up from a chair using your arms (e.g., wheelchair or bedside chair)?: A Little Help needed to walk in hospital room?: A Little Help needed climbing 3-5 steps with a railing? : A Lot 6 Click Score: 16    End of Session Equipment Utilized During Treatment: Gait belt Activity Tolerance: Patient tolerated treatment well Patient left: in chair;with call bell/phone within reach;with family/visitor present Nurse Communication: Mobility status PT Visit Diagnosis: Muscle weakness (generalized) (M62.81);Pain;Other abnormalities of gait and mobility (R26.89) Pain - Right/Left: Right Pain - part of body: Leg     Time: 1335-1355 PT Time Calculation (min) (ACUTE ONLY): 20 min  Charges:  $Gait Training: 8-22 mins                     Arletta Bale, DPT  Acute Rehabilitation Services Pager [phone] Office [phone]     Alessandra Bevels [place] Santilli 05/23/2019, 3:32 PM

## 2019-05-23 NOTE — Evaluation (Signed)
Occupational Therapy Evaluation Patient Details Name: Chelsea Roberson MRN: [id number] DOB: July 17, 1945 Today's Date: 05/23/2019    History of Present Illness 73yo female with recent history of distal interlock of cephalomedullary nailing due to stress reaction of femur, now s/p mechanical fall resulting in periprosthetic femur fracture. S/p removal of hardware with IM nail 05/22/19. PMH memory loss with dementia, HTN, HLD, DM, wrist surgery, femur IM nail 2019   Clinical Impression   This 73 yo female admitted and underwent above presents to acute OT with decreased balance, decreased mobility, increased pain, and decreased ROM RLE all affecting her PLOF of being S for all basic ADLs due to her dementia. She will benefit from acute OT with follow up HHOT. Husband aware he needs to be with her anytime she is up on her feet and he said that he does have a gait belt.    Follow Up Recommendations  Home health OT;Supervision/Assistance - 24 hour    Equipment Recommendations  3 in 1 bedside commode       Precautions / Restrictions Precautions Precautions: Fall Restrictions Weight Bearing Restrictions: No RLE Weight Bearing: Weight bearing as tolerated      Mobility Bed Mobility               General bed mobility comments: Pt up in recliner upon arrival  Transfers Overall transfer level: Needs assistance Equipment used: Rolling walker (2 wheeled) Transfers: Sit to/from Stand Sit to Stand: Min assist         General transfer comment: VCs for safe hand placement    Balance Overall balance assessment: Needs assistance Sitting-balance support: No upper extremity supported;Feet supported Sitting balance-Leahy Scale: Fair     Standing balance support: Bilateral upper extremity supported Standing balance-Leahy Scale: Poor Standing balance comment: reliant on external support                           ADL either performed or assessed with clinical judgement    ADL Overall ADL's : Needs assistance/impaired Eating/Feeding: Independent;Sitting   Grooming: Set up;Sitting   Upper Body Bathing: Set up;Sitting   Lower Body Bathing: Moderate assistance Lower Body Bathing Details (indicate cue type and reason): min A sit<>stand Upper Body Dressing : Minimal assistance;Sitting   Lower Body Dressing: Maximal assistance Lower Body Dressing Details (indicate cue type and reason): min A sit<>stand; did educate patient and husband on dressing RLE first when it comes to pants and underwear as well as the best sequence for ease of dressing to avoid so many sit<>stands Toilet Transfer: Minimal assistance;Ambulation;RW Toilet Transfer Details (indicate cue type and reason): recliner>door>recliner Toileting- Clothing Manipulation and Hygiene: Moderate assistance Toileting - Clothing Manipulation Details (indicate cue type and reason): min A sit<>stand             Vision Patient Visual Report: No change from baseline              Pertinent Vitals/Pain Pain Assessment: Faces Faces Pain Scale: Hurts even more Pain Location: R LE with motion Pain Descriptors / Indicators: Aching;Sore;Grimacing;Guarding Pain Intervention(s): Limited activity within patient's tolerance;Monitored during session;Repositioned;Ice applied     Hand Dominance Right   Extremity/Trunk Assessment Upper Extremity Assessment Upper Extremity Assessment: Overall WFL for tasks assessed           Communication Communication Communication: No difficulties   Cognition Arousal/Alertness: Awake/alert Behavior During Therapy: Anxious Overall Cognitive Status: History of cognitive impairments - at baseline Area of Impairment: Following  commands;Safety/judgement;Problem solving                       Following Commands: Follows one step commands inconsistently(for RW use given her current LE issue) Safety/Judgement: Decreased awareness of safety;Decreased awareness of  deficits   Problem Solving: Difficulty sequencing;Requires verbal cues;Requires tactile cues(for safety with transfers and ambulation with RW)                Home Living Family/patient expects to be discharged to:: Private residence Living Arrangements: Spouse/significant other Available Help at Discharge: Family;Available 24 hours/day Type of Home: House Home Access: Stairs to enter [employer] of Steps: 3 Entrance Stairs-Rails: Can reach both Home Layout: One level     Bathroom Shower/Tub: Walk-in shower(and walk in tub)   Bathroom Toilet: Handicapped height     Home Equipment: Environmental consultant - 2 wheels;Cane - single point          Prior Functioning/Environment Level of Independence: Independent with assistive device(s)        Comments: ambulates with RW        OT Problem List: Decreased strength;Decreased range of motion;Impaired balance (sitting and/or standing);Decreased cognition;Pain      OT Treatment/Interventions: Self-care/ADL training;DME and/or AE instruction;Patient/family education;Balance training    OT Goals(Current goals can be found in the care plan section) Acute Rehab OT Goals Patient Stated Goal: to go home OT Goal Formulation: With patient/family Time For Goal Achievement: 06/06/19 Potential to Achieve Goals: Good  OT Frequency: Min 2X/week              AM-PAC OT "6 Clicks" Daily Activity     Outcome Measure Help from another person eating meals?: None Help from another person taking care of personal grooming?: A Little Help from another person toileting, which includes using toliet, bedpan, or urinal?: A Little Help from another person bathing (including washing, rinsing, drying)?: A Lot Help from another person to put on and taking off regular upper body clothing?: A Little Help from another person to put on and taking off regular lower body clothing?: A Lot 6 Click Score: 17   End of Session Equipment Utilized During  Treatment: Gait belt;Rolling walker Nurse Communication: (RN already aware of mobility, she was already up in recliner)  Activity Tolerance: Patient tolerated treatment well Patient left: in chair;with call bell/phone within reach;with family/visitor present(pt already up in recliner upon my arrival no chair alarm)  OT Visit Diagnosis: Unsteadiness on feet (R26.81);Other abnormalities of gait and mobility (R26.89);Muscle weakness (generalized) (M62.81);Other symptoms and signs involving cognitive function;Pain Pain - Right/Left: Right Pain - part of body: Leg                Time: 1607-3710 OT Time Calculation (min): 31 min Charges:  OT General Charges $OT Visit: 1 Visit OT Evaluation $OT Eval Moderate Complexity: 1 Mod OT Treatments $Self Care/Home Management : 8-22 mins  [place], OTR/L Acute [employer] Pager [phone] Office [phone]     Almon Register 05/23/2019, 12:59 PM

## 2019-05-23 NOTE — TOC Transition Note (Signed)
Transition of Care [facility]) - CM/SW Discharge Note   Patient Details  Name: Chelsea Roberson MRN: [id number] Date of Birth: 1946-04-05  [facility] CM/SW Contact:  Midge Minium MSN, RN, NCM-BC, ACM-RN [phone] Phone Number: 05/23/2019, 2:20 PM   Clinical Narrative:    CM following for transitional needs. CM spoke to the patient/spouse to discuss the POC. Patient is a 73yo female with recent history of distal interlock of cephalomedullary nailing due to stress reaction of femur, now s/p mechanical fall resulting in periprosthetic femur fracture. S/p removal of hardware with IM nail 05/22/19. PMH memory loss with dementia, HTN, HLD, DM, wrist surgery, femur IM nail 2019. PCP/Demo verified. PT/OT eval completed with HHPT/OT and BSC recommended with the patient/spouse both agreeable. CMS HH compare and DME list provided with no preference. [place] referral given to Jiles Harold RN [facility] liaison); DME referral given to [place] ([place] liaison); AVS updated. The patients spouse will provide assistance post-discharge and transportation home. No further needs from CM.    Final next level of care: [place] Barriers to Discharge: No Barriers Identified   Patient Goals and CMS Choice Patient states their goals for this hospitalization and ongoing recovery are:: "to go home" CMS Medicare.gov Compare Post Acute Care list provided to:: Patient Represenative (must comment)([name]) Choice offered to / list presented to : Spouse([name])   Discharge Plan and Services                DME Arranged: Bedside commode DME Agency: [employer] Date DME Agency Contacted: 05/23/19 Time DME Agency Contacted: [id number] Representative spoke with at DME Agency: Bertrum Sol (liaison) [place] Arranged: PT, OT [place] Agency: [place] Date [place]: 05/23/19 Time [place]: 1415 Representative spoke with at [place]  (liaison)  Social Determinants of Health ([place]) Interventions     Readmission Risk Interventions No flowsheet data found.

## 2019-06-18 ENCOUNTER — Encounter (INDEPENDENT_AMBULATORY_CARE_PROVIDER_SITE_OTHER): Payer: Medicare Other | Admitting: Ophthalmology

## 2019-06-18 DIAGNOSIS — E11311 Type 2 diabetes mellitus with unspecified diabetic retinopathy with macular edema: Secondary | ICD-10-CM

## 2019-06-18 DIAGNOSIS — I10 Essential (primary) hypertension: Secondary | ICD-10-CM | POA: Diagnosis not present

## 2019-06-18 DIAGNOSIS — H35033 Hypertensive retinopathy, bilateral: Secondary | ICD-10-CM

## 2019-06-18 DIAGNOSIS — H43813 Vitreous degeneration, bilateral: Secondary | ICD-10-CM

## 2019-06-18 DIAGNOSIS — E113313 Type 2 diabetes mellitus with moderate nonproliferative diabetic retinopathy with macular edema, bilateral: Secondary | ICD-10-CM | POA: Diagnosis not present

## 2019-07-16 ENCOUNTER — Encounter (INDEPENDENT_AMBULATORY_CARE_PROVIDER_SITE_OTHER): Payer: Medicare PPO | Admitting: Ophthalmology

## 2019-07-16 DIAGNOSIS — H35033 Hypertensive retinopathy, bilateral: Secondary | ICD-10-CM

## 2019-07-16 DIAGNOSIS — E11311 Type 2 diabetes mellitus with unspecified diabetic retinopathy with macular edema: Secondary | ICD-10-CM

## 2019-07-16 DIAGNOSIS — H43813 Vitreous degeneration, bilateral: Secondary | ICD-10-CM

## 2019-07-16 DIAGNOSIS — E113313 Type 2 diabetes mellitus with moderate nonproliferative diabetic retinopathy with macular edema, bilateral: Secondary | ICD-10-CM

## 2019-07-16 DIAGNOSIS — I10 Essential (primary) hypertension: Secondary | ICD-10-CM | POA: Diagnosis not present

## 2019-08-13 ENCOUNTER — Encounter (INDEPENDENT_AMBULATORY_CARE_PROVIDER_SITE_OTHER): Payer: Medicare PPO | Admitting: Ophthalmology

## 2019-08-13 DIAGNOSIS — I10 Essential (primary) hypertension: Secondary | ICD-10-CM | POA: Diagnosis not present

## 2019-08-13 DIAGNOSIS — E11311 Type 2 diabetes mellitus with unspecified diabetic retinopathy with macular edema: Secondary | ICD-10-CM

## 2019-08-13 DIAGNOSIS — H35033 Hypertensive retinopathy, bilateral: Secondary | ICD-10-CM | POA: Diagnosis not present

## 2019-08-13 DIAGNOSIS — H43813 Vitreous degeneration, bilateral: Secondary | ICD-10-CM

## 2019-08-13 DIAGNOSIS — E113313 Type 2 diabetes mellitus with moderate nonproliferative diabetic retinopathy with macular edema, bilateral: Secondary | ICD-10-CM

## 2019-09-17 ENCOUNTER — Other Ambulatory Visit: Payer: Self-pay

## 2019-09-17 ENCOUNTER — Encounter (INDEPENDENT_AMBULATORY_CARE_PROVIDER_SITE_OTHER): Payer: Medicare PPO | Admitting: Ophthalmology

## 2019-09-17 DIAGNOSIS — E113313 Type 2 diabetes mellitus with moderate nonproliferative diabetic retinopathy with macular edema, bilateral: Secondary | ICD-10-CM

## 2019-09-17 DIAGNOSIS — H35033 Hypertensive retinopathy, bilateral: Secondary | ICD-10-CM

## 2019-09-17 DIAGNOSIS — H43813 Vitreous degeneration, bilateral: Secondary | ICD-10-CM

## 2019-09-17 DIAGNOSIS — I10 Essential (primary) hypertension: Secondary | ICD-10-CM

## 2019-09-17 DIAGNOSIS — E11311 Type 2 diabetes mellitus with unspecified diabetic retinopathy with macular edema: Secondary | ICD-10-CM

## 2019-09-19 NOTE — H&P (Signed)
TOTAL KNEE ADMISSION H&P  Patient is being admitted for left total knee arthroplasty.  Subjective:  Chief Complaint: Left knee pain.  HPI: Chelsea Roberson, 74 y.o. female has a history of pain and functional disability in the left knee due to arthritis and has failed non-surgical conservative treatments for greater than 12 weeks to include corticosteriod injections, viscosupplementation injections and activity modification. Onset of symptoms was gradual, starting several years ago with gradually worsening course since that time. The patient noted prior procedures on the knee to include  arthroscopy and menisectomy on the left knee.  Patient currently rates pain in the left knee at 7 out of 10 with activity. Patient has worsening of pain with activity and weight bearing, pain with passive range of motion and crepitus. Patient has evidence of bone-on-bone medial and patellofemoral arthritis with large osteophyte formation by imaging studies. There is no active infection.  Patient Active Problem List   Diagnosis Date Noted  . Peri-prosthetic femoral shaft fracture 05/21/2019  . Painful orthopaedic hardware (HCC) 05/14/2019  . Closed fracture of right hip (HCC) 04/29/2019  . Closed comminuted intertrochanteric fracture of proximal end of right femur (HCC) 05/14/2018  . Dementia (HCC)   . Pain in the chest   . Chest pain 02/19/2015  . HTN (hypertension) 02/19/2015  . Hyperlipidemia 02/19/2015  . DM (diabetes mellitus), type 2 (HCC) 02/19/2015  . Hypothyroidism 02/19/2015  . GERD (gastroesophageal reflux disease) 02/19/2015  . Anxiety 02/19/2015  . Dizziness 11/02/2014  . Palpitations 11/02/2014    Past Medical History:  Diagnosis Date  . Anxiety   . Arthritis   . Closed right hip fracture, initial encounter (HCC) 05/14/2018  . Complication of anesthesia   . Dementia (HCC)   . DM (diabetes mellitus), type 2 (HCC)   . GERD (gastroesophageal reflux disease)   . Hyperlipidemia   .  Hypertension   . Hypothyroidism   . Memory loss   . PONV (postoperative nausea and vomiting)   . Sleep apnea    history of sleep apnea but pt states that no longer has sleep apnea after weight loss    Past Surgical History:  Procedure Laterality Date  . COLONOSCOPY WITH PROPOFOL N/A 05/09/2016   Procedure: COLONOSCOPY WITH PROPOFOL;  Surgeon: Charolett Bumpers, MD;  Location: [facility];  Service: Endoscopy;  Laterality: N/A;  . EYE SURGERY Bilateral    cataracts  . FEMUR IM NAIL Right 05/15/2018   Procedure: INTRAMEDULLARY (IM) NAIL FEMORAL;  Surgeon: Roby Lofts, MD;  Location: [facility] OR;  Service: Orthopedics;  Laterality: Right;  . FEMUR IM NAIL Right 05/22/2019   Procedure: REMOVAL OF HARDWARE WITH INTRAMEDULLARY (IM) NAIL FEMORAL;  Surgeon: Roby Lofts, MD;  Location: [facility] OR;  Service: Orthopedics;  Laterality: Right;  . HARDWARE REMOVAL Right 05/14/2019   Procedure: HARDWARE REMOVAL RIGHT FEMUR;  Surgeon: Roby Lofts, MD;  Location: [facility] OR;  Service: Orthopedics;  Laterality: Right;  REMOVED ONE SCREW  . PARTIAL HYSTERECTOMY    . WRIST SURGERY Right     Prior to Admission medications   Medication Sig Start Date End Date Taking? Authorizing Provider  acetaminophen (TYLENOL) 650 MG CR tablet Take 1,300-1,950 mg by mouth every 8 (eight) hours as needed for pain.    Yes [provider]  alendronate (FOSAMAX) 70 MG tablet Take 70 mg by mouth every Sunday.  03/12/18  Yes [provider]  amLODipine (NORVASC) 2.5 MG tablet Take 2.5 mg by mouth daily.   Yes [provider]  busPIRone (BUSPAR) 15 MG tablet Take 7.5-15 mg by mouth See admin instructions. 7.5mg  in the morning and 15mg  at bedtime   Yes [provider]  Calcium Citrate-Vitamin D (CALCIUM + D PO) Take 1 tablet by mouth daily.   Yes [provider]  canagliflozin (INVOKANA) 100 MG TABS tablet Take 100 mg by mouth daily before breakfast.   Yes [provider]    carvedilol (COREG) 3.125 MG tablet Take 1 tablet (3.125 mg total) by mouth 2 (two) times daily with a meal. 02/20/15  Yes 04/22/15, MD  Cholecalciferol (VITAMIN D3) 50 MCG (2000 UT) capsule Take 2,000 Units by mouth daily.   Yes [provider]  docusate sodium (COLACE) 100 MG capsule Take 100 mg by mouth daily.   Yes [provider]  famotidine (PEPCID) 20 MG tablet Take 20 mg by mouth 2 (two) times daily.   Yes [provider]  Garlic 1000 MG CAPS Take 1,000 mg by mouth daily.   Yes [provider]  glimepiride (AMARYL) 1 MG tablet Take 1 mg by mouth every morning. 06/20/19  Yes [provider]  levothyroxine (SYNTHROID, LEVOTHROID) 50 MCG tablet Take 50 mcg by mouth daily before breakfast.   Yes [provider]  losartan-hydrochlorothiazide (HYZAAR) 50-12.5 MG per tablet Take 1 tablet by mouth daily.   Yes [provider]  Multiple Vitamin (MULTIVITAMIN WITH MINERALS) TABS Take 1 tablet by mouth daily.    Yes [provider]  NAMZARIC 28-10 MG CP24 Take 28 mg by mouth at bedtime.  03/31/18  Yes [provider]  simvastatin (ZOCOR) 20 MG tablet Take 20 mg by mouth at bedtime.    Yes [provider]  tolterodine (DETROL LA) 4 MG 24 hr capsule Take 4 mg by mouth at bedtime.   Yes [provider]  traZODone (DESYREL) 50 MG tablet Take 25 mg by mouth at bedtime.  04/11/18  Yes [provider]  trimethoprim (TRIMPEX) 100 MG tablet Take 100 mg by mouth at bedtime.  03/18/16  Yes [provider]  Besifloxacin HCl (BESIVANCE) 0.6 % SUSP Place 1 drop into both eyes See admin instructions. Instill 1 drop into both eyes 4 times daily for 2 days after eye injections    [provider]  enoxaparin (LOVENOX) 40 MG/0.4ML injection Inject 0.4 mLs (40 mg total) into the skin daily. 05/23/19 06/22/19  08/20/19, PA-C  ketorolac (ACULAR) 0.5 % ophthalmic solution Place 1 drop into  both eyes See admin instructions. Instill 1 drop into both eyes 4 times daily for 7 days after eye injections    [provider]    Allergies  Allergen Reactions  . Ciprofloxacin Other (See Comments)    HEADACHE   . Sulfa Antibiotics Nausea Only    Social History   Socioeconomic History  . Marital status: Married    Spouse name: Not on file  . Number of children: Not on file  . Years of education: Not on file  . Highest education level: Not on file  Occupational History  . 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 Smoking status: Never Smoker  . Smokeless tobacco: Never Used  Substance and Sexual Activity  . Alcohol use: No  . Drug use: No  . Sexual activity: Not on file  Other Topics Concern  . Not on file  Social History Narrative  . Not on file   Social Determinants of Health  Financial Resource Strain:   . Difficulty of Paying Living Expenses:   Food Insecurity:   . Worried About Charity fundraiser in the Last Year:   . Arboriculturist in the Last Year:   Transportation Needs:   . Film/video editor (Medical):   Marland Kitchen Lack of Transportation (Non-Medical):   Physical Activity:   . Days of Exercise per Week:   . Minutes of Exercise per Session:   Stress:   . Feeling of Stress :   Social Connections:   . Frequency of Communication with Friends and Family:   . Frequency of Social Gatherings with Friends and Family:   . Attends Religious Services:   . Active Member of Clubs or Organizations:   . Attends Archivist Meetings:   Marland Kitchen Marital Status:   Intimate Partner Violence:   . Fear of Current or Ex-Partner:   . Emotionally Abused:   Marland Kitchen Physically Abused:   . Sexually Abused:       Tobacco Use: Low Risk   . Smoking Tobacco Use: Never Smoker  . Smokeless Tobacco Use: Never Used   Social History   Substance and Sexual Activity  Alcohol Use No    Family History  Problem Relation Age of Onset  . Failure to  thrive Mother   . CAD Father        CAD diagnosed around 44, passed away at 15 of MI  . CAD Brother        Died in his 7s of MI, had had CABG about 2 years before he died    Review of Systems  Constitutional: Negative for chills and fever.  HENT: Negative for congestion, sore throat and tinnitus.   Eyes: Negative for double vision, photophobia and pain.  Respiratory: Negative for cough, shortness of breath and wheezing.   Cardiovascular: Negative for chest pain, palpitations and orthopnea.  Gastrointestinal: Negative for heartburn, nausea and vomiting.  Genitourinary: Negative for dysuria, frequency and urgency.  Musculoskeletal: Positive for joint pain.  Neurological: Negative for dizziness, weakness and headaches.    Objective:  Physical Exam: Well nourished and well developed.  General: Alert and oriented x3, cooperative and pleasant, no acute distress.  Head: normocephalic, atraumatic, neck supple.  Eyes: EOMI.  Respiratory: breath sounds clear in all fields, no wheezing, rales, or rhonchi. Cardiovascular: Regular rate and rhythm, no murmurs, gallops or rubs.  Abdomen: non-tender to palpation and soft, normoactive bowel sounds. Musculoskeletal:  Left Knee Exam:  No effusion present. No swelling present. The range of motion is: 5 to 110 degrees.  Moderate crepitus on range of motion of the knee.  Medial joint line tenderness. No lateral joint line tenderness.  The knee is stable.  Calves soft and nontender. Motor function intact in LE. Strength 5/5 LE bilaterally. Neuro: Distal pulses 2+. Sensation to light touch intact in LE.   Imaging Review Plain radiographs demonstrate severe degenerative joint disease of the left knee. The overall alignment is neutral. The bone quality appears to be adequate for age and reported activity level.  Assessment/Plan:  End stage arthritis, left knee   The patient history, physical examination, clinical judgment of the provider and  imaging studies are consistent with end stage degenerative joint disease of the left knee and total knee arthroplasty is deemed medically necessary. The treatment options including medical management, injection therapy arthroscopy and arthroplasty were discussed at length. The risks and benefits of total knee arthroplasty were presented and reviewed. The risks due to  aseptic loosening, infection, stiffness, patella tracking problems, thromboembolic complications and other imponderables were discussed. The patient acknowledged the explanation, agreed to proceed with the plan and consent was signed. Patient is being admitted for inpatient treatment for surgery, pain control, PT, OT, prophylactic antibiotics, VTE prophylaxis, progressive ambulation and ADLs and discharge planning. The patient is planning to be discharged home.  Anticipated LOS equal to or greater than 2 midnights due to - Age 53 and older with one or more of the following:  - Obesity  - Expected need for hospital services (PT, OT, Nursing) required for safe  discharge  - Anticipated need for postoperative skilled nursing care or inpatient rehab  - Active co-morbidities: Diabetes and Dementia OR   - Unanticipated findings during/Post Surgery: None  - Patient is a high risk of re-admission due to: None  Therapy Plans: Outpatient therapy at [facility] ([place]) Disposition: Home with husband Planned DVT Prophylaxis: Aspirin 325 mg BID DME Needed: None PCP: Dr. Pete Glatter (clearance recieved) TXA: IV Allergies: Sulfa (nausea) Anesthesia Concerns: Difficulty awakening BMI: 25.5 Last HgbA1c: 6.7%  Other: Pt diagnosed with alzheimers  - Patient was instructed on what medications to stop prior to surgery. - Follow-up visit in 2 weeks with Dr. Lequita Halt - Begin physical therapy following surgery - Pre-operative lab work as pre-surgical testing - Prescriptions will be provided in hospital at time of discharge  Arther Abbott,  PA-C Orthopedic Surgery [facility] [place]

## 2019-09-25 NOTE — Patient Instructions (Signed)
DUE TO COVID-19 ONLY ONE VISITOR IS ALLOWED TO COME WITH YOU AND STAY IN THE WAITING ROOM ONLY DURING PRE OP AND PROCEDURE DAY OF SURGERY. THE 2 VISITORS MAY VISIT WITH YOU AFTER SURGERY IN YOUR PRIVATE ROOM DURING VISITING HOURS ONLY!  YOU NEED TO HAVE A COVID 19 TEST ON   4/22__ @_______ , THIS TEST MUST BE DONE BEFORE SURGERY, COME  [address] [place] , [id number].  ([place]) ONCE YOUR COVID TEST IS COMPLETED, PLEASE BEGIN THE QUARANTINE INSTRUCTIONS AS OUTLINED IN YOUR HANDOUT.                Chelsea Roberson    Your procedure is scheduled on: 10/06/19   Report to [facility] Main  Entrance   Report to Short Stay at 5:30  AM     Call this number if you have problems the morning of surgery [phone]   . BRUSH YOUR TEETH MORNING OF SURGERY AND RINSE YOUR MOUTH OUT, NO CHEWING GUM CANDY OR MINTS.    Do not eat food After Midnight.   YOU MAY HAVE CLEAR LIQUIDS FROM MIDNIGHT UNTIL 4:30AM.   At 4:30AM Please finish the prescribed Pre-Surgery Gatorade drink  . Nothing by mouth after you finish the Gatorade drink !   Take these medicines the morning of surgery with A SIP OF WATER: Buspirone, Coreg, Amlodipine, Levothyroxine, pepcid, Detrol  DO NOT TAKE ANY DIABETIC MEDICATIONS DAY OF YOUR SURGERY       How to Manage Your Diabetes Before and After Surgery  Why is it important to control my blood sugar before and after surgery? . Improving blood sugar levels before and after surgery helps healing and can limit problems. . A way of improving blood sugar control is eating a healthy diet by: o  Eating less sugar and carbohydrates o  Increasing activity/exercise o  Talking with your doctor about reaching your blood sugar goals . High blood sugars (greater than 180 mg/dL) can raise your risk of infections and slow your recovery, so you will need to focus on controlling your diabetes during the weeks before surgery. . Make sure that the doctor who  takes care of your diabetes knows about your planned surgery including the date and location.  How do I manage my blood sugar before surgery? . Check your blood sugar at least 4 times a day, starting 2 days before surgery, to make sure that the level is not too high or low. o Check your blood sugar the morning of your surgery when you wake up and every 2 hours until you get to the Short Stay unit. . If your blood sugar is less than 70 mg/dL, you will need to treat for low blood sugar: o Do not take insulin. o Treat a low blood sugar (less than 70 mg/dL) with  cup of clear juice (cranberry or apple), 4 glucose tablets, OR glucose gel. o Recheck blood sugar in 15 minutes after treatment (to make sure it is greater than 70 mg/dL). If your blood sugar is not greater than 70 mg/dL on recheck, call [phone] for further instructions. . Report your blood sugar to the short stay nurse when you get to Short Stay.  . If you are admitted to the hospital after surgery: o Your blood sugar will be checked by the staff and you will probably be given insulin after surgery (instead of oral diabetes medicines) to make sure you have good blood sugar levels. o The goal  for blood sugar control after surgery is 80-180 mg/dL.   WHAT DO I DO ABOUT MY DIABETES MEDICATION?  Marland Kitchen Do not take oral diabetes medicines (pills) the morning of surgery.               You may not have any metal on your body including hair pins and              piercings  Do not wear jewelry, make-up, lotions, powders or perfumes, deodorant             Do not wear nail polish on your fingernails.  Do not shave  48 hours prior to surgery.     Do not bring valuables to the hospital. [facility] IS NOT             RESPONSIBLE   FOR VALUABLES.  Contacts, dentures or bridgework may not be worn into surgery.        Name and phone number of your driver:  Special Instructions: N/A              Please read over the following fact sheets you  were given: _____________________________________________________________________             [facility] - Preparing for Surgery Before surgery, you can play an important role.   Because skin is not sterile, your skin needs to be as free of germs as possible.   You can reduce the number of germs on your skin by washing with CHG (chlorahexidine gluconate) soap before surgery.   CHG is an antiseptic cleaner which kills germs and bonds with the skin to continue killing germs even after washing. Please DO NOT use if you have an allergy to CHG or antibacterial soaps .  If your skin becomes reddened/irritated stop using the CHG and inform your nurse when you arrive at Short Stay. Do not shave (including legs and underarms) for at least 48 hours prior to the first CHG shower.  . Please follow these instructions carefully:  1.  Shower with CHG Soap the night before surgery and the  morning of Surgery.  2.  If you choose to wash your hair, wash your hair first as usual with your  normal  shampoo.  3.  After you shampoo, rinse your hair and body thoroughly to remove the  shampoo.                                        4.  Use CHG as you would any other liquid soap.  You can apply chg directly  to the skin and wash                       Gently with a scrungie or clean washcloth.  5.  Apply the CHG Soap to your body ONLY FROM THE NECK DOWN.   Do not use on face/ open                           Wound or open sores. Avoid contact with eyes, ears mouth and genitals (private parts).                       Wash face,  Genitals (private parts) with your normal soap.  6.  Wash thoroughly, paying special attention to the area where your surgery  will be performed.  7.  Thoroughly rinse your body with warm water from the neck down.  8.  DO NOT shower/wash with your normal soap after using and rinsing off  the CHG Soap.             9.  Pat yourself dry with a clean towel.            10.  Wear clean  pajamas.            11.  Place clean sheets on your bed the night of your first shower and do not  sleep with pets. Day of Surgery : Do not apply any lotions/deodorants the morning of surgery.  Please wear clean clothes to the hospital/surgery center.  FAILURE TO FOLLOW THESE INSTRUCTIONS MAY RESULT IN THE CANCELLATION OF YOUR SURGERY PATIENT SIGNATURE_________________________________  NURSE SIGNATURE__________________________________  ________________________________________________________________________   Chelsea Roberson  An incentive spirometer is a tool that can help keep your lungs clear and active. This tool measures how well you are filling your lungs with each breath. Taking long deep breaths may help reverse or decrease the chance of developing breathing (pulmonary) problems (especially infection) following:  A long period of time when you are unable to move or be active. BEFORE THE PROCEDURE   If the spirometer includes an indicator to show your best effort, your nurse or respiratory therapist will set it to a desired goal.  If possible, sit up straight or lean slightly forward. Try not to slouch.  Hold the incentive spirometer in an upright position. INSTRUCTIONS FOR USE  1. Sit on the edge of your bed if possible, or sit up as far as you can in bed or on a chair. 2. Hold the incentive spirometer in an upright position. 3. Breathe out normally. 4. Place the mouthpiece in your mouth and seal your lips tightly around it. 5. Breathe in slowly and as deeply as possible, raising the piston or the ball toward the top of the column. 6. Hold your breath for 3-5 seconds or for as long as possible. Allow the piston or ball to fall to the bottom of the column. 7. Remove the mouthpiece from your mouth and breathe out normally. 8. Rest for a few seconds and repeat Steps 1 through 7 at least 10 times every 1-2 hours when you are awake. Take your time and take a few normal breaths  between deep breaths. 9. The spirometer may include an indicator to show your best effort. Use the indicator as a goal to work toward during each repetition. 10. After each set of 10 deep breaths, practice coughing to be sure your lungs are clear. If you have an incision (the cut made at the time of surgery), support your incision when coughing by placing a pillow or rolled up towels firmly against it. Once you are able to get out of bed, walk around indoors and cough well. You may stop using the incentive spirometer when instructed by your caregiver.  RISKS AND COMPLICATIONS  Take your time so you do not get dizzy or light-headed.  If you are in pain, you may need to take or ask for pain medication before doing incentive spirometry. It is harder to take a deep breath if you are having pain. AFTER USE  Rest and breathe slowly and easily.  It can be helpful to keep track of a log of your progress. Your  caregiver can provide you with a simple table to help with this. If you are using the spirometer at home, follow these instructions: [place] IF:   You are having difficultly using the spirometer.  You have trouble using the spirometer as often as instructed.  Your pain medication is not giving enough relief while using the spirometer.  You develop fever of 100.5 F (38.1 C) or higher. SEEK IMMEDIATE MEDICAL CARE IF:   You cough up bloody sputum that had not been present before.  You develop fever of 102 F (38.9 C) or greater.  You develop worsening pain at or near the incision site. MAKE SURE YOU:   Understand these instructions.  Will watch your condition.  Will get help right away if you are not doing well or get worse. Document Released: 10/09/2006 Document Revised: 08/21/2011 Document Reviewed: 12/10/2006 [facility] Patient Information 2014 [place], [place].   ________________________________________________________________________

## 2019-09-26 ENCOUNTER — Other Ambulatory Visit: Payer: Self-pay

## 2019-09-26 ENCOUNTER — Encounter (HOSPITAL_COMMUNITY): Payer: Self-pay

## 2019-09-26 ENCOUNTER — Encounter (HOSPITAL_COMMUNITY)
Admission: RE | Admit: 2019-09-26 | Discharge: 2019-09-26 | Disposition: A | Discharge status: Home / Self Care | Payer: Medicare PPO | Source: Ambulatory Visit | Attending: Orthopedic Surgery | Admitting: Orthopedic Surgery

## 2019-09-26 DIAGNOSIS — Z01812 Encounter for preprocedural laboratory examination: Secondary | ICD-10-CM | POA: Insufficient documentation

## 2019-09-26 LAB — CBC
HCT: 44.1 % (ref 36.0–46.0)
Hemoglobin: 14.3 g/dL (ref 12.0–15.0)
MCH: 32.7 pg (ref 26.0–34.0)
MCHC: 32.4 g/dL (ref 30.0–36.0)
MCV: 100.9 fL — ABNORMAL HIGH (ref 80.0–100.0)
Platelets: 201 10*3/uL (ref 150–400)
RBC: 4.37 MIL/uL (ref 3.87–5.11)
RDW: 13.3 % (ref 11.5–15.5)
WBC: 5.7 10*3/uL (ref 4.0–10.5)
nRBC: 0 % (ref 0.0–0.2)

## 2019-09-26 LAB — COMPREHENSIVE METABOLIC PANEL
ALT: 18 U/L (ref 0–44)
AST: 23 U/L (ref 15–41)
Albumin: 4.1 g/dL (ref 3.5–5.0)
Alkaline Phosphatase: 72 U/L (ref 38–126)
Anion gap: 10 (ref 5–15)
BUN: 18 mg/dL (ref 8–23)
CO2: 28 mmol/L (ref 22–32)
Calcium: 9.6 mg/dL (ref 8.9–10.3)
Chloride: 105 mmol/L (ref 98–111)
Creatinine, Ser: 0.62 mg/dL (ref 0.44–1.00)
GFR calc Af Amer: >60 mL/min (ref >60)
GFR calc non Af Amer: >60 mL/min (ref >60)
Glucose, Bld: 98 mg/dL (ref 70–99)
Potassium: 4.7 mmol/L (ref 3.5–5.1)
Sodium: 143 mmol/L (ref 135–145)
Total Bilirubin: 0.7 mg/dL (ref 0.3–1.2)
Total Protein: 7.1 g/dL (ref 6.5–8.1)

## 2019-09-26 LAB — PROTIME-INR
INR: 1.1 (ref 0.8–1.2)
Prothrombin Time: 13.7 seconds (ref 11.4–15.2)

## 2019-09-26 LAB — SURGICAL PCR SCREEN
MRSA, PCR: NEGATIVE
Staphylococcus aureus: NEGATIVE

## 2019-09-26 LAB — ABO/RH: ABO/RH(D): A NEG

## 2019-09-26 LAB — APTT: aPTT: 28 seconds (ref 24–36)

## 2019-09-26 NOTE — Progress Notes (Signed)
PCP - Dr. Alexis Goodell Cardiologist - no  Chest x-ray - no EKG - 05/22/19 Stress Test - no ECHO - 2016 Cardiac Cath - no  Sleep Study - yes CPAP -no longer needs because of Wt loss   Fasting Blood Sugar - 108-124 Checks Blood Sugar _____ times a day  2/wk  Blood Thinner Instructions:NA Aspirin Instructions: Last Dose:  Anesthesia review:   Patient denies shortness of breath, fever, cough and chest pain at PAT appointment yes  Patient verbalized understanding of instructions that were given to them at the PAT appointment. Patient was also instructed that they will need to review over the PAT instructions again at home before surgery. yes

## 2019-10-02 ENCOUNTER — Other Ambulatory Visit (HOSPITAL_COMMUNITY)
Admission: RE | Admit: 2019-10-02 | Discharge: 2019-10-02 | Disposition: A | Discharge status: Home / Self Care | Payer: Medicare PPO | Source: Ambulatory Visit | Attending: Orthopedic Surgery | Admitting: Orthopedic Surgery

## 2019-10-02 DIAGNOSIS — Z01812 Encounter for preprocedural laboratory examination: Secondary | ICD-10-CM | POA: Diagnosis present

## 2019-10-02 DIAGNOSIS — Z20822 Contact with and (suspected) exposure to covid-19: Secondary | ICD-10-CM | POA: Diagnosis not present

## 2019-10-02 LAB — SARS CORONAVIRUS 2 (TAT 6-24 HRS): SARS Coronavirus 2: NEGATIVE

## 2019-10-05 MED ORDER — BUPIVACAINE LIPOSOME 1.3 % IJ SUSP
20.0000 mL | Freq: Once | INTRAMUSCULAR | Status: DC
  Filled 2019-10-05: qty 20

## 2019-10-06 ENCOUNTER — Ambulatory Visit (HOSPITAL_COMMUNITY)
Admission: RE | Admit: 2019-10-06 | Discharge: 2019-10-07 | Disposition: A | Discharge status: Home / Self Care | Payer: Medicare PPO | Attending: Orthopedic Surgery | Admitting: Orthopedic Surgery

## 2019-10-06 ENCOUNTER — Other Ambulatory Visit: Payer: Self-pay

## 2019-10-06 ENCOUNTER — Encounter (HOSPITAL_COMMUNITY)
Admission: RE | Disposition: A | Discharge status: Home / Self Care | Payer: Self-pay | Source: Home / Self Care | Attending: Orthopedic Surgery

## 2019-10-06 ENCOUNTER — Ambulatory Visit (HOSPITAL_COMMUNITY): Payer: Medicare PPO | Admitting: Anesthesiology

## 2019-10-06 ENCOUNTER — Encounter (HOSPITAL_COMMUNITY): Payer: Self-pay | Admitting: Orthopedic Surgery

## 2019-10-06 DIAGNOSIS — G4730 Sleep apnea, unspecified: Secondary | ICD-10-CM | POA: Insufficient documentation

## 2019-10-06 DIAGNOSIS — K219 Gastro-esophageal reflux disease without esophagitis: Secondary | ICD-10-CM | POA: Diagnosis not present

## 2019-10-06 DIAGNOSIS — Z79899 Other long term (current) drug therapy: Secondary | ICD-10-CM | POA: Diagnosis not present

## 2019-10-06 DIAGNOSIS — M179 Osteoarthritis of knee, unspecified: Secondary | ICD-10-CM | POA: Diagnosis present

## 2019-10-06 DIAGNOSIS — E039 Hypothyroidism, unspecified: Secondary | ICD-10-CM | POA: Insufficient documentation

## 2019-10-06 DIAGNOSIS — Z7984 Long term (current) use of oral hypoglycemic drugs: Secondary | ICD-10-CM | POA: Diagnosis not present

## 2019-10-06 DIAGNOSIS — Z79890 Hormone replacement therapy (postmenopausal): Secondary | ICD-10-CM | POA: Insufficient documentation

## 2019-10-06 DIAGNOSIS — F419 Anxiety disorder, unspecified: Secondary | ICD-10-CM | POA: Insufficient documentation

## 2019-10-06 DIAGNOSIS — Z7901 Long term (current) use of anticoagulants: Secondary | ICD-10-CM | POA: Diagnosis not present

## 2019-10-06 DIAGNOSIS — I10 Essential (primary) hypertension: Secondary | ICD-10-CM | POA: Diagnosis not present

## 2019-10-06 DIAGNOSIS — E785 Hyperlipidemia, unspecified: Secondary | ICD-10-CM | POA: Diagnosis not present

## 2019-10-06 DIAGNOSIS — M1712 Unilateral primary osteoarthritis, left knee: Secondary | ICD-10-CM | POA: Diagnosis present

## 2019-10-06 DIAGNOSIS — M1710 Unilateral primary osteoarthritis, unspecified knee: Secondary | ICD-10-CM | POA: Diagnosis present

## 2019-10-06 DIAGNOSIS — E1136 Type 2 diabetes mellitus with diabetic cataract: Secondary | ICD-10-CM | POA: Insufficient documentation

## 2019-10-06 HISTORY — PX: TOTAL KNEE ARTHROPLASTY: SHX125

## 2019-10-06 LAB — TYPE AND SCREEN
ABO/RH(D): A NEG
Antibody Screen: NEGATIVE

## 2019-10-06 LAB — GLUCOSE, CAPILLARY
Glucose-Capillary: 113 mg/dL — ABNORMAL HIGH (ref 70–99)
Glucose-Capillary: 90 mg/dL (ref 70–99)

## 2019-10-06 SURGERY — ARTHROPLASTY, KNEE, TOTAL
Anesthesia: Spinal | Site: Knee | Laterality: Left

## 2019-10-06 MED ORDER — PROMETHAZINE HCL 25 MG/ML IJ SOLN: 6.2500 mg | INTRAMUSCULAR | Status: DC | PRN

## 2019-10-06 MED ORDER — HYDROCHLOROTHIAZIDE 12.5 MG PO CAPS
12.5000 mg | ORAL_CAPSULE | Freq: Every day | ORAL | Status: DC
  Administered 2019-10-06 – 2019-10-07 (×2): 12.5 mg via ORAL
  Filled 2019-10-06 (×2): qty 1

## 2019-10-06 MED ORDER — FENTANYL CITRATE (PF) 100 MCG/2ML IJ SOLN
INTRAMUSCULAR | Status: DC | PRN
  Administered 2019-10-06: 25 ug via INTRAVENOUS

## 2019-10-06 MED ORDER — SODIUM CHLORIDE 0.9 % IV SOLN: INTRAVENOUS | Status: DC

## 2019-10-06 MED ORDER — TRAZODONE HCL 50 MG PO TABS
25.0000 mg | ORAL_TABLET | Freq: Every day | ORAL | Status: DC
  Administered 2019-10-06: 25 mg via ORAL
  Filled 2019-10-06: qty 1

## 2019-10-06 MED ORDER — ONDANSETRON HCL 4 MG/2ML IJ SOLN
INTRAMUSCULAR | Status: DC | PRN
  Administered 2019-10-06: 4 mg via INTRAVENOUS

## 2019-10-06 MED ORDER — ONDANSETRON HCL 4 MG/2ML IJ SOLN
4.0000 mg | Freq: Four times a day (QID) | INTRAMUSCULAR | Status: DC | PRN
  Administered 2019-10-07: 08:00:00 4 mg via INTRAVENOUS
  Filled 2019-10-06 (×2): qty 2

## 2019-10-06 MED ORDER — DONEPEZIL HCL 10 MG PO TABS
10.0000 mg | ORAL_TABLET | Freq: Every day | ORAL | Status: DC
  Administered 2019-10-06: 10 mg via ORAL
  Filled 2019-10-06: qty 1

## 2019-10-06 MED ORDER — METOCLOPRAMIDE HCL 5 MG PO TABS: 5.0000 mg | ORAL_TABLET | Freq: Three times a day (TID) | ORAL | Status: DC | PRN

## 2019-10-06 MED ORDER — LOSARTAN POTASSIUM-HCTZ 50-12.5 MG PO TABS: 1.0000 | ORAL_TABLET | Freq: Every day | ORAL | Status: DC

## 2019-10-06 MED ORDER — BUSPIRONE HCL 5 MG PO TABS
15.0000 mg | ORAL_TABLET | Freq: Every day | ORAL | Status: DC
  Administered 2019-10-06: 20:00:00 15 mg via ORAL
  Filled 2019-10-06: qty 3

## 2019-10-06 MED ORDER — BUPIVACAINE HCL (PF) 0.75 % IJ SOLN
INTRAMUSCULAR | Status: DC | PRN
  Administered 2019-10-06: 2 mL via INTRATHECAL

## 2019-10-06 MED ORDER — PROPOFOL 500 MG/50ML IV EMUL
INTRAVENOUS | Status: AC
  Filled 2019-10-06: qty 50

## 2019-10-06 MED ORDER — PROPOFOL 10 MG/ML IV BOLUS
INTRAVENOUS | Status: AC
  Filled 2019-10-06: qty 20

## 2019-10-06 MED ORDER — CANAGLIFLOZIN 100 MG PO TABS
100.0000 mg | ORAL_TABLET | Freq: Every day | ORAL | Status: DC
  Administered 2019-10-07: 100 mg via ORAL
  Filled 2019-10-06: qty 1

## 2019-10-06 MED ORDER — CHLORHEXIDINE GLUCONATE 4 % EX LIQD: 60.0000 mL | Freq: Once | CUTANEOUS | Status: DC

## 2019-10-06 MED ORDER — ASPIRIN EC 325 MG PO TBEC
325.0000 mg | DELAYED_RELEASE_TABLET | Freq: Two times a day (BID) | ORAL | Status: DC
  Administered 2019-10-07: 325 mg via ORAL
  Filled 2019-10-06: qty 1

## 2019-10-06 MED ORDER — LACTATED RINGERS IV SOLN: INTRAVENOUS | Status: DC

## 2019-10-06 MED ORDER — METOCLOPRAMIDE HCL 5 MG/ML IJ SOLN
5.0000 mg | Freq: Three times a day (TID) | INTRAMUSCULAR | Status: DC | PRN
  Administered 2019-10-07: 10 mg via INTRAVENOUS
  Filled 2019-10-06: qty 2

## 2019-10-06 MED ORDER — MENTHOL 3 MG MT LOZG: 1.0000 | LOZENGE | OROMUCOSAL | Status: DC | PRN

## 2019-10-06 MED ORDER — PROPOFOL 500 MG/50ML IV EMUL
INTRAVENOUS | Status: DC | PRN
  Administered 2019-10-06: 10 mg via INTRAVENOUS
  Administered 2019-10-06: 20 mg via INTRAVENOUS

## 2019-10-06 MED ORDER — GLIMEPIRIDE 1 MG PO TABS
1.0000 mg | ORAL_TABLET | Freq: Every day | ORAL | Status: DC
  Administered 2019-10-07: 1 mg via ORAL
  Filled 2019-10-06: qty 1

## 2019-10-06 MED ORDER — MORPHINE SULFATE (PF) 2 MG/ML IV SOLN: 0.5000 mg | INTRAVENOUS | Status: DC | PRN

## 2019-10-06 MED ORDER — FAMOTIDINE 20 MG PO TABS
20.0000 mg | ORAL_TABLET | Freq: Two times a day (BID) | ORAL | Status: DC
  Administered 2019-10-06 – 2019-10-07 (×2): 20 mg via ORAL
  Filled 2019-10-06 (×2): qty 1

## 2019-10-06 MED ORDER — PHENYLEPHRINE HCL-NACL 10-0.9 MG/250ML-% IV SOLN
INTRAVENOUS | Status: DC | PRN
  Administered 2019-10-06: 50 ug/min via INTRAVENOUS

## 2019-10-06 MED ORDER — PROPOFOL 500 MG/50ML IV EMUL
INTRAVENOUS | Status: DC | PRN
  Administered 2019-10-06: 50 ug/kg/min via INTRAVENOUS

## 2019-10-06 MED ORDER — HYDROMORPHONE HCL 1 MG/ML IJ SOLN
INTRAMUSCULAR | Status: AC
  Filled 2019-10-06: qty 1

## 2019-10-06 MED ORDER — TRANEXAMIC ACID-NACL 1000-0.7 MG/100ML-% IV SOLN
INTRAVENOUS | Status: AC
  Filled 2019-10-06: qty 100

## 2019-10-06 MED ORDER — SODIUM CHLORIDE (PF) 0.9 % IJ SOLN
INTRAMUSCULAR | Status: AC
  Filled 2019-10-06: qty 50

## 2019-10-06 MED ORDER — PHENOL 1.4 % MT LIQD: 1.0000 | OROMUCOSAL | Status: DC | PRN

## 2019-10-06 MED ORDER — DEXAMETHASONE SODIUM PHOSPHATE 10 MG/ML IJ SOLN
8.0000 mg | Freq: Once | INTRAMUSCULAR | Status: AC
  Administered 2019-10-06: 08:00:00 8 mg via INTRAVENOUS

## 2019-10-06 MED ORDER — ROPIVACAINE HCL 5 MG/ML IJ SOLN
INTRAMUSCULAR | Status: DC | PRN
  Administered 2019-10-06: 30 mL via PERINEURAL

## 2019-10-06 MED ORDER — TRAMADOL HCL 50 MG PO TABS
50.0000 mg | ORAL_TABLET | Freq: Four times a day (QID) | ORAL | Status: DC | PRN
  Administered 2019-10-06 – 2019-10-07 (×3): 50 mg via ORAL
  Filled 2019-10-06 (×3): qty 1

## 2019-10-06 MED ORDER — SIMVASTATIN 20 MG PO TABS: 20.0000 mg | ORAL_TABLET | Freq: Every day | ORAL | Status: DC

## 2019-10-06 MED ORDER — OXYCODONE HCL 5 MG/5ML PO SOLN: 5.0000 mg | Freq: Once | ORAL | Status: DC | PRN

## 2019-10-06 MED ORDER — LOSARTAN POTASSIUM 50 MG PO TABS
50.0000 mg | ORAL_TABLET | Freq: Every day | ORAL | Status: DC
  Administered 2019-10-06 – 2019-10-07 (×2): 50 mg via ORAL
  Filled 2019-10-06 (×2): qty 1

## 2019-10-06 MED ORDER — ATORVASTATIN CALCIUM 10 MG PO TABS
10.0000 mg | ORAL_TABLET | Freq: Every day | ORAL | Status: DC
  Administered 2019-10-06: 18:00:00 10 mg via ORAL
  Filled 2019-10-06: qty 1

## 2019-10-06 MED ORDER — OXYCODONE HCL 5 MG PO TABS
5.0000 mg | ORAL_TABLET | ORAL | Status: DC | PRN
  Administered 2019-10-06: 15:00:00 5 mg via ORAL
  Filled 2019-10-06: qty 1

## 2019-10-06 MED ORDER — DIPHENHYDRAMINE HCL 12.5 MG/5ML PO ELIX: 12.5000 mg | ORAL_SOLUTION | ORAL | Status: DC | PRN

## 2019-10-06 MED ORDER — SODIUM CHLORIDE 0.9 % IR SOLN
Status: DC | PRN
  Administered 2019-10-06: 1000 mL

## 2019-10-06 MED ORDER — ONDANSETRON HCL 4 MG/2ML IJ SOLN
INTRAMUSCULAR | Status: AC
  Filled 2019-10-06: qty 2

## 2019-10-06 MED ORDER — ACETAMINOPHEN 500 MG PO TABS
1000.0000 mg | ORAL_TABLET | Freq: Four times a day (QID) | ORAL | Status: AC
  Administered 2019-10-06 – 2019-10-07 (×4): 1000 mg via ORAL
  Filled 2019-10-06 (×5): qty 2

## 2019-10-06 MED ORDER — DEXAMETHASONE SODIUM PHOSPHATE 10 MG/ML IJ SOLN
10.0000 mg | Freq: Once | INTRAMUSCULAR | Status: AC
  Administered 2019-10-07: 10 mg via INTRAVENOUS
  Filled 2019-10-06: qty 1

## 2019-10-06 MED ORDER — MEMANTINE HCL ER 28 MG PO CP24
28.0000 mg | ORAL_CAPSULE | Freq: Every day | ORAL | Status: DC
  Administered 2019-10-06 – 2019-10-07 (×2): 28 mg via ORAL
  Filled 2019-10-06 (×2): qty 1

## 2019-10-06 MED ORDER — ACETAMINOPHEN 10 MG/ML IV SOLN
INTRAVENOUS | Status: AC
  Filled 2019-10-06: qty 100

## 2019-10-06 MED ORDER — CARVEDILOL 3.125 MG PO TABS
3.1250 mg | ORAL_TABLET | Freq: Two times a day (BID) | ORAL | Status: DC
  Administered 2019-10-06 – 2019-10-07 (×2): 3.125 mg via ORAL
  Filled 2019-10-06 (×2): qty 1

## 2019-10-06 MED ORDER — TRIMETHOPRIM 100 MG PO TABS
100.0000 mg | ORAL_TABLET | Freq: Every day | ORAL | Status: DC
  Administered 2019-10-06: 100 mg via ORAL
  Filled 2019-10-06: qty 1

## 2019-10-06 MED ORDER — HYDROMORPHONE HCL 1 MG/ML IJ SOLN
0.2500 mg | INTRAMUSCULAR | Status: DC | PRN
  Administered 2019-10-06: 0.25 mg via INTRAVENOUS

## 2019-10-06 MED ORDER — OXYCODONE HCL 5 MG PO TABS: 5.0000 mg | ORAL_TABLET | Freq: Once | ORAL | Status: DC | PRN

## 2019-10-06 MED ORDER — CEFAZOLIN SODIUM-DEXTROSE 2-4 GM/100ML-% IV SOLN
INTRAVENOUS | Status: AC
  Filled 2019-10-06: qty 100

## 2019-10-06 MED ORDER — MIDAZOLAM HCL 5 MG/5ML IJ SOLN
INTRAMUSCULAR | Status: DC | PRN
  Administered 2019-10-06: .5 mg via INTRAVENOUS

## 2019-10-06 MED ORDER — LEVOTHYROXINE SODIUM 50 MCG PO TABS
50.0000 ug | ORAL_TABLET | Freq: Every day | ORAL | Status: DC
  Administered 2019-10-07: 50 ug via ORAL
  Filled 2019-10-06: qty 1

## 2019-10-06 MED ORDER — MIDAZOLAM HCL 2 MG/2ML IJ SOLN
INTRAMUSCULAR | Status: AC
  Filled 2019-10-06: qty 2

## 2019-10-06 MED ORDER — DEXAMETHASONE SODIUM PHOSPHATE 10 MG/ML IJ SOLN
INTRAMUSCULAR | Status: AC
  Filled 2019-10-06: qty 1

## 2019-10-06 MED ORDER — BISACODYL 10 MG RE SUPP: 10.0000 mg | Freq: Every day | RECTAL | Status: DC | PRN

## 2019-10-06 MED ORDER — CEFAZOLIN SODIUM-DEXTROSE 2-4 GM/100ML-% IV SOLN
2.0000 g | INTRAVENOUS | Status: AC
  Administered 2019-10-06: 07:00:00 2 g via INTRAVENOUS

## 2019-10-06 MED ORDER — CEFAZOLIN SODIUM-DEXTROSE 2-4 GM/100ML-% IV SOLN
2.0000 g | Freq: Four times a day (QID) | INTRAVENOUS | Status: AC
  Administered 2019-10-06 (×2): 2 g via INTRAVENOUS
  Filled 2019-10-06 (×2): qty 100

## 2019-10-06 MED ORDER — BUPIVACAINE LIPOSOME 1.3 % IJ SUSP
INTRAMUSCULAR | Status: DC | PRN
  Administered 2019-10-06: 20 mL

## 2019-10-06 MED ORDER — PHENYLEPHRINE HCL (PRESSORS) 10 MG/ML IV SOLN
INTRAVENOUS | Status: AC
  Filled 2019-10-06: qty 1

## 2019-10-06 MED ORDER — FLEET ENEMA 7-19 GM/118ML RE ENEM: 1.0000 | ENEMA | Freq: Once | RECTAL | Status: DC | PRN

## 2019-10-06 MED ORDER — POLYETHYLENE GLYCOL 3350 17 G PO PACK: 17.0000 g | PACK | Freq: Every day | ORAL | Status: DC | PRN

## 2019-10-06 MED ORDER — METHOCARBAMOL 500 MG IVPB - SIMPLE MED
INTRAVENOUS | Status: AC
  Filled 2019-10-06: qty 50

## 2019-10-06 MED ORDER — BUSPIRONE HCL 5 MG PO TABS
7.5000 mg | ORAL_TABLET | Freq: Every day | ORAL | Status: DC
  Administered 2019-10-07: 09:00:00 7.5 mg via ORAL
  Filled 2019-10-06: qty 2

## 2019-10-06 MED ORDER — METHOCARBAMOL 500 MG PO TABS: 500.0000 mg | ORAL_TABLET | Freq: Four times a day (QID) | ORAL | Status: DC | PRN

## 2019-10-06 MED ORDER — DOCUSATE SODIUM 100 MG PO CAPS
100.0000 mg | ORAL_CAPSULE | Freq: Two times a day (BID) | ORAL | Status: DC
  Administered 2019-10-06 – 2019-10-07 (×2): 100 mg via ORAL
  Filled 2019-10-06 (×2): qty 1

## 2019-10-06 MED ORDER — METHOCARBAMOL 500 MG IVPB - SIMPLE MED
500.0000 mg | Freq: Four times a day (QID) | INTRAVENOUS | Status: DC | PRN
  Administered 2019-10-06: 09:00:00 500 mg via INTRAVENOUS
  Filled 2019-10-06: qty 50

## 2019-10-06 MED ORDER — TRANEXAMIC ACID-NACL 1000-0.7 MG/100ML-% IV SOLN
1000.0000 mg | INTRAVENOUS | Status: AC
  Administered 2019-10-06: 07:00:00 1000 mg via INTRAVENOUS

## 2019-10-06 MED ORDER — POVIDONE-IODINE 10 % EX SWAB
2.0000 "application " | Freq: Once | CUTANEOUS | Status: AC
  Administered 2019-10-06: 2 via TOPICAL

## 2019-10-06 MED ORDER — STERILE WATER FOR IRRIGATION IR SOLN
Status: DC | PRN
  Administered 2019-10-06: 2000 mL

## 2019-10-06 MED ORDER — ACETAMINOPHEN 10 MG/ML IV SOLN
1000.0000 mg | Freq: Four times a day (QID) | INTRAVENOUS | Status: DC
  Administered 2019-10-06: 08:00:00 1000 mg via INTRAVENOUS

## 2019-10-06 MED ORDER — AMLODIPINE BESYLATE 5 MG PO TABS
2.5000 mg | ORAL_TABLET | Freq: Every day | ORAL | Status: DC
  Administered 2019-10-06 – 2019-10-07 (×2): 2.5 mg via ORAL
  Filled 2019-10-06 (×2): qty 1

## 2019-10-06 MED ORDER — SODIUM CHLORIDE (PF) 0.9 % IJ SOLN
INTRAMUSCULAR | Status: DC | PRN
  Administered 2019-10-06: 60 mL

## 2019-10-06 MED ORDER — FESOTERODINE FUMARATE ER 8 MG PO TB24
8.0000 mg | ORAL_TABLET | Freq: Every day | ORAL | Status: DC
  Administered 2019-10-06 – 2019-10-07 (×2): 8 mg via ORAL
  Filled 2019-10-06 (×2): qty 1

## 2019-10-06 MED ORDER — SODIUM CHLORIDE (PF) 0.9 % IJ SOLN
INTRAMUSCULAR | Status: AC
  Filled 2019-10-06: qty 10

## 2019-10-06 MED ORDER — 0.9 % SODIUM CHLORIDE (POUR BTL) OPTIME
TOPICAL | Status: DC | PRN
  Administered 2019-10-06: 1000 mL

## 2019-10-06 MED ORDER — FENTANYL CITRATE (PF) 100 MCG/2ML IJ SOLN
INTRAMUSCULAR | Status: AC
  Filled 2019-10-06: qty 2

## 2019-10-06 MED ORDER — ONDANSETRON HCL 4 MG PO TABS: 4.0000 mg | ORAL_TABLET | Freq: Four times a day (QID) | ORAL | Status: DC | PRN

## 2019-10-06 SURGICAL SUPPLY — 57 items
ATTUNE PS FEM LT SZ 3 CEM KNEE (Femur) ×1 IMPLANT
ATTUNE PSRP INSR SZ3 8 KNEE (Insert) ×2 IMPLANT
BAG SPEC THK2 15X12 ZIP CLS (MISCELLANEOUS) ×1
BAG ZIPLOCK 12X15 (MISCELLANEOUS) ×2 IMPLANT
BASE TIBIAL ROT PLAT SZ 3 KNEE (Knees) IMPLANT
BLADE SAG 18X100X1.27 (BLADE) ×2 IMPLANT
BLADE SAW SGTL 11.0X1.19X90.0M (BLADE) ×2 IMPLANT
BLADE SURG SZ10 CARB STEEL (BLADE) ×4 IMPLANT
BNDG ELASTIC 6X5.8 VLCR STR LF (GAUZE/BANDAGES/DRESSINGS) ×2 IMPLANT
BOWL SMART MIX CTS (DISPOSABLE) ×2 IMPLANT
BSPLAT TIB 3 CMNT ROT PLAT STR (Knees) ×1 IMPLANT
CEMENT HV SMART SET (Cement) ×4 IMPLANT
COVER SURGICAL LIGHT HANDLE (MISCELLANEOUS) ×2 IMPLANT
COVER WAND RF STERILE (DRAPES) ×1 IMPLANT
CUFF TOURN SGL QUICK 34 (TOURNIQUET CUFF) ×2
CUFF TRNQT CYL 34X4.125X (TOURNIQUET CUFF) ×1 IMPLANT
DECANTER SPIKE VIAL GLASS SM (MISCELLANEOUS) ×2 IMPLANT
DRAPE U-SHAPE 47X51 STRL (DRAPES) ×2 IMPLANT
DRSG AQUACEL AG ADV 3.5X10 (GAUZE/BANDAGES/DRESSINGS) ×2 IMPLANT
DURAPREP 26ML APPLICATOR (WOUND CARE) ×2 IMPLANT
ELECT REM PT RETURN 15FT ADLT (MISCELLANEOUS) ×2 IMPLANT
EVACUATOR 1/8 PVC DRAIN (DRAIN) ×1 IMPLANT
GAUZE SPONGE 2X2 8PLY STRL LF (GAUZE/BANDAGES/DRESSINGS) ×1 IMPLANT
GLOVE BIO SURGEON STRL SZ7 (GLOVE) ×2 IMPLANT
GLOVE BIO SURGEON STRL SZ8 (GLOVE) ×2 IMPLANT
GLOVE BIOGEL PI IND STRL 7.0 (GLOVE) ×1 IMPLANT
GLOVE BIOGEL PI IND STRL 8 (GLOVE) ×1 IMPLANT
GLOVE BIOGEL PI INDICATOR 7.0 (GLOVE) ×1
GLOVE BIOGEL PI INDICATOR 8 (GLOVE) ×1
GOWN STRL REUS W/TWL LRG LVL3 (GOWN DISPOSABLE) ×4 IMPLANT
HANDPIECE INTERPULSE COAX TIP (DISPOSABLE) ×2
HOLDER FOLEY CATH W/STRAP (MISCELLANEOUS) ×1 IMPLANT
IMMOBILIZER KNEE 20 (SOFTGOODS) ×2
IMMOBILIZER KNEE 20 THIGH 36 (SOFTGOODS) ×1 IMPLANT
KIT TURNOVER KIT A (KITS) IMPLANT
MANIFOLD NEPTUNE II (INSTRUMENTS) ×2 IMPLANT
NS IRRIG 1000ML POUR BTL (IV SOLUTION) ×2 IMPLANT
PACK TOTAL KNEE CUSTOM (KITS) ×2 IMPLANT
PADDING CAST COTTON 6X4 STRL (CAST SUPPLIES) ×3 IMPLANT
PATELLA MEDIAL ATTUN 35MM KNEE (Knees) ×1 IMPLANT
PENCIL SMOKE EVACUATOR (MISCELLANEOUS) ×1 IMPLANT
PIN DRILL FIX HALF THREAD (BIT) ×1 IMPLANT
PIN STEINMAN FIXATION KNEE (PIN) ×2 IMPLANT
PROTECTOR NERVE ULNAR (MISCELLANEOUS) ×2 IMPLANT
SET HNDPC FAN SPRY TIP SCT (DISPOSABLE) ×1 IMPLANT
SPONGE GAUZE 2X2 STER 10/PKG (GAUZE/BANDAGES/DRESSINGS) ×1
STRIP CLOSURE SKIN 1/2X4 (GAUZE/BANDAGES/DRESSINGS) ×4 IMPLANT
SUT MNCRL AB 4-0 PS2 18 (SUTURE) ×2 IMPLANT
SUT STRATAFIX 0 PDS 27 VIOLET (SUTURE) ×2
SUT VIC AB 2-0 CT1 27 (SUTURE) ×6
SUT VIC AB 2-0 CT1 TAPERPNT 27 (SUTURE) ×3 IMPLANT
SUTURE STRATFX 0 PDS 27 VIOLET (SUTURE) ×1 IMPLANT
TIBIAL BASE ROT PLAT SZ 3 KNEE (Knees) ×2 IMPLANT
TRAY FOLEY MTR SLVR 14FR STAT (SET/KITS/TRAYS/PACK) ×1 IMPLANT
WATER STERILE IRR 1000ML POUR (IV SOLUTION) ×4 IMPLANT
WRAP KNEE MAXI GEL POST OP (GAUZE/BANDAGES/DRESSINGS) ×2 IMPLANT
YANKAUER SUCT BULB TIP 10FT TU (MISCELLANEOUS) ×2 IMPLANT

## 2019-10-06 NOTE — Interval H&P Note (Signed)
History and Physical Interval Note:  10/06/2019 6:51 AM  Chelsea Roberson  has presented today for surgery, with the diagnosis of left knee osteoarthritis.  The various methods of treatment have been discussed with the patient and family. After consideration of risks, benefits and other options for treatment, the patient has consented to  Procedure(s) with comments: TOTAL KNEE ARTHROPLASTY (Left) - as a surgical intervention.  The patient's history has been reviewed, patient examined, no change in status, stable for surgery.  I have reviewed the patient's chart and labs.  Questions were answered to the patient's satisfaction.     Homero Fellers Zanden Colver

## 2019-10-06 NOTE — Progress Notes (Signed)
Orthopedic Tech Progress Note Patient Details:  Chelsea Roberson 02/20/1946 [id number]  CPM Left Knee CPM Left Knee: Off Left Knee Flexion (Degrees): 40 Left Knee Extension (Degrees): 10 Additional Comments: Trapeze bar and foot roll  Post Interventions Patient Tolerated: Well Instructions Provided: Care of device  Saul Fordyce 10/06/2019, 3:45 PM

## 2019-10-06 NOTE — Anesthesia Postprocedure Evaluation (Signed)
Anesthesia Post Note  Patient: Chelsea Roberson  Procedure(s) Performed: TOTAL KNEE ARTHROPLASTY (Left Knee)     Patient location during evaluation: PACU Anesthesia Type: Spinal Level of consciousness: awake and alert Pain management: pain level controlled Vital Signs Assessment: post-procedure vital signs reviewed and stable Respiratory status: spontaneous breathing, nonlabored ventilation and respiratory function stable Cardiovascular status: blood pressure returned to baseline and stable Postop Assessment: no apparent nausea or vomiting Anesthetic complications: no    Last Vitals:  Vitals:   10/06/19 0915 10/06/19 1000  BP: (!) 142/92 (!) 129/95  Pulse: 64 (!) 58  Resp: 19 17  Temp:    SpO2: 100% 100%    Last Pain:  Vitals:   10/06/19 1000  TempSrc:   PainSc: 3                  Lowella Curb

## 2019-10-06 NOTE — Anesthesia Procedure Notes (Signed)
Spinal  Patient location during procedure: OR Start time: 10/06/2019 7:09 AM End time: 10/06/2019 7:19 AM Staffing Performed: anesthesiologist  Anesthesiologist: Lowella Curb, MD Preanesthetic Checklist Completed: patient identified, IV checked, site marked, risks and benefits discussed, surgical consent, monitors and equipment checked, pre-op evaluation and timeout performed Spinal Block Patient position: sitting Prep: DuraPrep Patient monitoring: heart rate, cardiac monitor, continuous pulse ox and blood pressure Approach: midline Location: L3-4 Injection technique: single-shot Needle Needle type: Quincke  Needle gauge: 22 G Needle length: 9 cm Assessment Sensory level: T4

## 2019-10-06 NOTE — Evaluation (Signed)
Physical Therapy Evaluation Patient Details Name: Chelsea Roberson MRN: [id number] DOB: 1946/01/02 Today's Date: 10/06/2019   History of Present Illness  Patient is 74 y.o. 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     Clinical Impression  MCKINZEE SPIRITO is a 74 y.o. female POD 0 s/p Lt TKA. Patient reports modified independence with SPC for mobility at baseline. Patient is now limited by functional impairments (see PT problem list below) and requires min assist for transfers and gait with RW. Patient was able to ambulate ~6 feet with RW and min assist and was limited by nausea. Patient instructed in exercise to facilitate ROM and circulation. Patient will benefit from continued skilled PT interventions to address impairments and progress towards PLOF. Acute PT will follow to progress mobility and stair training in preparation for safe discharge home.     Follow Up Recommendations Home health PT;Follow surgeon's recommendation for DC plan and follow-up therapies    Equipment Recommendations  None recommended by PT    Recommendations for Other Services       Precautions / Restrictions Precautions Precautions: Fall Restrictions Weight Bearing Restrictions: No LLE Weight Bearing: Weight bearing as tolerated      Mobility  Bed Mobility Overal bed mobility: Needs Assistance Bed Mobility: Supine to Sit     Supine to sit: Min assist;HOB elevated     General bed mobility comments: cues for use of bed rail and assist to bring LE's off EOB.   Transfers Overall transfer level: Needs assistance Equipment used: Rolling walker (2 wheeled) Transfers: Sit to/from Stand Sit to Stand: Min assist         General transfer comment: cues for technique (repeated verbal/tactile cues needed). assist required for power up and to steady with rising and achieve full upright  posture.   Ambulation/Gait Ambulation/Gait assistance: Min assist Gait Distance (Feet): 6 Feet Assistive device: Rolling walker (2 wheeled) Gait Pattern/deviations: Decreased stride length;Step-to pattern;Trunk flexed Gait velocity: decreased   General Gait Details: repeated verbal/tactile cues required for safe step pattern within RW and for step sequencing. pt requried light blocking at Lt knee to prevent buckling. simple one step cues needed for sequencing steps with gait. distance limited by nausea.   Stairs     Wheelchair Mobility    Modified Rankin (Stroke Patients Only)       Balance Overall balance assessment: Needs assistance Sitting-balance support: Feet supported Sitting balance-Leahy Scale: Good     Standing balance support: Bilateral upper extremity supported;During functional activity Standing balance-Leahy Scale: Poor            Pertinent Vitals/Pain Pain Assessment: Faces Faces Pain Scale: Hurts a little bit Pain Location: Lt knee Pain Descriptors / Indicators: Guarding;Grimacing;Sore Pain Intervention(s): Limited activity within patient's tolerance;Monitored during session;Repositioned    Home Living Family/patient expects to be discharged to:: Private residence Living Arrangements: Spouse/significant other Available Help at Discharge: Family;Available 24 hours/day Type of Home: House Home Access: Stairs to enter Entrance Stairs-Rails: Can reach both Entrance Stairs-Number of Steps: 3 Home Layout: One level Home Equipment: Walker - 2 wheels;Cane - single point;Bedside commode;Shower seat - built in;Grab bars - tub/shower      Prior Function Level of Independence: Independent with assistive device(s)         Comments: pt has been using SPC for longer distances     Hand Dominance   Dominant Hand:  Right    Extremity/Trunk Assessment   Upper Extremity Assessment Upper Extremity Assessment: Overall WFL for tasks assessed    Lower  Extremity Assessment Lower Extremity Assessment: Generalized weakness;LLE deficits/detail LLE Deficits / Details: good quad activation, no extensor lag with SLR LLE Sensation: WNL LLE Coordination: WNL    Cervical / Trunk Assessment Cervical / Trunk Assessment: Normal  Communication   Communication: No difficulties  Cognition Arousal/Alertness: Awake/alert Behavior During Therapy: WFL for tasks assessed/performed Overall Cognitive Status: History of cognitive impairments - at baseline        General Comments: pt with history of alzheimers dementia      General Comments      Exercises Total Joint Exercises Ankle Circles/Pumps: AROM;Both;15 reps;Seated Quad Sets: AROM;Left;5 reps;Seated Heel Slides: AROM;Both;5 reps;Seated   Assessment/Plan    PT Assessment Patient needs continued PT services  PT Problem List Decreased strength;Decreased range of motion;Decreased activity tolerance;Decreased balance;Decreased mobility;Decreased cognition;Decreased knowledge of use of DME;Decreased safety awareness;Decreased knowledge of precautions       PT Treatment Interventions DME instruction;Gait training;Stair training;Functional mobility training;Therapeutic activities;Therapeutic exercise;Balance training;Patient/family education    PT Goals (Current goals can be found in the Care Plan section)  Acute Rehab PT Goals Patient Stated Goal: to move better PT Goal Formulation: With patient/family Time For Goal Achievement: 10/13/19 Potential to Achieve Goals: Good    Frequency 7X/week    AM-PAC PT "6 Clicks" Mobility  Outcome Measure Help needed turning from your back to your side while in a flat bed without using bedrails?: A Little Help needed moving from lying on your back to sitting on the side of a flat bed without using bedrails?: A Little Help needed moving to and from a bed to a chair (including a wheelchair)?: A Little Help needed standing up from a chair using your arms  (e.g., wheelchair or bedside chair)?: A Little Help needed to walk in hospital room?: A Little Help needed climbing 3-5 steps with a railing? : A Lot 6 Click Score: 17    End of Session Equipment Utilized During Treatment: Gait belt Activity Tolerance: Patient tolerated treatment well Patient left: in chair;with call bell/phone within reach;with chair alarm set;with family/visitor present Nurse Communication: Mobility status PT Visit Diagnosis: Muscle weakness (generalized) (M62.81);Difficulty in walking, not elsewhere classified (R26.2)    Time: 1435-1510 PT Time Calculation (min) (ACUTE ONLY): 35 min   Charges:   PT Evaluation $PT Eval Moderate Complexity: 1 Mod PT Treatments $Therapeutic Exercise: 8-22 mins       Verner Mould, DPT Physical Therapist with [facility] [phone]  10/06/2019 4:34 PM

## 2019-10-06 NOTE — Discharge Instructions (Addendum)
 Frank Aluisio, MD Total Joint Specialist [facility] [address] [place], [place] [address] [phone]  TOTAL KNEE REPLACEMENT POSTOPERATIVE DIRECTIONS    Knee Rehabilitation, Guidelines Following Surgery  Results after knee surgery are often greatly improved when you follow the exercise, range of motion and muscle strengthening exercises prescribed by your doctor. Safety measures are also important to protect the knee from further injury. If any of these exercises cause you to have increased pain or swelling in your knee joint, decrease the amount until you are comfortable again and slowly increase them. If you have problems or questions, call your caregiver or physical therapist for advice.   BLOOD CLOT PREVENTION . Take a 325 mg Aspirin two times a day for three weeks following surgery. Then take an 81 mg Aspirin once a day for three weeks. Then discontinue Aspirin. . You may resume your vitamins/supplements upon discharge from the hospital. . Do not take any NSAIDs (Advil, Aleve, Ibuprofen, Meloxicam, etc.) until you have discontinued the 325 mg Aspirin.  HOME CARE INSTRUCTIONS  . Remove items at home which could result in a fall. This includes throw rugs or furniture in walking pathways.  . ICE to the affected knee as much as tolerated. Icing helps control swelling. If the swelling is well controlled you will be more comfortable and rehab easier. Continue to use ice on the knee for pain and swelling from surgery. You may notice swelling that will progress down to the foot and ankle. This is normal after surgery. Elevate the leg when you are not up walking on it.    . Continue to use the breathing machine which will help keep your temperature down. It is common for your temperature to cycle up and down following surgery, especially at night when you are not up moving around and exerting yourself. The breathing machine keeps your lungs expanded and your  temperature down. . Do not place pillow under the operative knee, focus on keeping the knee straight while resting  DIET You may resume your previous home diet once you are discharged from the hospital.  DRESSING / WOUND CARE / SHOWERING . Keep your bulky bandage on for 2 days. On the third post-operative day you may remove the Ace bandage and gauze. There is a waterproof adhesive bandage on your skin which will stay in place until your first follow-up appointment. Once you remove this you will not need to place another bandage . You may begin showering 3 days following surgery, but do not submerge the incision under water.  ACTIVITY For the first 5 days, the key is rest and control of pain and swelling . Do your home exercises twice a day starting on post-operative day 3. On the days you go to physical therapy, just do the home exercises once that day. . You should rest, ice and elevate the leg for 50 minutes out of every hour. Get up and walk/stretch for 10 minutes per hour. After 5 days you can increase your activity slowly as tolerated. . Walk with your walker as instructed. Use the walker until you are comfortable transitioning to a cane. Walk with the cane in the opposite hand of the operative leg. You may discontinue the cane once you are comfortable and walking steadily. . Avoid periods of inactivity such as sitting longer than an hour when not asleep. This helps prevent blood clots.  . You may discontinue the knee immobilizer once you are able to perform a straight   leg raise while lying down. . You may resume a sexual relationship in one month or when given the OK by your doctor.  . You may return to work once you are cleared by your doctor.  . Do not drive a car for 6 weeks or until released by your surgeon.  . Do not drive while taking narcotics.  TED HOSE STOCKINGS Wear the elastic stockings on both legs for three weeks following surgery during the day. You may remove them at night  for sleeping.  WEIGHT BEARING Weight bearing as tolerated with assist device (walker, cane, etc) as directed, use it as long as suggested by your surgeon or therapist, typically at least 4-6 weeks.  POSTOPERATIVE CONSTIPATION PROTOCOL Constipation - defined medically as fewer than three stools per week and severe constipation as less than one stool per week.  One of the most common issues patients have following surgery is constipation.  Even if you have a regular bowel pattern at home, your normal regimen is likely to be disrupted due to multiple reasons following surgery.  Combination of anesthesia, postoperative narcotics, change in appetite and fluid intake all can affect your bowels.  In order to avoid complications following surgery, here are some recommendations in order to help you during your recovery period.  . Colace (docusate) - Pick up an over-the-counter form of Colace or another stool softener and take twice a day as long as you are requiring postoperative pain medications.  Take with a full glass of water daily.  If you experience loose stools or diarrhea, hold the colace until you stool forms back up. If your symptoms do not get better within 1 week or if they get worse, check with your doctor. . Dulcolax (bisacodyl) - Pick up over-the-counter and take as directed by the product packaging as needed to assist with the movement of your bowels.  Take with a full glass of water.  Use this product as needed if not relieved by Colace only.  . MiraLax (polyethylene glycol) - Pick up over-the-counter to have on hand. MiraLax is a solution that will increase the amount of water in your bowels to assist with bowel movements.  Take as directed and can mix with a glass of water, juice, soda, coffee, or tea. Take if you go more than two days without a movement. Do not use MiraLax more than once per day. Call your doctor if you are still constipated or irregular after using this medication for 7 days  in a row.  If you continue to have problems with postoperative constipation, please contact the office for further assistance and recommendations.  If you experience "the worst abdominal pain ever" or develop nausea or vomiting, please contact the office immediatly for further recommendations for treatment.  ITCHING If you experience itching with your medications, try taking only a single pain pill, or even half a pain pill at a time.  You can also use Benadryl over the counter for itching or also to help with sleep.   MEDICATIONS See your medication summary on the "After Visit Summary" that the nursing staff will review with you prior to discharge.  You may have some home medications which will be placed on hold until you complete the course of blood thinner medication.  It is important for you to complete the blood thinner medication as prescribed by your surgeon.  Continue your approved medications as instructed at time of discharge.  PRECAUTIONS . If you experience chest pain or shortness of   breath - call 911 immediately for transfer to the hospital emergency department.  . If you develop a fever greater that 101 F, purulent drainage from wound, increased redness or drainage from wound, foul odor from the wound/dressing, or calf pain - CONTACT YOUR SURGEON.                                                   FOLLOW-UP APPOINTMENTS Make sure you keep all of your appointments after your operation with your surgeon and caregivers. You should call the office at the above phone number and make an appointment for approximately two weeks after the date of your surgery or on the date instructed by your surgeon outlined in the "After Visit Summary".  RANGE OF MOTION AND STRENGTHENING EXERCISES  Rehabilitation of the knee is important following a knee injury or an operation. After just a few days of immobilization, the muscles of the thigh which control the knee become weakened and shrink (atrophy). Knee  exercises are designed to build up the tone and strength of the thigh muscles and to improve knee motion. Often times heat used for twenty to thirty minutes before working out will loosen up your tissues and help with improving the range of motion but do not use heat for the first two weeks following surgery. These exercises can be done on a training (exercise) mat, on the floor, on a table or on a bed. Use what ever works the best and is most comfortable for you Knee exercises include:  . Leg Lifts - While your knee is still immobilized in a splint or cast, you can do straight leg raises. Lift the leg to 60 degrees, hold for 3 sec, and slowly lower the leg. Repeat 10-20 times 2-3 times daily. Perform this exercise against resistance later as your knee gets better.  . Quad and Hamstring Sets - Tighten up the muscle on the front of the thigh (Quad) and hold for 5-10 sec. Repeat this 10-20 times hourly. Hamstring sets are done by pushing the foot backward against an object and holding for 5-10 sec. Repeat as with quad sets.   Leg Slides: Lying on your back, slowly slide your foot toward your buttocks, bending your knee up off the floor (only go as far as is comfortable). Then slowly slide your foot back down until your leg is flat on the floor again.  Angel Wings: Lying on your back spread your legs to the side as far apart as you can without causing discomfort.  A rehabilitation program following serious knee injuries can speed recovery and prevent re-injury in the future due to weakened muscles. Contact your doctor or a physical therapist for more information on knee rehabilitation.   IF YOU ARE TRANSFERRED TO A SKILLED REHAB FACILITY If the patient is transferred to a skilled rehab facility following release from the hospital, a list of the current medications will be sent to the facility for the patient to continue.  When discharged from the skilled rehab facility, please have the facility set up the  patient's Home Health Physical Therapy prior to being released. Also, the skilled facility will be responsible for providing the patient with their medications at time of release from the facility to include their pain medication, the muscle relaxants, and their blood thinner medication. If the patient is still at the   rehab facility at time of the two week follow up appointment, the skilled rehab facility will also need to assist the patient in arranging follow up appointment in our office and any transportation needs.  MAKE SURE YOU:  . Understand these instructions.  . Get help right away if you are not doing well or get worse.   DENTAL ANTIBIOTICS:  In most cases prophylactic antibiotics for Dental procdeures after total joint surgery are not necessary.  Exceptions are as follows:  1. History of prior total joint infection  2. Severely immunocompromised (Organ Transplant, cancer chemotherapy, Rheumatoid biologic meds such as Humera)  3. Poorly controlled diabetes (A1C &gt; 8.0, blood glucose over 200)  If you have one of these conditions, contact your surgeon for an antibiotic prescription, prior to your dental procedure.    Pick up stool softner and laxative for home use following surgery while on pain medications. Do not submerge incision under water. Please use good hand washing techniques while changing dressing each day. May shower starting three days after surgery. Please use a clean towel to pat the incision dry following showers. Continue to use ice for pain and swelling after surgery. Do not use any lotions or creams on the incision until instructed by your surgeon.  

## 2019-10-06 NOTE — Anesthesia Procedure Notes (Signed)
Anesthesia Regional Block: Adductor canal block   Pre-Anesthetic Checklist: ,, timeout performed, Correct Patient, Correct Site, Correct Laterality, Correct Procedure, Correct Position, site marked, Risks and benefits discussed,  Surgical consent,  Pre-op evaluation,  At surgeon's request and post-op pain management  Laterality: Left  Prep: chloraprep       Needles:  Injection technique: Single-shot  Needle Type: Stimiplex     Needle Length: 9cm  Needle Gauge: 21     Additional Needles:   Narrative:  Start time: 10/06/2019 7:00 AM End time: 10/06/2019 7:05 AM Injection made incrementally with aspirations every 5 mL.  Performed by: Personally  Anesthesiologist: Lowella Curb, MD

## 2019-10-06 NOTE — Op Note (Signed)
OPERATIVE REPORT-TOTAL KNEE ARTHROPLASTY   Pre-operative diagnosis- Osteoarthritis  Left knee(s)  Post-operative diagnosis- Osteoarthritis Left knee(s)  Procedure-  Left  Total Knee Arthroplasty  Surgeon- Gus Rankin. Chanique Duca, MD  Assistant- Arther Abbott, PA-C   Anesthesia-  Adductor canal block and spinal  EBL-25 mL   Drains Hemovac  Tourniquet time-  Total Tourniquet Time Documented: Thigh (Left) - 31 minutes Total: Thigh (Left) - 31 minutes     Complications- None  Condition-PACU - hemodynamically stable.   Brief Clinical Note  Chelsea Roberson is a 74 y.o. year old female with end stage OA of her left knee with progressively worsening pain and dysfunction. She has constant pain, with activity and at rest and significant functional deficits with difficulties even with ADLs. She has had extensive non-op management including analgesics, injections of cortisone and viscosupplements, and home exercise program, but remains in significant pain with significant dysfunction. Radiographs show bone on bone arthritis medial and patellofemoral. She presents now for left Total Knee Arthroplasty.    Procedure in detail---   The patient is brought into the operating room and positioned supine on the operating table. After successful administration of  Adductor canal block and spinal,   a tourniquet is placed high on the  Left thigh(s) and the lower extremity is prepped and draped in the usual sterile fashion. Time out is performed by the operating team and then the  Left lower extremity is wrapped in Esmarch, knee flexed and the tourniquet inflated to 300 mmHg.       A midline incision is made with a ten blade through the subcutaneous tissue to the level of the extensor mechanism. A fresh blade is used to make a medial parapatellar arthrotomy. Soft tissue over the proximal medial tibia is subperiosteally elevated to the joint line with a knife and into the semimembranosus bursa with a Cobb  elevator. Soft tissue over the proximal lateral tibia is elevated with attention being paid to avoiding the patellar tendon on the tibial tubercle. The patella is everted, knee flexed 90 degrees and the ACL and PCL are removed. Findings are bone on bone medial and patellofemoral        The drill is used to create a starting hole in the distal femur and the canal is thoroughly irrigated with sterile saline to remove the fatty contents. The 5 degree Left  valgus alignment guide is placed into the femoral canal and the distal femoral cutting block is pinned to remove 9 mm off the distal femur. Resection is made with an oscillating saw.      The tibia is subluxed forward and the menisci are removed. The extramedullary alignment guide is placed referencing proximally at the medial aspect of the tibial tubercle and distally along the second metatarsal axis and tibial crest. The block is pinned to remove 8mm off the more deficient medial  side. Resection is made with an oscillating saw. Size 3is the most appropriate size for the tibia and the proximal tibia is prepared with the modular drill and keel punch for that size.      The femoral sizing guide is placed and size 3 is most appropriate. Rotation is marked off the epicondylar axis and confirmed by creating a rectangular flexion gap at 90 degrees. The size 3 cutting block is pinned in this rotation and the anterior, posterior and chamfer cuts are made with the oscillating saw. The intercondylar block is then placed and that cut is made.  Trial size 3 tibial component, trial size 3 posterior stabilized femur and a 8  mm posterior stabilized rotating platform insert trial is placed. Full extension is achieved with excellent varus/valgus and anterior/posterior balance throughout full range of motion. The patella is everted and thickness measured to be 21  mm. Free hand resection is taken to 12 mm, a 35 template is placed, lug holes are drilled, trial patella is  placed, and it tracks normally. Osteophytes are removed off the posterior femur with the trial in place. All trials are removed and the cut bone surfaces prepared with pulsatile lavage. Cement is mixed and once ready for implantation, the size 3 tibial implant, size  3 posterior stabilized femoral component, and the size 35 patella are cemented in place and the patella is held with the clamp. The trial insert is placed and the knee held in full extension. The Exparel (20 ml mixed with 60 ml saline) is injected into the extensor mechanism, posterior capsule, medial and lateral gutters and subcutaneous tissues.  All extruded cement is removed and once the cement is hard the permanent 8 mm posterior stabilized rotating platform insert is placed into the tibial tray.      The wound is copiously irrigated with saline solution and the extensor mechanism closed over a hemovac drain with #1 V-loc suture. The tourniquet is released for a total tourniquet time of 30  minutes. Flexion against gravity is 140 degrees and the patella tracks normally. Subcutaneous tissue is closed with 2.0 vicryl and subcuticular with running 4.0 Monocryl. The incision is cleaned and dried and steri-strips and a bulky sterile dressing are applied. The limb is placed into a knee immobilizer and the patient is awakened and transported to recovery in stable condition.      Please note that a surgical assistant was a medical necessity for this procedure in order to perform it in a safe and expeditious manner. Surgical assistant was necessary to retract the ligaments and vital neurovascular structures to prevent injury to them and also necessary for proper positioning of the limb to allow for anatomic placement of the prosthesis.   Dione Plover Lailani Tool, MD    10/06/2019, 8:16 AM

## 2019-10-06 NOTE — Progress Notes (Signed)
Chelsea Roberson was given permission to stay the night with the pt d/t the pt's cognitive needs. Charge nurse aware.

## 2019-10-06 NOTE — Progress Notes (Signed)
Orthopedic Tech Progress Note Patient Details:  Chelsea Roberson [birth year] [id number]  CPM Left Knee CPM Left Knee: On Left Knee Flexion (Degrees): 40 Left Knee Extension (Degrees): 10 Additional Comments: Trapeze bar and foot roll  Post Interventions Patient Tolerated: Well Instructions Provided: Care of device  Saul Fordyce 10/06/2019, 11:31 AM

## 2019-10-06 NOTE — Transfer of Care (Signed)
Immediate Anesthesia Transfer of Care Note  Patient: Chelsea Roberson  Procedure(s) Performed: TOTAL KNEE ARTHROPLASTY (Left Knee)  Patient Location: PACU  Anesthesia Type:MAC and Spinal  Level of Consciousness: awake, alert , oriented and patient cooperative  Airway & Oxygen Therapy: Patient Spontanous Breathing and Patient connected to face mask oxygen  Post-op Assessment: Report given to RN and Post -op Vital signs reviewed and stable  Post vital signs: Reviewed and stable  Last Vitals:  Vitals Value Taken Time  BP 127/76 10/06/19 0838  Temp    Pulse 55 10/06/19 0840  Resp 21 10/06/19 0841  SpO2 94 % 10/06/19 0840  Vitals shown include unvalidated device data.  Last Pain:  Vitals:   10/06/19 0603  TempSrc: Oral      Patients Stated Pain Goal: 5 (51/88/41 6606)  Complications: No apparent anesthesia complications

## 2019-10-06 NOTE — Anesthesia Procedure Notes (Signed)
Date/Time: 10/06/2019 7:04 AM Performed by: Thornell Mule, CRNA Oxygen Delivery Method: Simple face mask

## 2019-10-06 NOTE — Anesthesia Preprocedure Evaluation (Addendum)
Anesthesia Evaluation  Patient identified by MRN, date of birth, ID band Patient awake    Reviewed: Allergy & Precautions, NPO status , Patient's Chart, lab work & pertinent test results, reviewed documented beta blocker date and time   History of Anesthesia Complications (+) PONV  Airway Mallampati: I  TM Distance: >3 FB Neck ROM: Full    Dental no notable dental hx.    Pulmonary sleep apnea ,    Pulmonary exam normal breath sounds clear to auscultation       Cardiovascular hypertension, Pt. on medications and Pt. on home beta blockers Normal cardiovascular exam Rhythm:Regular Rate:Normal     Neuro/Psych Anxiety Dementia    GI/Hepatic GERD  Medicated and Controlled,  Endo/Other  diabetes, Type 2, Oral Hypoglycemic AgentsHypothyroidism   Renal/GU      Musculoskeletal  (+) Arthritis , Osteoarthritis,    Abdominal   Peds  Hematology   Anesthesia Other Findings   Reproductive/Obstetrics                             Anesthesia Physical  Anesthesia Plan  ASA: III  Anesthesia Plan: Spinal   Post-op Pain Management:  Regional for Post-op pain   Induction: Intravenous  PONV Risk Score and Plan: 3 and Ondansetron, Treatment may vary due to age or medical condition, Dexamethasone and Midazolam  Airway Management Planned: Simple Face Mask  Additional Equipment:   Intra-op Plan:   Post-operative Plan:   Informed Consent: I have reviewed the patients History and Physical, chart, labs and discussed the procedure including the risks, benefits and alternatives for the proposed anesthesia with the patient or authorized representative who has indicated his/her understanding and acceptance.       Plan Discussed with: CRNA and Surgeon  Anesthesia Plan Comments:         Anesthesia Quick Evaluation

## 2019-10-07 ENCOUNTER — Encounter: Payer: Self-pay | Admitting: *Deleted

## 2019-10-07 DIAGNOSIS — M1712 Unilateral primary osteoarthritis, left knee: Secondary | ICD-10-CM | POA: Diagnosis not present

## 2019-10-07 LAB — BASIC METABOLIC PANEL
Anion gap: 8 (ref 5–15)
BUN: 21 mg/dL (ref 8–23)
CO2: 24 mmol/L (ref 22–32)
Calcium: 8.4 mg/dL — ABNORMAL LOW (ref 8.9–10.3)
Chloride: 106 mmol/L (ref 98–111)
Creatinine, Ser: 0.53 mg/dL (ref 0.44–1.00)
GFR calc Af Amer: >60 mL/min (ref >60)
GFR calc non Af Amer: >60 mL/min (ref >60)
Glucose, Bld: 172 mg/dL — ABNORMAL HIGH (ref 70–99)
Potassium: 3.5 mmol/L (ref 3.5–5.1)
Sodium: 138 mmol/L (ref 135–145)

## 2019-10-07 LAB — CBC
HCT: 33.3 % — ABNORMAL LOW (ref 36.0–46.0)
Hemoglobin: 10.6 g/dL — ABNORMAL LOW (ref 12.0–15.0)
MCH: 32.3 pg (ref 26.0–34.0)
MCHC: 31.8 g/dL (ref 30.0–36.0)
MCV: 101.5 fL — ABNORMAL HIGH (ref 80.0–100.0)
Platelets: 174 10*3/uL (ref 150–400)
RBC: 3.28 MIL/uL — ABNORMAL LOW (ref 3.87–5.11)
RDW: 13.3 % (ref 11.5–15.5)
WBC: 11.6 10*3/uL — ABNORMAL HIGH (ref 4.0–10.5)
nRBC: 0 % (ref 0.0–0.2)

## 2019-10-07 MED ORDER — TRAMADOL HCL 50 MG PO TABS: 50.0000 mg | ORAL_TABLET | Freq: Four times a day (QID) | ORAL | 0 refills | Status: AC | PRN

## 2019-10-07 MED ORDER — OXYCODONE HCL 5 MG PO TABS: 5.0000 mg | ORAL_TABLET | Freq: Four times a day (QID) | ORAL | 0 refills | Status: AC | PRN

## 2019-10-07 MED ORDER — METHOCARBAMOL 500 MG PO TABS: 500.0000 mg | ORAL_TABLET | Freq: Four times a day (QID) | ORAL | 0 refills | Status: AC | PRN

## 2019-10-07 MED ORDER — ASPIRIN 325 MG PO TBEC: 325.0000 mg | DELAYED_RELEASE_TABLET | Freq: Two times a day (BID) | ORAL | 0 refills | Status: AC

## 2019-10-07 NOTE — Progress Notes (Signed)
Physical Therapy Treatment Patient Details Name: Chelsea Roberson MRN: [id number] DOB: Aug 03, 1945 Today's Date: 10/07/2019    History of Present Illness Patient is 74 y.o. 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     PT Comments    Pt assisted with exercises and then ambulated in hallway.  Pt requiring multimodal cues and max cues for technique likely due to hx dementia.  Will return to practice steps this afternoon.  Follow Up Recommendations  Home health PT;Follow surgeon's recommendation for DC plan and follow-up therapies     Equipment Recommendations  None recommended by PT    Recommendations for Other Services       Precautions / Restrictions Precautions Precautions: Fall;Knee Restrictions LLE Weight Bearing: Weight bearing as tolerated    Mobility  Bed Mobility               General bed mobility comments: pt in recliner on arrival  Transfers Overall transfer level: Needs assistance Equipment used: Rolling walker (2 wheeled) Transfers: Sit to/from Stand Sit to Stand: Min assist         General transfer comment: verbal cues for UE and LE positioning, multimodal cues provided, assist to rise and steady as well as control descent  Ambulation/Gait Ambulation/Gait assistance: Min guard Gait Distance (Feet): 80 Feet Assistive device: Rolling walker (2 wheeled) Gait Pattern/deviations: Decreased stride length;Step-to pattern;Trunk flexed     General Gait Details: verbal cues for sequence, RW positioning, step length, max cues for remaining within RW   Stairs             Wheelchair Mobility    Modified Rankin (Stroke Patients Only)       Balance                                            Cognition Arousal/Alertness: Awake/alert Behavior During Therapy: WFL for tasks assessed/performed Overall  Cognitive Status: History of cognitive impairments - at baseline                                 General Comments: pt with history of alzheimers dementia      Exercises Total Joint Exercises Ankle Circles/Pumps: AROM;Both;10 reps Quad Sets: AROM;Left;10 reps Heel Slides: AAROM;10 reps;Left Hip ABduction/ADduction: AAROM;Left;10 reps Straight Leg Raises: AAROM;Left;10 reps    General Comments        Pertinent Vitals/Pain Pain Assessment: Faces Faces Pain Scale: Hurts little more Pain Location: Lt knee Pain Descriptors / Indicators: Guarding;Grimacing;Sore Pain Intervention(s): Repositioned;Monitored during session    Home Living                      Prior Function            PT Goals (current goals can now be found in the care plan section) Progress towards PT goals: Progressing toward goals    Frequency    7X/week      PT Plan Current plan remains appropriate    Co-evaluation              AM-PAC PT "6 Clicks" Mobility   Outcome Measure  Help needed turning from your back to your side while in a flat bed without  using bedrails?: A Little Help needed moving from lying on your back to sitting on the side of a flat bed without using bedrails?: A Little Help needed moving to and from a bed to a chair (including a wheelchair)?: A Little Help needed standing up from a chair using your arms (e.g., wheelchair or bedside chair)?: A Little Help needed to walk in hospital room?: A Little Help needed climbing 3-5 steps with a railing? : A Lot 6 Click Score: 17    End of Session Equipment Utilized During Treatment: Gait belt Activity Tolerance: Patient tolerated treatment well Patient left: in chair;with call bell/phone within reach;with chair alarm set;with family/visitor present Nurse Communication: Mobility status PT Visit Diagnosis: Muscle weakness (generalized) (M62.81);Difficulty in walking, not elsewhere classified (R26.2)      Time: 1030-1051 PT Time Calculation (min) (ACUTE ONLY): 21 min  Charges:  $Therapeutic Exercise: 8-22 mins                     Paulino Door, DPT Acute Rehabilitation Services Office: [phone]  Sarajane Jews 10/07/2019, 2:56 PM

## 2019-10-07 NOTE — Progress Notes (Signed)
   Subjective: 1 Day Post-Op Procedure(s) (LRB): TOTAL KNEE ARTHROPLASTY (Left) Patient reports pain as mild.   Patient seen in rounds by Dr. Lequita Halt. Patient is well, and has had no acute complaints or problems other than pain in the left knee. Denies chest pain or SOB. No issues overnight. Foley catheter to be removed this AM. We will continue therapy today.   Objective: Vital signs in last 24 hours: Temp:  [97.4 F (36.3 C)-99 F (37.2 C)] 99 F (37.2 C) (04/27 0530) Pulse Rate:  [57-105] 74 (04/27 0530) Resp:  [14-19] 18 (04/27 0530) BP: (125-163)/(55-96) 126/60 (04/27 0530) SpO2:  [96 %-100 %] 98 % (04/27 0530)  Intake/Output from previous day:  Intake/Output Summary (Last 24 hours) at 10/07/2019 0733 Last data filed at 10/07/2019 0200 Gross per 24 hour  Intake 3179.85 ml  Output 3525 ml  Net -345.15 ml     Intake/Output this shift: No intake/output data recorded.  Labs: Recent Labs    10/07/19 0250  HGB 10.6*   Recent Labs    10/07/19 0250  WBC 11.6*  RBC 3.28*  HCT 33.3*  PLT 174   Recent Labs    10/07/19 0250  NA 138  K 3.5  CL 106  CO2 24  BUN 21  CREATININE 0.53  GLUCOSE 172*  CALCIUM 8.4*   No results for input(s): LABPT, INR in the last 72 hours.  Exam: General - Patient is Alert and Oriented Extremity - Neurologically intact Neurovascular intact Sensation intact distally Dorsiflexion/Plantar flexion intact Dressing - dressing C/D/I Motor Function - intact, moving foot and toes well on exam.   Past Medical History:  Diagnosis Date  . Anxiety   . Arthritis    back, Knees  . Closed right hip fracture, initial encounter (HCC) 05/14/2018  . Complication of anesthesia   . Dementia (HCC)   . DM (diabetes mellitus), type 2 (HCC)   . GERD (gastroesophageal reflux disease)   . Hyperlipidemia   . Hypertension   . Hypothyroidism   . Memory loss   . PONV (postoperative nausea and vomiting)     Assessment/Plan: 1 Day Post-Op  Procedure(s) (LRB): TOTAL KNEE ARTHROPLASTY (Left) Principal Problem:   OA (osteoarthritis) of knee Active Problems:   Primary osteoarthritis of left knee  Estimated body mass index is 28.2 kg/m as calculated from the following:   Height as of this encounter: 5\' 2"  (1.575 m).   Weight as of this encounter: 69.9 kg. Advance diet Up with therapy D/C IV fluids  Anticipated LOS equal to or greater than 2 midnights due to - Age 25 and older with one or more of the following:  - Obesity  - Expected need for hospital services (PT, OT, Nursing) required for safe  discharge  - Anticipated need for postoperative skilled nursing care or inpatient rehab  - Active co-morbidities: Diabetes and Dementia OR   - Unanticipated findings during/Post Surgery: None  - Patient is a high risk of re-admission due to: None    DVT Prophylaxis - Aspirin Weight bearing as tolerated. D/C O2 and pulse ox and try on room air. Hemovac pulled without difficulty, will continue therapy today.  Plan is to go Home after hospital stay. Plan for discharge later today if progressing with therapy and meeting goals. Scheduled for outpatient physical therapy at [facility] in [place]. Follow-up in the office in 2 weeks.   76, PA-C Orthopedic Surgery [phone] 10/07/2019, 7:33 AM

## 2019-10-07 NOTE — Progress Notes (Signed)
Physical Therapy Treatment Patient Details Name: Chelsea Roberson MRN: [id number] DOB: January 10, 1946 Today's Date: 10/07/2019    History of Present Illness Patient is 74 y.o. 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     PT Comments    Pt ambulated to steps and then practiced safe stair technique.   Pt performed twice and spouse observed.  Spouse reports family will be assisting pt return home as well.  Provided HEP handout and answered spouse's questions regarding exercises.  Spouse and pt eager for d/c home today.  Pt would benefit from HHPT prior to OP PT (RN notified).    Follow Up Recommendations  Home health PT;Follow surgeon's recommendation for DC plan and follow-up therapies     Equipment Recommendations  None recommended by PT    Recommendations for Other Services       Precautions / Restrictions Precautions Precautions: Fall;Knee Restrictions LLE Weight Bearing: Weight bearing as tolerated    Mobility  Bed Mobility               General bed mobility comments: pt in recliner on arrival  Transfers Overall transfer level: Needs assistance Equipment used: Rolling walker (2 wheeled) Transfers: Sit to/from Stand Sit to Stand: Min assist         General transfer comment: verbal cues for UE and LE positioning, multimodal cues provided, assist to rise and steady  Ambulation/Gait Ambulation/Gait assistance: Min guard Gait Distance (Feet): 35 Feet Assistive device: Rolling walker (2 wheeled) Gait Pattern/deviations: Decreased stride length;Step-to pattern;Trunk flexed Gait velocity: decreased   General Gait Details: verbal cues for sequence, RW positioning, step length, max cues for remaining within RW   Stairs Stairs: Yes Stairs assistance: Min guard;Min assist Stair Management: Step to pattern;Forwards;Two rails Number of Stairs:  2 General stair comments: multimodal cues for sequencing and safety; pt performed twice, spouse observed   Wheelchair Mobility    Modified Rankin (Stroke Patients Only)       Balance                                            Cognition Arousal/Alertness: Awake/alert Behavior During Therapy: WFL for tasks assessed/performed Overall Cognitive Status: History of cognitive impairments - at baseline                                 General Comments: pt with history of alzheimers dementia      Exercises    General Comments        Pertinent Vitals/Pain Pain Assessment: Faces Faces Pain Scale: Hurts even more Pain Location: Lt knee Pain Descriptors / Indicators: Guarding;Grimacing;Sore Pain Intervention(s): Repositioned;Monitored during session    Home Living                      Prior Function            PT Goals (current goals can now be found in the care plan section) Progress towards PT goals: Progressing toward goals    Frequency    7X/week      PT Plan Current plan remains appropriate    Co-evaluation              AM-PAC  PT "6 Clicks" Mobility   Outcome Measure  Help needed turning from your back to your side while in a flat bed without using bedrails?: A Little Help needed moving from lying on your back to sitting on the side of a flat bed without using bedrails?: A Little Help needed moving to and from a bed to a chair (including a wheelchair)?: A Little Help needed standing up from a chair using your arms (e.g., wheelchair or bedside chair)?: A Little Help needed to walk in hospital room?: A Little Help needed climbing 3-5 steps with a railing? : A Little 6 Click Score: 18    End of Session Equipment Utilized During Treatment: Gait belt Activity Tolerance: Patient tolerated treatment well Patient left: in chair;with call bell/phone within reach;with chair alarm set;with family/visitor present Nurse  Communication: Mobility status PT Visit Diagnosis: Muscle weakness (generalized) (M62.81);Difficulty in walking, not elsewhere classified (R26.2)     Time: 3662-9476 PT Time Calculation (min) (ACUTE ONLY): 20 min  Charges:  $Gait Training: 8-22 mins                    Arlyce Dice, DPT Acute Rehabilitation Services Office: [phone]  Chelsea Roberson 10/07/2019, 3:02 PM

## 2019-10-07 NOTE — TOC Progression Note (Signed)
Transition of Care [facility]) - Progression Note    Patient Details  Name: Chelsea Roberson MRN: [id number] Date of Birth: Jan 11, 1946  Transition of Care [facility]) CM/SW Contact  Clearance Coots, LCSW Phone Number: 10/07/2019, 9:46 AM  Clinical Narrative:    Therapy Plan: OPPT [facility] [place] Spouse confirm the patient has RW and 3 in 1   Expected Discharge Plan: OP Rehab Barriers to Discharge: No Barriers Identified  Expected Discharge Plan and Services Expected Discharge Plan: OP Rehab         Expected Discharge Date: 10/07/19               DME Arranged: N/A DME Agency: NA       HH Arranged: NA HH Agency: NA         Social Determinants of Health (SDOH) Interventions    Readmission Risk Interventions No flowsheet data found.

## 2019-10-13 DIAGNOSIS — M6281 Muscle weakness (generalized): Secondary | ICD-10-CM | POA: Diagnosis not present

## 2019-10-13 DIAGNOSIS — M25562 Pain in left knee: Secondary | ICD-10-CM | POA: Diagnosis not present

## 2019-10-13 DIAGNOSIS — M25662 Stiffness of left knee, not elsewhere classified: Secondary | ICD-10-CM | POA: Diagnosis not present

## 2019-10-13 DIAGNOSIS — R262 Difficulty in walking, not elsewhere classified: Secondary | ICD-10-CM | POA: Diagnosis not present

## 2019-10-15 DIAGNOSIS — M6281 Muscle weakness (generalized): Secondary | ICD-10-CM | POA: Diagnosis not present

## 2019-10-15 DIAGNOSIS — M25562 Pain in left knee: Secondary | ICD-10-CM | POA: Diagnosis not present

## 2019-10-15 DIAGNOSIS — M25662 Stiffness of left knee, not elsewhere classified: Secondary | ICD-10-CM | POA: Diagnosis not present

## 2019-10-15 DIAGNOSIS — R262 Difficulty in walking, not elsewhere classified: Secondary | ICD-10-CM | POA: Diagnosis not present

## 2019-10-17 DIAGNOSIS — M6281 Muscle weakness (generalized): Secondary | ICD-10-CM | POA: Diagnosis not present

## 2019-10-17 DIAGNOSIS — M25562 Pain in left knee: Secondary | ICD-10-CM | POA: Diagnosis not present

## 2019-10-17 DIAGNOSIS — M25662 Stiffness of left knee, not elsewhere classified: Secondary | ICD-10-CM | POA: Diagnosis not present

## 2019-10-17 DIAGNOSIS — R262 Difficulty in walking, not elsewhere classified: Secondary | ICD-10-CM | POA: Diagnosis not present

## 2019-10-20 DIAGNOSIS — M25562 Pain in left knee: Secondary | ICD-10-CM | POA: Diagnosis not present

## 2019-10-20 DIAGNOSIS — R262 Difficulty in walking, not elsewhere classified: Secondary | ICD-10-CM | POA: Diagnosis not present

## 2019-10-20 DIAGNOSIS — M25662 Stiffness of left knee, not elsewhere classified: Secondary | ICD-10-CM | POA: Diagnosis not present

## 2019-10-20 DIAGNOSIS — M6281 Muscle weakness (generalized): Secondary | ICD-10-CM | POA: Diagnosis not present

## 2019-10-22 DIAGNOSIS — M25562 Pain in left knee: Secondary | ICD-10-CM | POA: Diagnosis not present

## 2019-10-22 DIAGNOSIS — M25662 Stiffness of left knee, not elsewhere classified: Secondary | ICD-10-CM | POA: Diagnosis not present

## 2019-10-22 DIAGNOSIS — M6281 Muscle weakness (generalized): Secondary | ICD-10-CM | POA: Diagnosis not present

## 2019-10-22 DIAGNOSIS — R262 Difficulty in walking, not elsewhere classified: Secondary | ICD-10-CM | POA: Diagnosis not present

## 2019-10-24 DIAGNOSIS — M6281 Muscle weakness (generalized): Secondary | ICD-10-CM | POA: Diagnosis not present

## 2019-10-24 DIAGNOSIS — M25562 Pain in left knee: Secondary | ICD-10-CM | POA: Diagnosis not present

## 2019-10-24 DIAGNOSIS — M25662 Stiffness of left knee, not elsewhere classified: Secondary | ICD-10-CM | POA: Diagnosis not present

## 2019-10-24 DIAGNOSIS — R262 Difficulty in walking, not elsewhere classified: Secondary | ICD-10-CM | POA: Diagnosis not present

## 2019-10-28 DIAGNOSIS — M25562 Pain in left knee: Secondary | ICD-10-CM | POA: Diagnosis not present

## 2019-10-28 DIAGNOSIS — M25662 Stiffness of left knee, not elsewhere classified: Secondary | ICD-10-CM | POA: Diagnosis not present

## 2019-10-28 DIAGNOSIS — M6281 Muscle weakness (generalized): Secondary | ICD-10-CM | POA: Diagnosis not present

## 2019-10-28 DIAGNOSIS — R262 Difficulty in walking, not elsewhere classified: Secondary | ICD-10-CM | POA: Diagnosis not present

## 2019-10-29 ENCOUNTER — Other Ambulatory Visit: Payer: Self-pay

## 2019-10-29 ENCOUNTER — Encounter (INDEPENDENT_AMBULATORY_CARE_PROVIDER_SITE_OTHER): Payer: Medicare PPO | Admitting: Ophthalmology

## 2019-10-29 DIAGNOSIS — E11311 Type 2 diabetes mellitus with unspecified diabetic retinopathy with macular edema: Secondary | ICD-10-CM

## 2019-10-29 DIAGNOSIS — H35033 Hypertensive retinopathy, bilateral: Secondary | ICD-10-CM

## 2019-10-29 DIAGNOSIS — I10 Essential (primary) hypertension: Secondary | ICD-10-CM | POA: Diagnosis not present

## 2019-10-29 DIAGNOSIS — H43813 Vitreous degeneration, bilateral: Secondary | ICD-10-CM

## 2019-10-29 DIAGNOSIS — E113313 Type 2 diabetes mellitus with moderate nonproliferative diabetic retinopathy with macular edema, bilateral: Secondary | ICD-10-CM | POA: Diagnosis not present

## 2019-10-31 DIAGNOSIS — M25562 Pain in left knee: Secondary | ICD-10-CM | POA: Diagnosis not present

## 2019-10-31 DIAGNOSIS — M25662 Stiffness of left knee, not elsewhere classified: Secondary | ICD-10-CM | POA: Diagnosis not present

## 2019-10-31 DIAGNOSIS — R262 Difficulty in walking, not elsewhere classified: Secondary | ICD-10-CM | POA: Diagnosis not present

## 2019-10-31 DIAGNOSIS — M6281 Muscle weakness (generalized): Secondary | ICD-10-CM | POA: Diagnosis not present

## 2019-11-05 DIAGNOSIS — R262 Difficulty in walking, not elsewhere classified: Secondary | ICD-10-CM | POA: Diagnosis not present

## 2019-11-05 DIAGNOSIS — M25562 Pain in left knee: Secondary | ICD-10-CM | POA: Diagnosis not present

## 2019-11-05 DIAGNOSIS — M25662 Stiffness of left knee, not elsewhere classified: Secondary | ICD-10-CM | POA: Diagnosis not present

## 2019-11-05 DIAGNOSIS — M6281 Muscle weakness (generalized): Secondary | ICD-10-CM | POA: Diagnosis not present

## 2019-11-07 DIAGNOSIS — M25662 Stiffness of left knee, not elsewhere classified: Secondary | ICD-10-CM | POA: Diagnosis not present

## 2019-11-07 DIAGNOSIS — M6281 Muscle weakness (generalized): Secondary | ICD-10-CM | POA: Diagnosis not present

## 2019-11-07 DIAGNOSIS — R262 Difficulty in walking, not elsewhere classified: Secondary | ICD-10-CM | POA: Diagnosis not present

## 2019-11-07 DIAGNOSIS — M25562 Pain in left knee: Secondary | ICD-10-CM | POA: Diagnosis not present

## 2019-11-11 DIAGNOSIS — Z96652 Presence of left artificial knee joint: Secondary | ICD-10-CM | POA: Diagnosis not present

## 2019-11-11 DIAGNOSIS — Z471 Aftercare following joint replacement surgery: Secondary | ICD-10-CM | POA: Diagnosis not present

## 2019-11-12 DIAGNOSIS — M25662 Stiffness of left knee, not elsewhere classified: Secondary | ICD-10-CM | POA: Diagnosis not present

## 2019-11-12 DIAGNOSIS — R262 Difficulty in walking, not elsewhere classified: Secondary | ICD-10-CM | POA: Diagnosis not present

## 2019-11-12 DIAGNOSIS — M6281 Muscle weakness (generalized): Secondary | ICD-10-CM | POA: Diagnosis not present

## 2019-11-12 DIAGNOSIS — M25562 Pain in left knee: Secondary | ICD-10-CM | POA: Diagnosis not present

## 2019-11-14 DIAGNOSIS — R262 Difficulty in walking, not elsewhere classified: Secondary | ICD-10-CM | POA: Diagnosis not present

## 2019-11-14 DIAGNOSIS — M6281 Muscle weakness (generalized): Secondary | ICD-10-CM | POA: Diagnosis not present

## 2019-11-14 DIAGNOSIS — M25562 Pain in left knee: Secondary | ICD-10-CM | POA: Diagnosis not present

## 2019-11-14 DIAGNOSIS — M25662 Stiffness of left knee, not elsewhere classified: Secondary | ICD-10-CM | POA: Diagnosis not present

## 2019-11-18 DIAGNOSIS — M25562 Pain in left knee: Secondary | ICD-10-CM | POA: Diagnosis not present

## 2019-11-18 DIAGNOSIS — R262 Difficulty in walking, not elsewhere classified: Secondary | ICD-10-CM | POA: Diagnosis not present

## 2019-11-18 DIAGNOSIS — M25662 Stiffness of left knee, not elsewhere classified: Secondary | ICD-10-CM | POA: Diagnosis not present

## 2019-11-18 DIAGNOSIS — M6281 Muscle weakness (generalized): Secondary | ICD-10-CM | POA: Diagnosis not present

## 2019-11-20 DIAGNOSIS — M25562 Pain in left knee: Secondary | ICD-10-CM | POA: Diagnosis not present

## 2019-11-20 DIAGNOSIS — M6281 Muscle weakness (generalized): Secondary | ICD-10-CM | POA: Diagnosis not present

## 2019-11-20 DIAGNOSIS — M25662 Stiffness of left knee, not elsewhere classified: Secondary | ICD-10-CM | POA: Diagnosis not present

## 2019-11-20 DIAGNOSIS — R262 Difficulty in walking, not elsewhere classified: Secondary | ICD-10-CM | POA: Diagnosis not present

## 2019-11-25 DIAGNOSIS — R350 Frequency of micturition: Secondary | ICD-10-CM | POA: Diagnosis not present

## 2019-12-03 ENCOUNTER — Encounter (INDEPENDENT_AMBULATORY_CARE_PROVIDER_SITE_OTHER): Payer: Medicare PPO | Admitting: Ophthalmology

## 2019-12-03 ENCOUNTER — Other Ambulatory Visit: Payer: Self-pay

## 2019-12-03 DIAGNOSIS — E11311 Type 2 diabetes mellitus with unspecified diabetic retinopathy with macular edema: Secondary | ICD-10-CM | POA: Diagnosis not present

## 2019-12-03 DIAGNOSIS — E113313 Type 2 diabetes mellitus with moderate nonproliferative diabetic retinopathy with macular edema, bilateral: Secondary | ICD-10-CM | POA: Diagnosis not present

## 2019-12-03 DIAGNOSIS — I10 Essential (primary) hypertension: Secondary | ICD-10-CM

## 2019-12-03 DIAGNOSIS — H43813 Vitreous degeneration, bilateral: Secondary | ICD-10-CM

## 2019-12-03 DIAGNOSIS — H35033 Hypertensive retinopathy, bilateral: Secondary | ICD-10-CM

## 2019-12-08 DIAGNOSIS — E7800 Pure hypercholesterolemia, unspecified: Secondary | ICD-10-CM | POA: Diagnosis not present

## 2019-12-08 DIAGNOSIS — I700 Atherosclerosis of aorta: Secondary | ICD-10-CM | POA: Diagnosis not present

## 2019-12-08 DIAGNOSIS — E083299 Diabetes mellitus due to underlying condition with mild nonproliferative diabetic retinopathy without macular edema, unspecified eye: Secondary | ICD-10-CM | POA: Diagnosis not present

## 2019-12-08 DIAGNOSIS — Z0000 Encounter for general adult medical examination without abnormal findings: Secondary | ICD-10-CM | POA: Diagnosis not present

## 2019-12-08 DIAGNOSIS — Z79899 Other long term (current) drug therapy: Secondary | ICD-10-CM | POA: Diagnosis not present

## 2019-12-08 DIAGNOSIS — E042 Nontoxic multinodular goiter: Secondary | ICD-10-CM | POA: Diagnosis not present

## 2019-12-08 DIAGNOSIS — I10 Essential (primary) hypertension: Secondary | ICD-10-CM | POA: Diagnosis not present

## 2019-12-08 DIAGNOSIS — G301 Alzheimer's disease with late onset: Secondary | ICD-10-CM | POA: Diagnosis not present

## 2019-12-08 DIAGNOSIS — E039 Hypothyroidism, unspecified: Secondary | ICD-10-CM | POA: Diagnosis not present

## 2019-12-08 DIAGNOSIS — Z1389 Encounter for screening for other disorder: Secondary | ICD-10-CM | POA: Diagnosis not present

## 2019-12-23 DIAGNOSIS — R350 Frequency of micturition: Secondary | ICD-10-CM | POA: Diagnosis not present

## 2020-01-07 ENCOUNTER — Other Ambulatory Visit: Payer: Self-pay

## 2020-01-07 ENCOUNTER — Encounter (INDEPENDENT_AMBULATORY_CARE_PROVIDER_SITE_OTHER): Payer: Medicare PPO | Admitting: Ophthalmology

## 2020-01-07 DIAGNOSIS — I10 Essential (primary) hypertension: Secondary | ICD-10-CM | POA: Diagnosis not present

## 2020-01-07 DIAGNOSIS — E113313 Type 2 diabetes mellitus with moderate nonproliferative diabetic retinopathy with macular edema, bilateral: Secondary | ICD-10-CM

## 2020-01-07 DIAGNOSIS — H35033 Hypertensive retinopathy, bilateral: Secondary | ICD-10-CM | POA: Diagnosis not present

## 2020-01-07 DIAGNOSIS — E11311 Type 2 diabetes mellitus with unspecified diabetic retinopathy with macular edema: Secondary | ICD-10-CM | POA: Diagnosis not present

## 2020-01-07 DIAGNOSIS — H43813 Vitreous degeneration, bilateral: Secondary | ICD-10-CM

## 2020-01-20 DIAGNOSIS — R350 Frequency of micturition: Secondary | ICD-10-CM | POA: Diagnosis not present

## 2020-02-11 ENCOUNTER — Other Ambulatory Visit: Payer: Self-pay

## 2020-02-11 ENCOUNTER — Encounter (INDEPENDENT_AMBULATORY_CARE_PROVIDER_SITE_OTHER): Payer: Medicare PPO | Admitting: Ophthalmology

## 2020-02-11 DIAGNOSIS — I10 Essential (primary) hypertension: Secondary | ICD-10-CM

## 2020-02-11 DIAGNOSIS — E11311 Type 2 diabetes mellitus with unspecified diabetic retinopathy with macular edema: Secondary | ICD-10-CM

## 2020-02-11 DIAGNOSIS — H35033 Hypertensive retinopathy, bilateral: Secondary | ICD-10-CM

## 2020-02-11 DIAGNOSIS — E113313 Type 2 diabetes mellitus with moderate nonproliferative diabetic retinopathy with macular edema, bilateral: Secondary | ICD-10-CM | POA: Diagnosis not present

## 2020-02-11 DIAGNOSIS — H43813 Vitreous degeneration, bilateral: Secondary | ICD-10-CM | POA: Diagnosis not present

## 2020-02-17 DIAGNOSIS — R350 Frequency of micturition: Secondary | ICD-10-CM | POA: Diagnosis not present

## 2020-03-10 ENCOUNTER — Encounter (INDEPENDENT_AMBULATORY_CARE_PROVIDER_SITE_OTHER): Payer: Medicare PPO | Admitting: Ophthalmology

## 2020-03-10 ENCOUNTER — Other Ambulatory Visit: Payer: Self-pay

## 2020-03-10 DIAGNOSIS — E11311 Type 2 diabetes mellitus with unspecified diabetic retinopathy with macular edema: Secondary | ICD-10-CM | POA: Diagnosis not present

## 2020-03-10 DIAGNOSIS — I10 Essential (primary) hypertension: Secondary | ICD-10-CM | POA: Diagnosis not present

## 2020-03-10 DIAGNOSIS — H35033 Hypertensive retinopathy, bilateral: Secondary | ICD-10-CM | POA: Diagnosis not present

## 2020-03-10 DIAGNOSIS — H43813 Vitreous degeneration, bilateral: Secondary | ICD-10-CM

## 2020-03-10 DIAGNOSIS — E113313 Type 2 diabetes mellitus with moderate nonproliferative diabetic retinopathy with macular edema, bilateral: Secondary | ICD-10-CM | POA: Diagnosis not present

## 2020-03-12 DIAGNOSIS — N3946 Mixed incontinence: Secondary | ICD-10-CM | POA: Diagnosis not present

## 2020-03-12 DIAGNOSIS — R350 Frequency of micturition: Secondary | ICD-10-CM | POA: Diagnosis not present

## 2020-03-16 DIAGNOSIS — R350 Frequency of micturition: Secondary | ICD-10-CM | POA: Diagnosis not present

## 2020-03-17 DIAGNOSIS — Z23 Encounter for immunization: Secondary | ICD-10-CM | POA: Diagnosis not present

## 2020-03-17 DIAGNOSIS — R350 Frequency of micturition: Secondary | ICD-10-CM | POA: Diagnosis not present

## 2020-03-22 DIAGNOSIS — Z1231 Encounter for screening mammogram for malignant neoplasm of breast: Secondary | ICD-10-CM | POA: Diagnosis not present

## 2020-03-23 DIAGNOSIS — F0280 Dementia in other diseases classified elsewhere without behavioral disturbance: Secondary | ICD-10-CM | POA: Diagnosis not present

## 2020-03-23 DIAGNOSIS — G301 Alzheimer's disease with late onset: Secondary | ICD-10-CM | POA: Diagnosis not present

## 2020-03-23 DIAGNOSIS — I10 Essential (primary) hypertension: Secondary | ICD-10-CM | POA: Diagnosis not present

## 2020-03-23 DIAGNOSIS — M545 Low back pain, unspecified: Secondary | ICD-10-CM | POA: Diagnosis not present

## 2020-04-04 DIAGNOSIS — K4090 Unilateral inguinal hernia, without obstruction or gangrene, not specified as recurrent: Secondary | ICD-10-CM | POA: Diagnosis not present

## 2020-04-04 DIAGNOSIS — R350 Frequency of micturition: Secondary | ICD-10-CM | POA: Diagnosis not present

## 2020-04-05 ENCOUNTER — Other Ambulatory Visit: Payer: Self-pay | Admitting: Family Medicine

## 2020-04-05 DIAGNOSIS — R102 Pelvic and perineal pain: Secondary | ICD-10-CM

## 2020-04-07 ENCOUNTER — Ambulatory Visit
Admission: RE | Admit: 2020-04-07 | Discharge: 2020-04-07 | Disposition: A | Discharge status: Home / Self Care | Payer: Medicare PPO | Source: Ambulatory Visit | Attending: Family Medicine | Admitting: Family Medicine

## 2020-04-07 DIAGNOSIS — R1030 Lower abdominal pain, unspecified: Secondary | ICD-10-CM | POA: Diagnosis not present

## 2020-04-07 DIAGNOSIS — R102 Pelvic and perineal pain: Secondary | ICD-10-CM

## 2020-04-09 ENCOUNTER — Encounter (INDEPENDENT_AMBULATORY_CARE_PROVIDER_SITE_OTHER): Payer: Medicare PPO | Admitting: Ophthalmology

## 2020-04-13 DIAGNOSIS — R350 Frequency of micturition: Secondary | ICD-10-CM | POA: Diagnosis not present

## 2020-04-14 ENCOUNTER — Other Ambulatory Visit: Payer: Self-pay

## 2020-04-14 ENCOUNTER — Encounter (INDEPENDENT_AMBULATORY_CARE_PROVIDER_SITE_OTHER): Payer: Medicare PPO | Admitting: Ophthalmology

## 2020-04-14 DIAGNOSIS — I10 Essential (primary) hypertension: Secondary | ICD-10-CM | POA: Diagnosis not present

## 2020-04-14 DIAGNOSIS — E113313 Type 2 diabetes mellitus with moderate nonproliferative diabetic retinopathy with macular edema, bilateral: Secondary | ICD-10-CM | POA: Diagnosis not present

## 2020-04-14 DIAGNOSIS — E11311 Type 2 diabetes mellitus with unspecified diabetic retinopathy with macular edema: Secondary | ICD-10-CM | POA: Diagnosis not present

## 2020-04-14 DIAGNOSIS — H35033 Hypertensive retinopathy, bilateral: Secondary | ICD-10-CM

## 2020-04-14 DIAGNOSIS — H43813 Vitreous degeneration, bilateral: Secondary | ICD-10-CM

## 2020-05-11 DIAGNOSIS — R300 Dysuria: Secondary | ICD-10-CM | POA: Diagnosis not present

## 2020-05-11 DIAGNOSIS — R350 Frequency of micturition: Secondary | ICD-10-CM | POA: Diagnosis not present

## 2020-05-19 ENCOUNTER — Encounter (INDEPENDENT_AMBULATORY_CARE_PROVIDER_SITE_OTHER): Payer: Medicare PPO | Admitting: Ophthalmology

## 2020-05-19 ENCOUNTER — Other Ambulatory Visit: Payer: Self-pay

## 2020-05-19 DIAGNOSIS — I10 Essential (primary) hypertension: Secondary | ICD-10-CM

## 2020-05-19 DIAGNOSIS — E11311 Type 2 diabetes mellitus with unspecified diabetic retinopathy with macular edema: Secondary | ICD-10-CM | POA: Diagnosis not present

## 2020-05-19 DIAGNOSIS — E113313 Type 2 diabetes mellitus with moderate nonproliferative diabetic retinopathy with macular edema, bilateral: Secondary | ICD-10-CM | POA: Diagnosis not present

## 2020-05-19 DIAGNOSIS — H35033 Hypertensive retinopathy, bilateral: Secondary | ICD-10-CM

## 2020-05-19 DIAGNOSIS — H43813 Vitreous degeneration, bilateral: Secondary | ICD-10-CM | POA: Diagnosis not present

## 2020-06-08 DIAGNOSIS — R350 Frequency of micturition: Secondary | ICD-10-CM | POA: Diagnosis not present

## 2020-06-09 DIAGNOSIS — Z79899 Other long term (current) drug therapy: Secondary | ICD-10-CM | POA: Diagnosis not present

## 2020-06-09 DIAGNOSIS — I10 Essential (primary) hypertension: Secondary | ICD-10-CM | POA: Diagnosis not present

## 2020-06-09 DIAGNOSIS — G301 Alzheimer's disease with late onset: Secondary | ICD-10-CM | POA: Diagnosis not present

## 2020-06-09 DIAGNOSIS — E11311 Type 2 diabetes mellitus with unspecified diabetic retinopathy with macular edema: Secondary | ICD-10-CM | POA: Diagnosis not present

## 2020-06-09 DIAGNOSIS — F411 Generalized anxiety disorder: Secondary | ICD-10-CM | POA: Diagnosis not present

## 2020-06-09 DIAGNOSIS — Z7984 Long term (current) use of oral hypoglycemic drugs: Secondary | ICD-10-CM | POA: Diagnosis not present

## 2020-06-09 DIAGNOSIS — I700 Atherosclerosis of aorta: Secondary | ICD-10-CM | POA: Diagnosis not present

## 2020-06-09 DIAGNOSIS — F0280 Dementia in other diseases classified elsewhere without behavioral disturbance: Secondary | ICD-10-CM | POA: Diagnosis not present

## 2020-06-23 ENCOUNTER — Encounter (INDEPENDENT_AMBULATORY_CARE_PROVIDER_SITE_OTHER): Payer: Medicare PPO | Admitting: Ophthalmology

## 2020-06-23 ENCOUNTER — Other Ambulatory Visit: Payer: Self-pay

## 2020-06-23 DIAGNOSIS — H43813 Vitreous degeneration, bilateral: Secondary | ICD-10-CM | POA: Diagnosis not present

## 2020-06-23 DIAGNOSIS — H35033 Hypertensive retinopathy, bilateral: Secondary | ICD-10-CM

## 2020-06-23 DIAGNOSIS — E113313 Type 2 diabetes mellitus with moderate nonproliferative diabetic retinopathy with macular edema, bilateral: Secondary | ICD-10-CM

## 2020-06-23 DIAGNOSIS — I10 Essential (primary) hypertension: Secondary | ICD-10-CM | POA: Diagnosis not present

## 2020-07-06 DIAGNOSIS — R350 Frequency of micturition: Secondary | ICD-10-CM | POA: Diagnosis not present

## 2020-07-06 DIAGNOSIS — N3946 Mixed incontinence: Secondary | ICD-10-CM | POA: Diagnosis not present

## 2020-07-19 DIAGNOSIS — H6122 Impacted cerumen, left ear: Secondary | ICD-10-CM | POA: Diagnosis not present

## 2020-07-21 DIAGNOSIS — K6289 Other specified diseases of anus and rectum: Secondary | ICD-10-CM | POA: Diagnosis not present

## 2020-07-28 ENCOUNTER — Other Ambulatory Visit: Payer: Self-pay

## 2020-07-28 ENCOUNTER — Encounter (INDEPENDENT_AMBULATORY_CARE_PROVIDER_SITE_OTHER): Payer: Medicare PPO | Admitting: Ophthalmology

## 2020-07-28 DIAGNOSIS — E113313 Type 2 diabetes mellitus with moderate nonproliferative diabetic retinopathy with macular edema, bilateral: Secondary | ICD-10-CM | POA: Diagnosis not present

## 2020-07-28 DIAGNOSIS — H35033 Hypertensive retinopathy, bilateral: Secondary | ICD-10-CM | POA: Diagnosis not present

## 2020-07-28 DIAGNOSIS — H43813 Vitreous degeneration, bilateral: Secondary | ICD-10-CM

## 2020-07-28 DIAGNOSIS — I10 Essential (primary) hypertension: Secondary | ICD-10-CM

## 2020-08-03 DIAGNOSIS — R350 Frequency of micturition: Secondary | ICD-10-CM | POA: Diagnosis not present

## 2020-08-16 DIAGNOSIS — B372 Candidiasis of skin and nail: Secondary | ICD-10-CM | POA: Diagnosis not present

## 2020-08-16 DIAGNOSIS — I10 Essential (primary) hypertension: Secondary | ICD-10-CM | POA: Diagnosis not present

## 2020-08-31 DIAGNOSIS — R350 Frequency of micturition: Secondary | ICD-10-CM | POA: Diagnosis not present

## 2020-08-31 DIAGNOSIS — N3946 Mixed incontinence: Secondary | ICD-10-CM | POA: Diagnosis not present

## 2020-09-01 ENCOUNTER — Other Ambulatory Visit: Payer: Self-pay

## 2020-09-01 ENCOUNTER — Encounter (INDEPENDENT_AMBULATORY_CARE_PROVIDER_SITE_OTHER): Payer: Medicare PPO | Admitting: Ophthalmology

## 2020-09-01 DIAGNOSIS — H35033 Hypertensive retinopathy, bilateral: Secondary | ICD-10-CM | POA: Diagnosis not present

## 2020-09-01 DIAGNOSIS — E113313 Type 2 diabetes mellitus with moderate nonproliferative diabetic retinopathy with macular edema, bilateral: Secondary | ICD-10-CM

## 2020-09-01 DIAGNOSIS — I10 Essential (primary) hypertension: Secondary | ICD-10-CM

## 2020-09-01 DIAGNOSIS — H43813 Vitreous degeneration, bilateral: Secondary | ICD-10-CM | POA: Diagnosis not present

## 2020-09-07 DIAGNOSIS — L304 Erythema intertrigo: Secondary | ICD-10-CM | POA: Diagnosis not present

## 2020-09-15 DIAGNOSIS — N3946 Mixed incontinence: Secondary | ICD-10-CM | POA: Diagnosis not present

## 2020-09-15 DIAGNOSIS — R300 Dysuria: Secondary | ICD-10-CM | POA: Diagnosis not present

## 2020-09-28 DIAGNOSIS — N3946 Mixed incontinence: Secondary | ICD-10-CM | POA: Diagnosis not present

## 2020-09-28 DIAGNOSIS — R350 Frequency of micturition: Secondary | ICD-10-CM | POA: Diagnosis not present

## 2020-10-06 ENCOUNTER — Encounter (INDEPENDENT_AMBULATORY_CARE_PROVIDER_SITE_OTHER): Payer: Medicare PPO | Admitting: Ophthalmology

## 2020-10-06 ENCOUNTER — Other Ambulatory Visit: Payer: Self-pay

## 2020-10-06 DIAGNOSIS — I10 Essential (primary) hypertension: Secondary | ICD-10-CM | POA: Diagnosis not present

## 2020-10-06 DIAGNOSIS — H35033 Hypertensive retinopathy, bilateral: Secondary | ICD-10-CM | POA: Diagnosis not present

## 2020-10-06 DIAGNOSIS — H43813 Vitreous degeneration, bilateral: Secondary | ICD-10-CM

## 2020-10-06 DIAGNOSIS — E113313 Type 2 diabetes mellitus with moderate nonproliferative diabetic retinopathy with macular edema, bilateral: Secondary | ICD-10-CM | POA: Diagnosis not present

## 2020-10-11 DIAGNOSIS — E083299 Diabetes mellitus due to underlying condition with mild nonproliferative diabetic retinopathy without macular edema, unspecified eye: Secondary | ICD-10-CM | POA: Diagnosis not present

## 2020-10-11 DIAGNOSIS — M25561 Pain in right knee: Secondary | ICD-10-CM | POA: Diagnosis not present

## 2020-10-11 DIAGNOSIS — M25562 Pain in left knee: Secondary | ICD-10-CM | POA: Diagnosis not present

## 2020-10-11 DIAGNOSIS — G8929 Other chronic pain: Secondary | ICD-10-CM | POA: Diagnosis not present

## 2020-10-11 DIAGNOSIS — I10 Essential (primary) hypertension: Secondary | ICD-10-CM | POA: Diagnosis not present

## 2020-10-26 DIAGNOSIS — N3946 Mixed incontinence: Secondary | ICD-10-CM | POA: Diagnosis not present

## 2020-10-26 DIAGNOSIS — R350 Frequency of micturition: Secondary | ICD-10-CM | POA: Diagnosis not present

## 2020-11-10 ENCOUNTER — Encounter (INDEPENDENT_AMBULATORY_CARE_PROVIDER_SITE_OTHER): Payer: Medicare PPO | Admitting: Ophthalmology

## 2020-11-10 ENCOUNTER — Other Ambulatory Visit: Payer: Self-pay

## 2020-11-10 DIAGNOSIS — E113313 Type 2 diabetes mellitus with moderate nonproliferative diabetic retinopathy with macular edema, bilateral: Secondary | ICD-10-CM | POA: Diagnosis not present

## 2020-11-10 DIAGNOSIS — H35033 Hypertensive retinopathy, bilateral: Secondary | ICD-10-CM | POA: Diagnosis not present

## 2020-11-10 DIAGNOSIS — H43813 Vitreous degeneration, bilateral: Secondary | ICD-10-CM | POA: Diagnosis not present

## 2020-11-10 DIAGNOSIS — I10 Essential (primary) hypertension: Secondary | ICD-10-CM

## 2020-11-23 DIAGNOSIS — N3946 Mixed incontinence: Secondary | ICD-10-CM | POA: Diagnosis not present

## 2020-11-23 DIAGNOSIS — R350 Frequency of micturition: Secondary | ICD-10-CM | POA: Diagnosis not present

## 2020-12-06 DIAGNOSIS — G301 Alzheimer's disease with late onset: Secondary | ICD-10-CM | POA: Diagnosis not present

## 2020-12-06 DIAGNOSIS — F0280 Dementia in other diseases classified elsewhere without behavioral disturbance: Secondary | ICD-10-CM | POA: Diagnosis not present

## 2020-12-06 DIAGNOSIS — I10 Essential (primary) hypertension: Secondary | ICD-10-CM | POA: Diagnosis not present

## 2020-12-06 DIAGNOSIS — N390 Urinary tract infection, site not specified: Secondary | ICD-10-CM | POA: Diagnosis not present

## 2020-12-08 ENCOUNTER — Other Ambulatory Visit: Payer: Self-pay

## 2020-12-08 ENCOUNTER — Encounter (INDEPENDENT_AMBULATORY_CARE_PROVIDER_SITE_OTHER): Payer: Medicare PPO | Admitting: Ophthalmology

## 2020-12-08 DIAGNOSIS — E113313 Type 2 diabetes mellitus with moderate nonproliferative diabetic retinopathy with macular edema, bilateral: Secondary | ICD-10-CM | POA: Diagnosis not present

## 2020-12-08 DIAGNOSIS — H43813 Vitreous degeneration, bilateral: Secondary | ICD-10-CM | POA: Diagnosis not present

## 2020-12-08 DIAGNOSIS — H35033 Hypertensive retinopathy, bilateral: Secondary | ICD-10-CM

## 2020-12-08 DIAGNOSIS — I10 Essential (primary) hypertension: Secondary | ICD-10-CM | POA: Diagnosis not present

## 2020-12-21 DIAGNOSIS — R350 Frequency of micturition: Secondary | ICD-10-CM | POA: Diagnosis not present

## 2020-12-24 DIAGNOSIS — Z79899 Other long term (current) drug therapy: Secondary | ICD-10-CM | POA: Diagnosis not present

## 2020-12-24 DIAGNOSIS — E7800 Pure hypercholesterolemia, unspecified: Secondary | ICD-10-CM | POA: Diagnosis not present

## 2020-12-24 DIAGNOSIS — E083299 Diabetes mellitus due to underlying condition with mild nonproliferative diabetic retinopathy without macular edema, unspecified eye: Secondary | ICD-10-CM | POA: Diagnosis not present

## 2020-12-24 DIAGNOSIS — Z0000 Encounter for general adult medical examination without abnormal findings: Secondary | ICD-10-CM | POA: Diagnosis not present

## 2020-12-24 DIAGNOSIS — I700 Atherosclerosis of aorta: Secondary | ICD-10-CM | POA: Diagnosis not present

## 2020-12-24 DIAGNOSIS — G301 Alzheimer's disease with late onset: Secondary | ICD-10-CM | POA: Diagnosis not present

## 2020-12-24 DIAGNOSIS — E039 Hypothyroidism, unspecified: Secondary | ICD-10-CM | POA: Diagnosis not present

## 2020-12-24 DIAGNOSIS — Z1389 Encounter for screening for other disorder: Secondary | ICD-10-CM | POA: Diagnosis not present

## 2020-12-24 DIAGNOSIS — E042 Nontoxic multinodular goiter: Secondary | ICD-10-CM | POA: Diagnosis not present

## 2020-12-24 DIAGNOSIS — E11311 Type 2 diabetes mellitus with unspecified diabetic retinopathy with macular edema: Secondary | ICD-10-CM | POA: Diagnosis not present

## 2020-12-24 DIAGNOSIS — I10 Essential (primary) hypertension: Secondary | ICD-10-CM | POA: Diagnosis not present

## 2020-12-24 DIAGNOSIS — F0280 Dementia in other diseases classified elsewhere without behavioral disturbance: Secondary | ICD-10-CM | POA: Diagnosis not present

## 2021-01-05 ENCOUNTER — Encounter (INDEPENDENT_AMBULATORY_CARE_PROVIDER_SITE_OTHER): Payer: Medicare PPO | Admitting: Ophthalmology

## 2021-01-05 ENCOUNTER — Other Ambulatory Visit: Payer: Self-pay

## 2021-01-05 DIAGNOSIS — I10 Essential (primary) hypertension: Secondary | ICD-10-CM | POA: Diagnosis not present

## 2021-01-05 DIAGNOSIS — E113313 Type 2 diabetes mellitus with moderate nonproliferative diabetic retinopathy with macular edema, bilateral: Secondary | ICD-10-CM

## 2021-01-05 DIAGNOSIS — H35033 Hypertensive retinopathy, bilateral: Secondary | ICD-10-CM | POA: Diagnosis not present

## 2021-01-05 DIAGNOSIS — H43813 Vitreous degeneration, bilateral: Secondary | ICD-10-CM | POA: Diagnosis not present

## 2021-01-18 DIAGNOSIS — R350 Frequency of micturition: Secondary | ICD-10-CM | POA: Diagnosis not present

## 2021-02-09 ENCOUNTER — Other Ambulatory Visit: Payer: Self-pay

## 2021-02-09 ENCOUNTER — Encounter (INDEPENDENT_AMBULATORY_CARE_PROVIDER_SITE_OTHER): Payer: Medicare PPO | Admitting: Ophthalmology

## 2021-02-09 DIAGNOSIS — H35033 Hypertensive retinopathy, bilateral: Secondary | ICD-10-CM | POA: Diagnosis not present

## 2021-02-09 DIAGNOSIS — I10 Essential (primary) hypertension: Secondary | ICD-10-CM

## 2021-02-09 DIAGNOSIS — H43813 Vitreous degeneration, bilateral: Secondary | ICD-10-CM

## 2021-02-09 DIAGNOSIS — E113313 Type 2 diabetes mellitus with moderate nonproliferative diabetic retinopathy with macular edema, bilateral: Secondary | ICD-10-CM | POA: Diagnosis not present

## 2021-02-15 DIAGNOSIS — R350 Frequency of micturition: Secondary | ICD-10-CM | POA: Diagnosis not present

## 2021-02-15 DIAGNOSIS — L570 Actinic keratosis: Secondary | ICD-10-CM | POA: Diagnosis not present

## 2021-02-16 DIAGNOSIS — N3946 Mixed incontinence: Secondary | ICD-10-CM | POA: Diagnosis not present

## 2021-02-16 IMAGING — DX DG FEMUR 2+V PORT*R*
4 series · 4 of 4 positions shown · non-contrast
Comparison: Plain films right hip 05/14/2018 and intraoperative
imaging right hip 05/15/2018.

CLINICAL DATA: The patient suffered a right intertrochanteric
fracture in a fall 05/14/2018 and underwent subsequent internal
fixation 05/15/2018. Status post removal of a screw from the femoral
component today. Initial encounter.

EXAM:
RIGHT FEMUR PORTABLE 2 VIEW

[femur ap (1 of 2)]
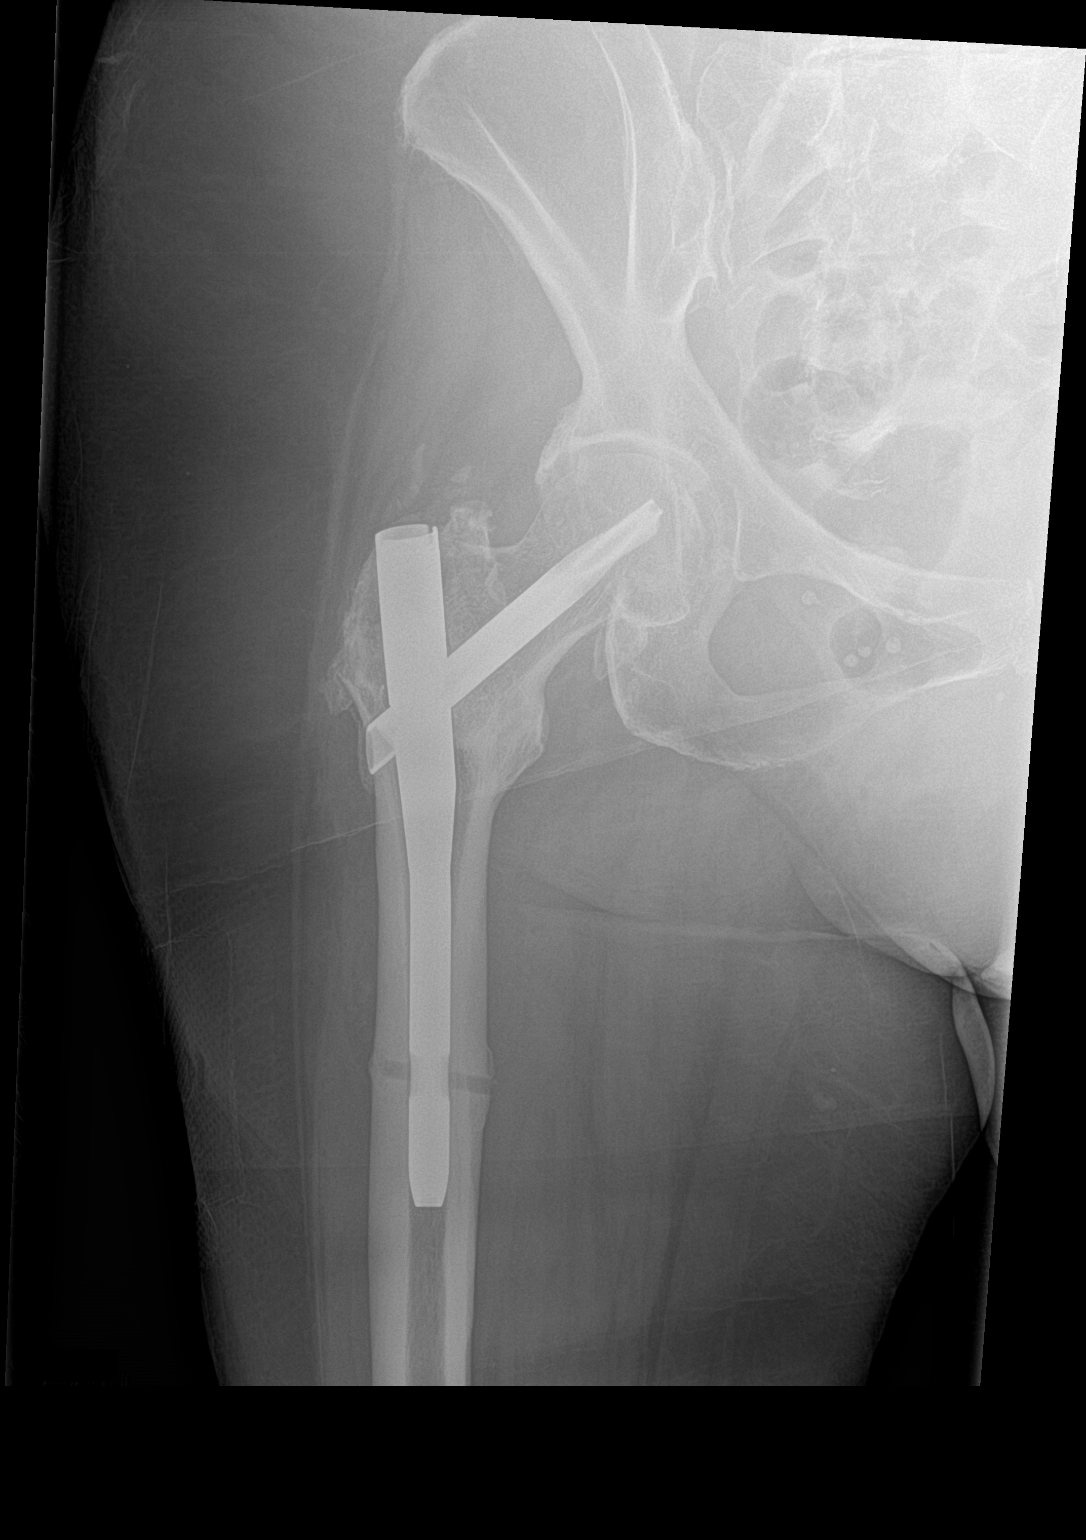

[femur ap (2 of 2)]
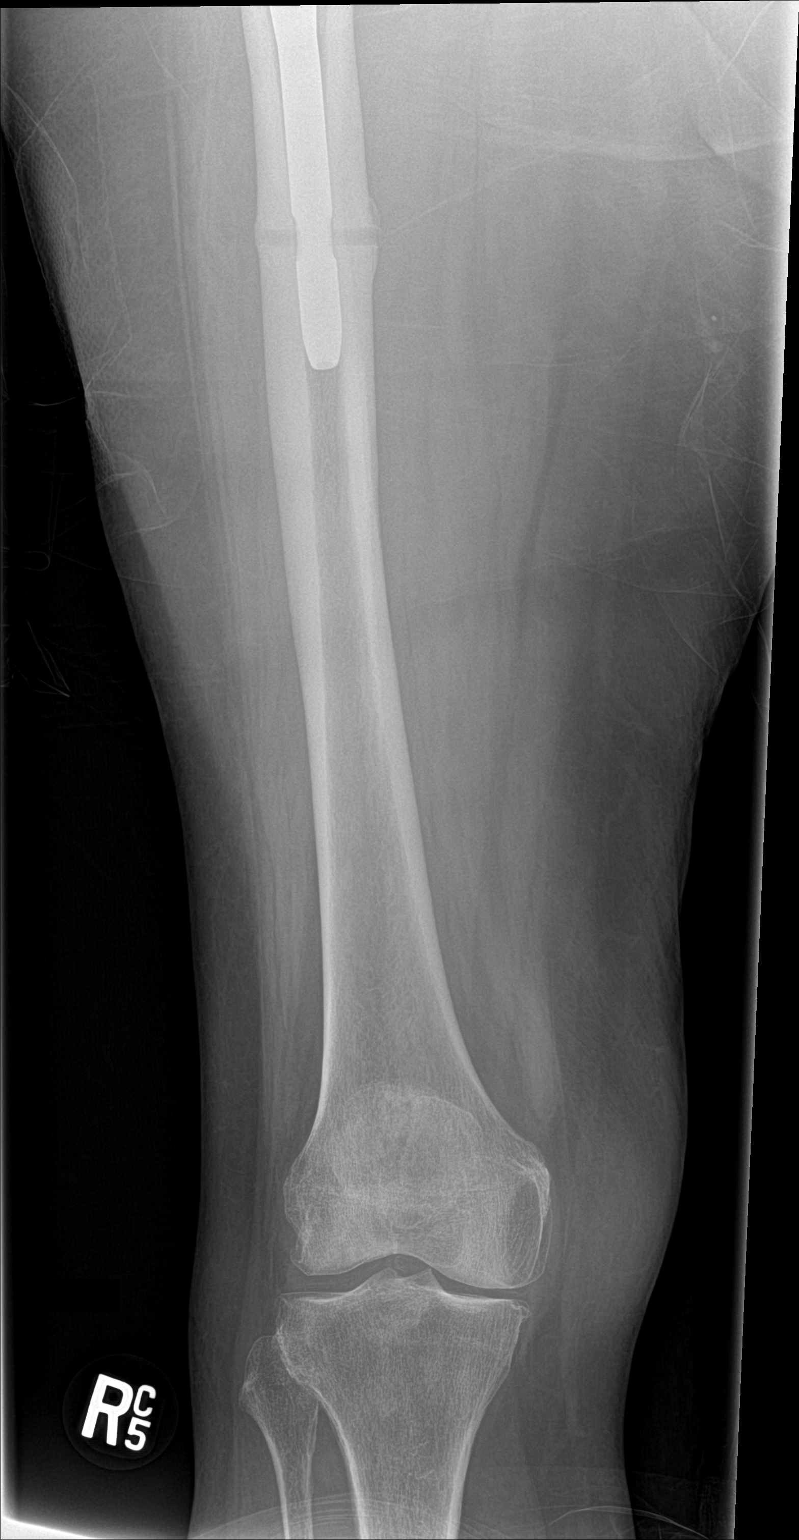

[femur lat]
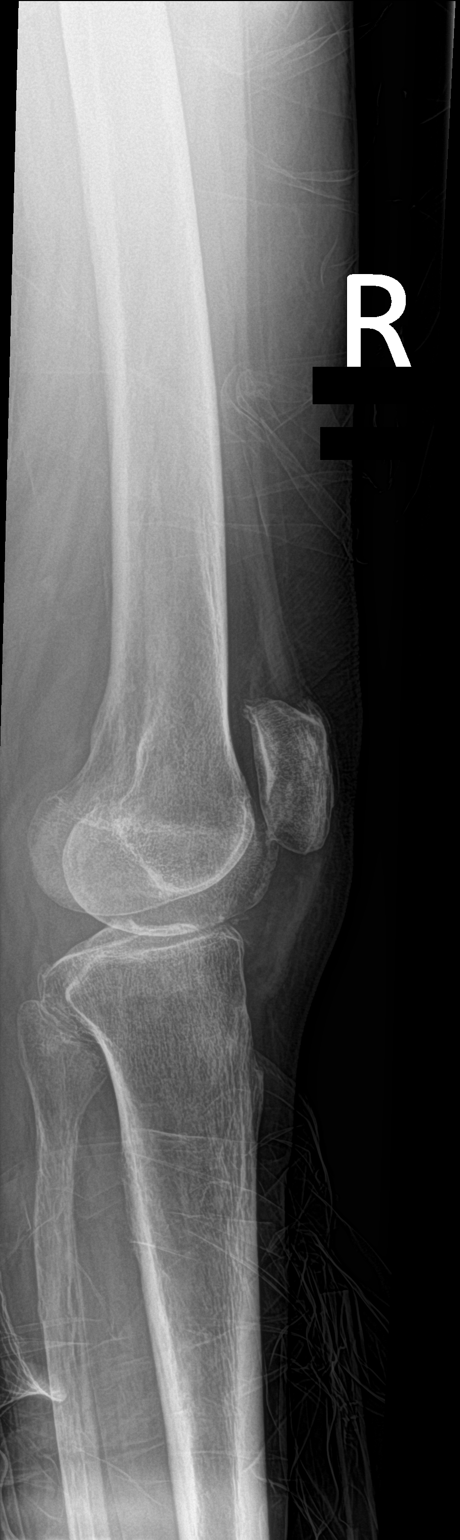

[hip x-table]
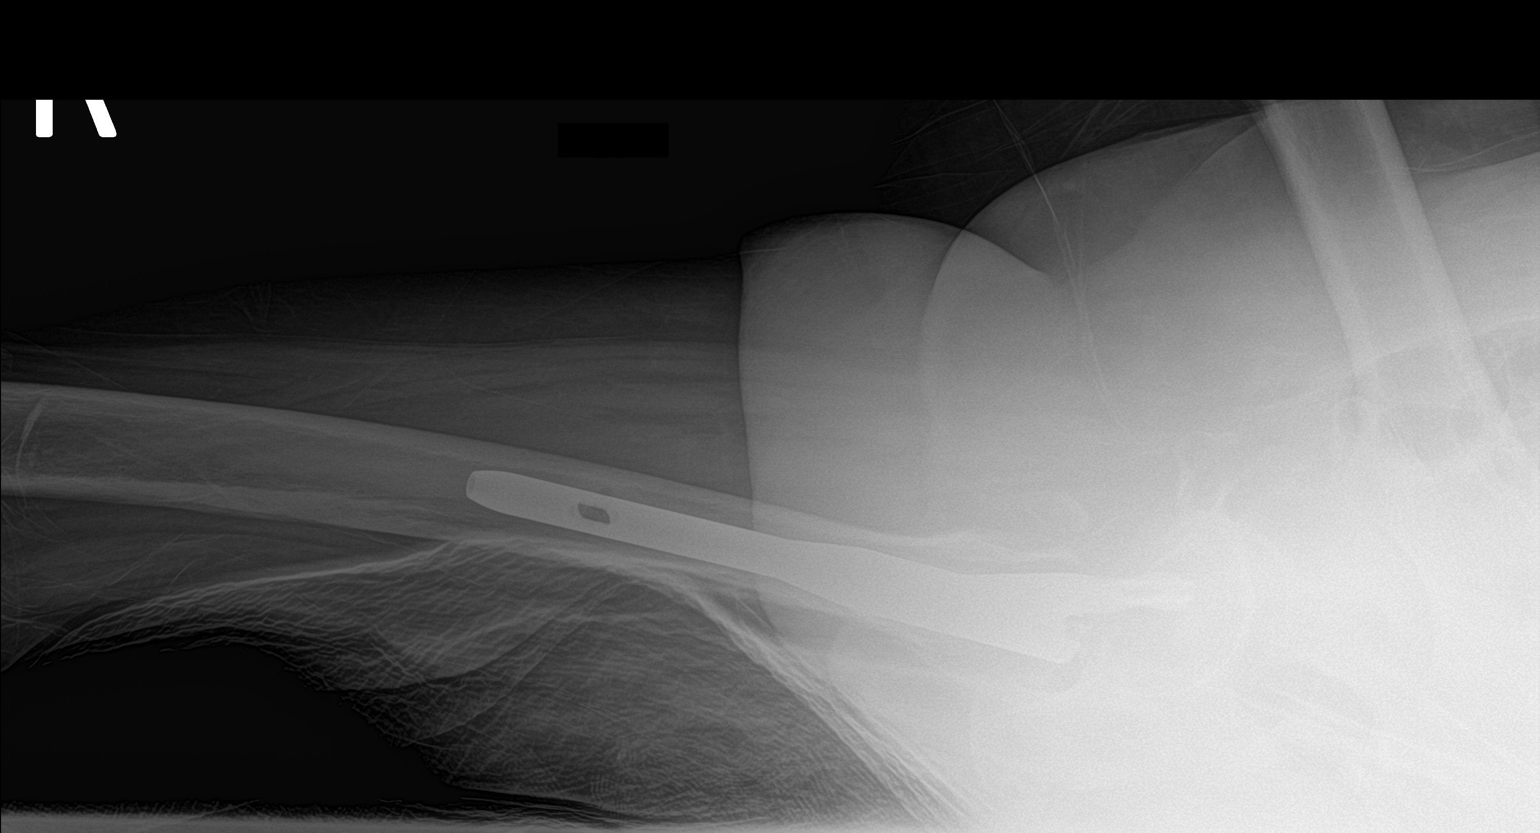

[4 of 4 positions shown; findings below may reference images not displayed]

FINDINGS: Gamma nail with a short intramedullary femoral component is
identified. Screw in the femoral component has been removed. No
acute abnormality is identified. Intertrochanteric fracture is
healed.
IMPRESSION: Status post removal of a screw from intramedullary component of
right hip fracture fixation hardware. No acute finding.

## 2021-02-16 IMAGING — RF DG HIP (WITH OR WITHOUT PELVIS) 2-3V*R*
1 series · 2 of 2 positions shown · non-contrast
Comparison: 05/15/2018

CLINICAL DATA: Hardware removal right femur

EXAM:
DG C-ARM 1-60 MIN; DG HIP (WITH OR WITHOUT PELVIS) 2-3V RIGHT
FLUOROSCOPY TIME:  Fluoroscopy Time:  29 seconds

[Series 1: run · 2 of 2 slices shown]
[im 1/2]
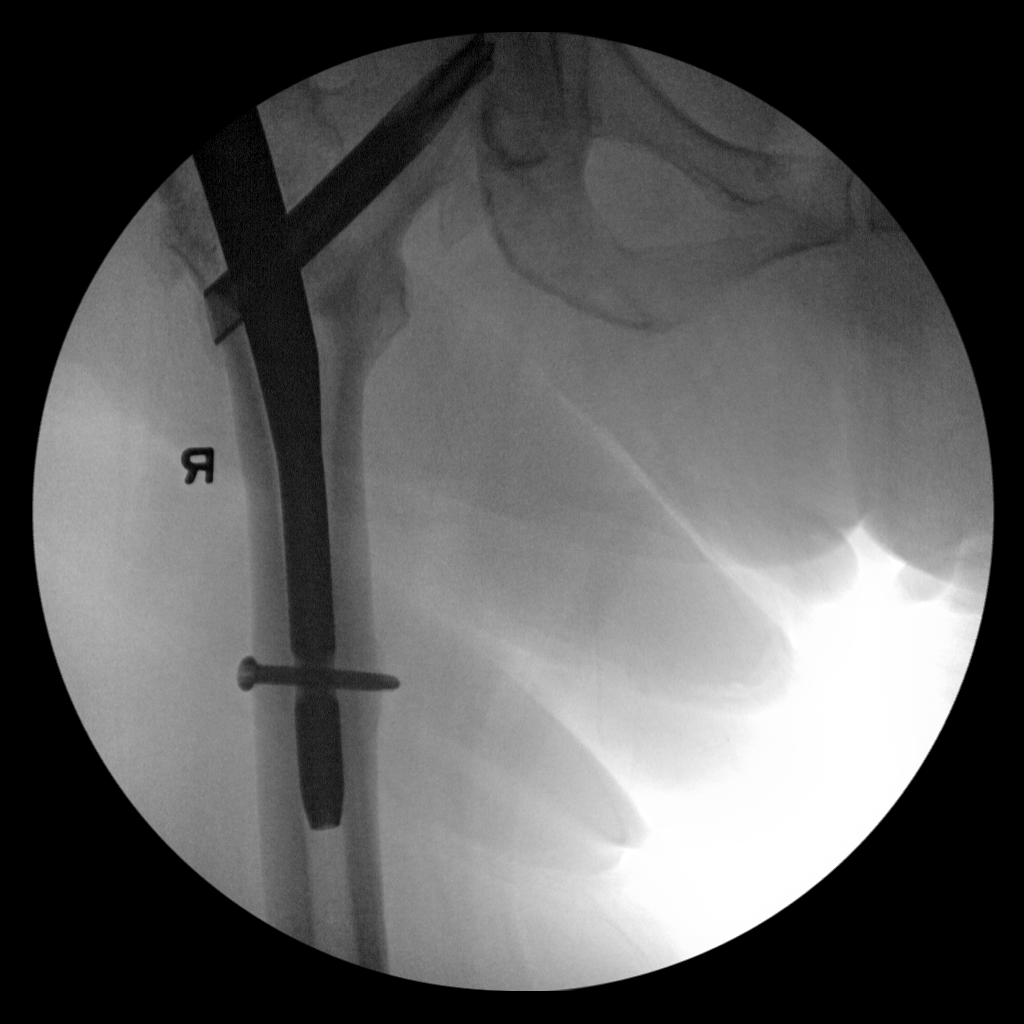
[im 2/2]
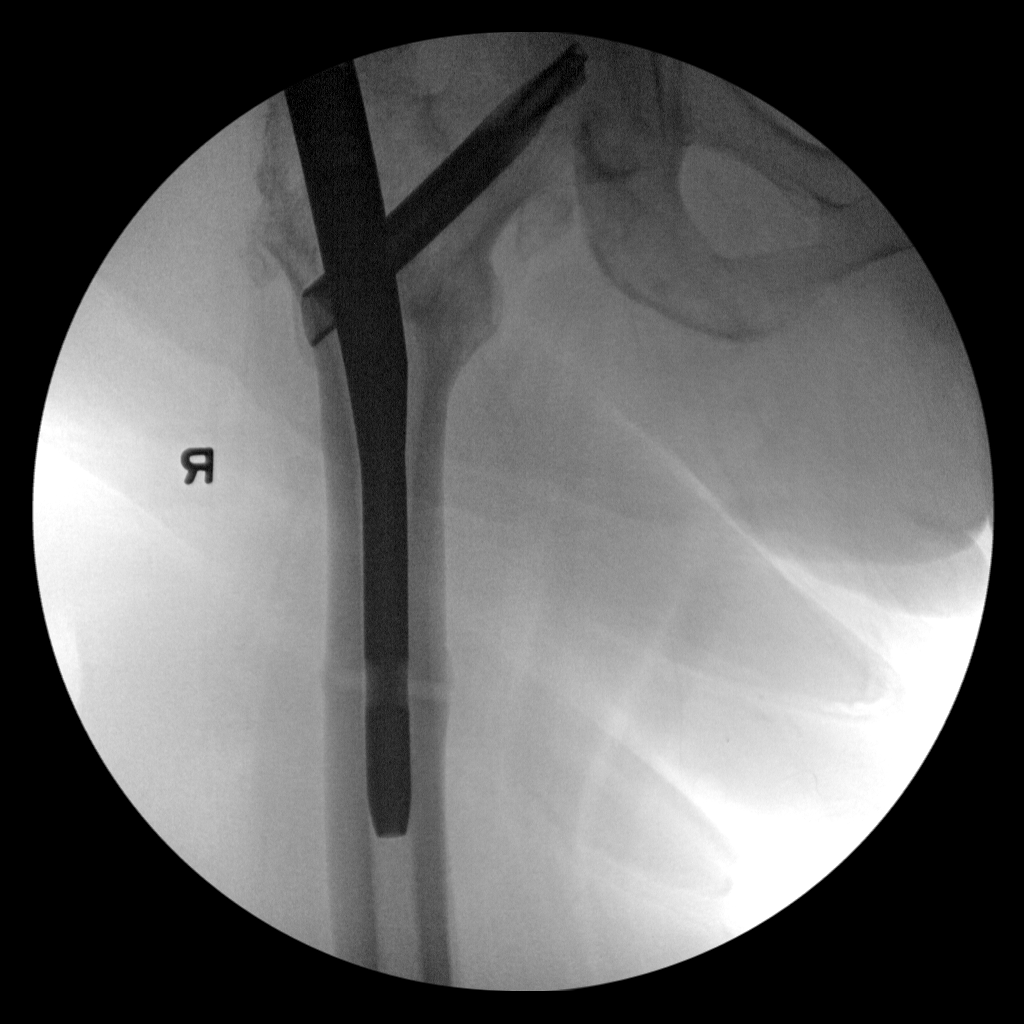

[2 of 2 positions shown; findings below may reference images not displayed]

FINDINGS: Stable IM nail with dynamic hip screw. On the second fluoroscopic
image, the distal interlocking screw has been removed.

Healed intertrochanteric right hip fracture with heterotopic
calcification.
IMPRESSION: ORIF right hip with removal of distal interlocking screw.

## 2021-02-16 IMAGING — RF DG C-ARM 1-60 MIN
1 series · 2 of 2 positions shown · non-contrast
Comparison: 05/15/2018

CLINICAL DATA: Hardware removal right femur

EXAM:
DG C-ARM 1-60 MIN; DG HIP (WITH OR WITHOUT PELVIS) 2-3V RIGHT
FLUOROSCOPY TIME:  Fluoroscopy Time:  29 seconds

[Series 1: run · 2 of 2 slices shown]
[im 1/2]
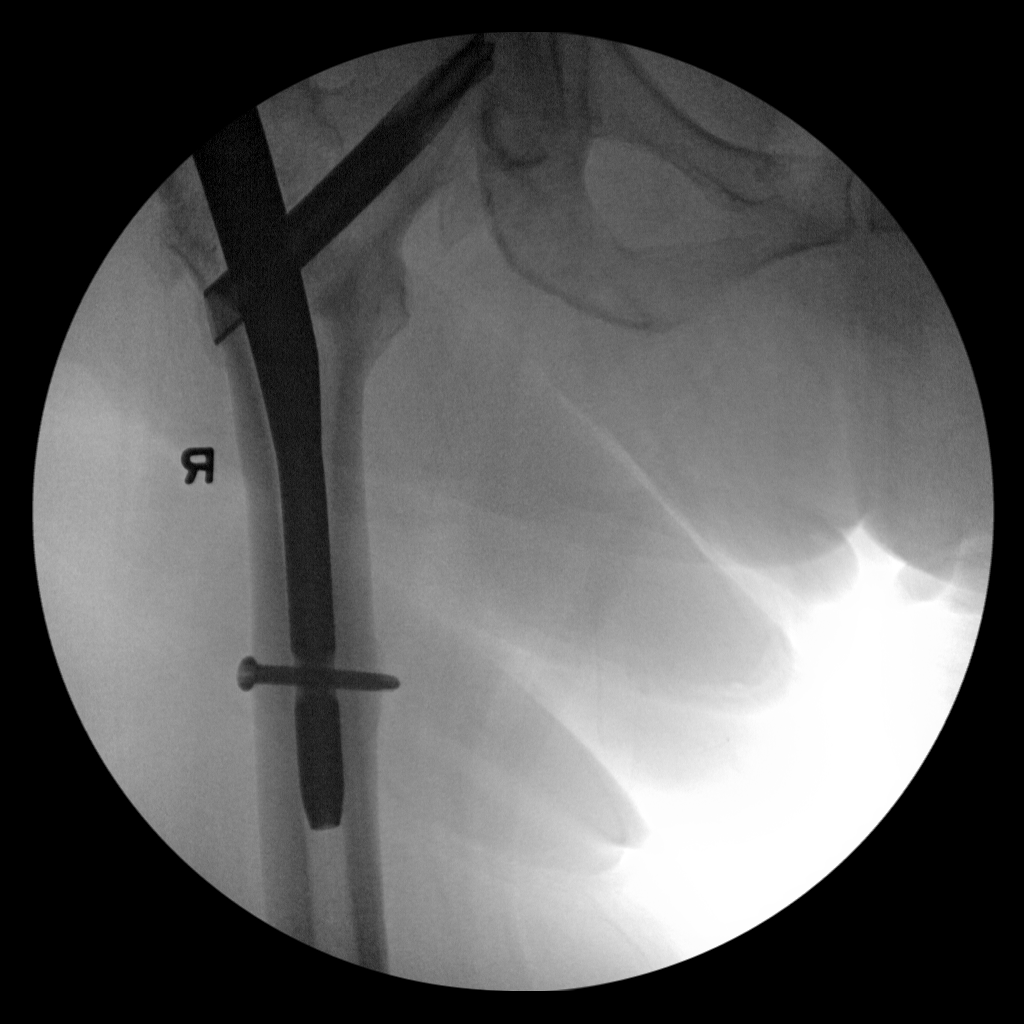
[im 2/2]
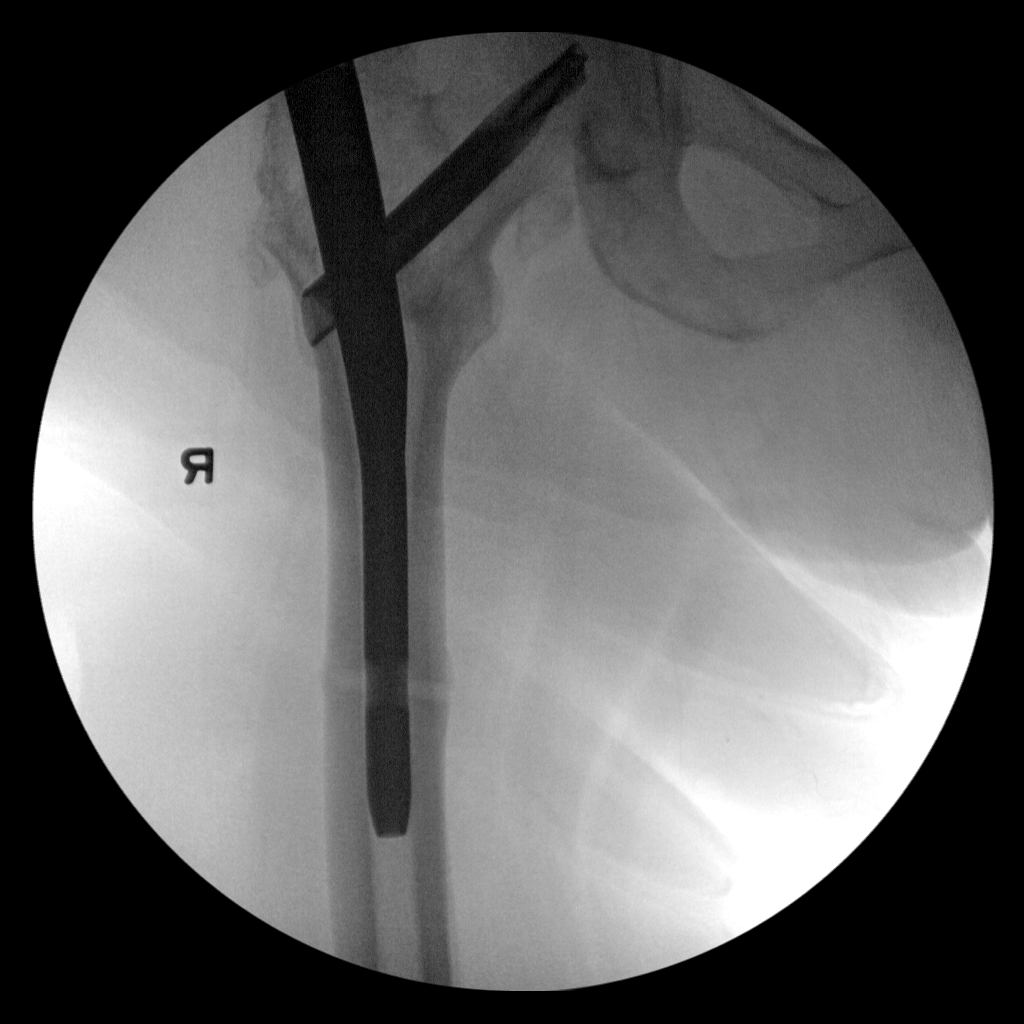

[2 of 2 positions shown; findings below may reference images not displayed]

FINDINGS: Stable IM nail with dynamic hip screw. On the second fluoroscopic
image, the distal interlocking screw has been removed.

Healed intertrochanteric right hip fracture with heterotopic
calcification.
IMPRESSION: ORIF right hip with removal of distal interlocking screw.

## 2021-02-28 DIAGNOSIS — H6123 Impacted cerumen, bilateral: Secondary | ICD-10-CM | POA: Diagnosis not present

## 2021-03-17 ENCOUNTER — Other Ambulatory Visit: Payer: Self-pay

## 2021-03-17 ENCOUNTER — Encounter (INDEPENDENT_AMBULATORY_CARE_PROVIDER_SITE_OTHER): Payer: Medicare PPO | Admitting: Ophthalmology

## 2021-03-17 DIAGNOSIS — H43813 Vitreous degeneration, bilateral: Secondary | ICD-10-CM

## 2021-03-17 DIAGNOSIS — H35033 Hypertensive retinopathy, bilateral: Secondary | ICD-10-CM | POA: Diagnosis not present

## 2021-03-17 DIAGNOSIS — E113313 Type 2 diabetes mellitus with moderate nonproliferative diabetic retinopathy with macular edema, bilateral: Secondary | ICD-10-CM | POA: Diagnosis not present

## 2021-03-17 DIAGNOSIS — I10 Essential (primary) hypertension: Secondary | ICD-10-CM | POA: Diagnosis not present

## 2021-03-25 DIAGNOSIS — M1711 Unilateral primary osteoarthritis, right knee: Secondary | ICD-10-CM | POA: Diagnosis not present

## 2021-03-25 DIAGNOSIS — M170 Bilateral primary osteoarthritis of knee: Secondary | ICD-10-CM | POA: Diagnosis not present

## 2021-03-28 DIAGNOSIS — Z1231 Encounter for screening mammogram for malignant neoplasm of breast: Secondary | ICD-10-CM | POA: Diagnosis not present

## 2021-04-21 ENCOUNTER — Encounter (INDEPENDENT_AMBULATORY_CARE_PROVIDER_SITE_OTHER): Payer: Medicare PPO | Admitting: Ophthalmology

## 2021-04-21 ENCOUNTER — Other Ambulatory Visit: Payer: Self-pay

## 2021-04-21 DIAGNOSIS — H35033 Hypertensive retinopathy, bilateral: Secondary | ICD-10-CM | POA: Diagnosis not present

## 2021-04-21 DIAGNOSIS — I10 Essential (primary) hypertension: Secondary | ICD-10-CM | POA: Diagnosis not present

## 2021-04-21 DIAGNOSIS — H43813 Vitreous degeneration, bilateral: Secondary | ICD-10-CM

## 2021-04-21 DIAGNOSIS — E113313 Type 2 diabetes mellitus with moderate nonproliferative diabetic retinopathy with macular edema, bilateral: Secondary | ICD-10-CM

## 2021-05-12 DIAGNOSIS — M1711 Unilateral primary osteoarthritis, right knee: Secondary | ICD-10-CM | POA: Diagnosis not present

## 2021-05-12 DIAGNOSIS — M25561 Pain in right knee: Secondary | ICD-10-CM | POA: Diagnosis not present

## 2021-05-12 DIAGNOSIS — M1712 Unilateral primary osteoarthritis, left knee: Secondary | ICD-10-CM | POA: Diagnosis not present

## 2021-05-12 DIAGNOSIS — M170 Bilateral primary osteoarthritis of knee: Secondary | ICD-10-CM | POA: Diagnosis not present

## 2021-05-19 DIAGNOSIS — M1711 Unilateral primary osteoarthritis, right knee: Secondary | ICD-10-CM | POA: Diagnosis not present

## 2021-05-25 DIAGNOSIS — M1711 Unilateral primary osteoarthritis, right knee: Secondary | ICD-10-CM | POA: Diagnosis not present

## 2021-05-26 ENCOUNTER — Encounter (INDEPENDENT_AMBULATORY_CARE_PROVIDER_SITE_OTHER): Payer: Medicare PPO | Admitting: Ophthalmology

## 2021-05-26 ENCOUNTER — Other Ambulatory Visit: Payer: Self-pay

## 2021-05-26 DIAGNOSIS — I10 Essential (primary) hypertension: Secondary | ICD-10-CM | POA: Diagnosis not present

## 2021-05-26 DIAGNOSIS — E113313 Type 2 diabetes mellitus with moderate nonproliferative diabetic retinopathy with macular edema, bilateral: Secondary | ICD-10-CM

## 2021-05-26 DIAGNOSIS — H43813 Vitreous degeneration, bilateral: Secondary | ICD-10-CM | POA: Diagnosis not present

## 2021-05-26 DIAGNOSIS — H35033 Hypertensive retinopathy, bilateral: Secondary | ICD-10-CM

## 2021-06-29 DIAGNOSIS — E11311 Type 2 diabetes mellitus with unspecified diabetic retinopathy with macular edema: Secondary | ICD-10-CM | POA: Diagnosis not present

## 2021-06-29 DIAGNOSIS — G301 Alzheimer's disease with late onset: Secondary | ICD-10-CM | POA: Diagnosis not present

## 2021-06-29 DIAGNOSIS — N3281 Overactive bladder: Secondary | ICD-10-CM | POA: Diagnosis not present

## 2021-06-29 DIAGNOSIS — F0280 Dementia in other diseases classified elsewhere without behavioral disturbance: Secondary | ICD-10-CM | POA: Diagnosis not present

## 2021-06-29 DIAGNOSIS — I700 Atherosclerosis of aorta: Secondary | ICD-10-CM | POA: Diagnosis not present

## 2021-06-29 DIAGNOSIS — E083299 Diabetes mellitus due to underlying condition with mild nonproliferative diabetic retinopathy without macular edema, unspecified eye: Secondary | ICD-10-CM | POA: Diagnosis not present

## 2021-06-29 DIAGNOSIS — I10 Essential (primary) hypertension: Secondary | ICD-10-CM | POA: Diagnosis not present

## 2021-07-07 ENCOUNTER — Encounter (INDEPENDENT_AMBULATORY_CARE_PROVIDER_SITE_OTHER): Payer: Medicare PPO | Admitting: Ophthalmology

## 2021-07-12 ENCOUNTER — Encounter (INDEPENDENT_AMBULATORY_CARE_PROVIDER_SITE_OTHER): Payer: Medicare PPO | Admitting: Ophthalmology

## 2021-07-14 ENCOUNTER — Encounter (INDEPENDENT_AMBULATORY_CARE_PROVIDER_SITE_OTHER): Payer: Medicare PPO | Admitting: Ophthalmology

## 2021-07-14 ENCOUNTER — Other Ambulatory Visit: Payer: Self-pay

## 2021-07-14 DIAGNOSIS — H43813 Vitreous degeneration, bilateral: Secondary | ICD-10-CM | POA: Diagnosis not present

## 2021-07-14 DIAGNOSIS — H35033 Hypertensive retinopathy, bilateral: Secondary | ICD-10-CM | POA: Diagnosis not present

## 2021-07-14 DIAGNOSIS — I10 Essential (primary) hypertension: Secondary | ICD-10-CM

## 2021-07-14 DIAGNOSIS — E113313 Type 2 diabetes mellitus with moderate nonproliferative diabetic retinopathy with macular edema, bilateral: Secondary | ICD-10-CM | POA: Diagnosis not present

## 2021-09-02 DIAGNOSIS — E785 Hyperlipidemia, unspecified: Secondary | ICD-10-CM | POA: Diagnosis not present

## 2021-09-02 DIAGNOSIS — G309 Alzheimer's disease, unspecified: Secondary | ICD-10-CM | POA: Diagnosis not present

## 2021-09-02 DIAGNOSIS — G4733 Obstructive sleep apnea (adult) (pediatric): Secondary | ICD-10-CM | POA: Diagnosis not present

## 2021-09-02 DIAGNOSIS — E1151 Type 2 diabetes mellitus with diabetic peripheral angiopathy without gangrene: Secondary | ICD-10-CM | POA: Diagnosis not present

## 2021-09-02 DIAGNOSIS — I10 Essential (primary) hypertension: Secondary | ICD-10-CM | POA: Diagnosis not present

## 2021-09-02 DIAGNOSIS — E039 Hypothyroidism, unspecified: Secondary | ICD-10-CM | POA: Diagnosis not present

## 2021-09-02 DIAGNOSIS — F411 Generalized anxiety disorder: Secondary | ICD-10-CM | POA: Diagnosis not present

## 2021-09-02 DIAGNOSIS — I700 Atherosclerosis of aorta: Secondary | ICD-10-CM | POA: Diagnosis not present

## 2021-10-06 ENCOUNTER — Encounter (INDEPENDENT_AMBULATORY_CARE_PROVIDER_SITE_OTHER): Payer: Medicare PPO | Admitting: Ophthalmology

## 2021-10-18 DIAGNOSIS — R32 Unspecified urinary incontinence: Secondary | ICD-10-CM | POA: Diagnosis not present

## 2021-10-18 DIAGNOSIS — K130 Diseases of lips: Secondary | ICD-10-CM | POA: Diagnosis not present

## 2021-10-18 DIAGNOSIS — B379 Candidiasis, unspecified: Secondary | ICD-10-CM | POA: Diagnosis not present

## 2021-10-18 DIAGNOSIS — E119 Type 2 diabetes mellitus without complications: Secondary | ICD-10-CM | POA: Diagnosis not present

## 2021-10-20 ENCOUNTER — Encounter (INDEPENDENT_AMBULATORY_CARE_PROVIDER_SITE_OTHER): Payer: Medicare PPO | Admitting: Ophthalmology

## 2021-10-24 DIAGNOSIS — I10 Essential (primary) hypertension: Secondary | ICD-10-CM | POA: Diagnosis not present

## 2021-10-24 DIAGNOSIS — G301 Alzheimer's disease with late onset: Secondary | ICD-10-CM | POA: Diagnosis not present

## 2021-10-24 DIAGNOSIS — F5101 Primary insomnia: Secondary | ICD-10-CM | POA: Diagnosis not present

## 2021-10-24 DIAGNOSIS — F0280 Dementia in other diseases classified elsewhere without behavioral disturbance: Secondary | ICD-10-CM | POA: Diagnosis not present

## 2021-10-24 DIAGNOSIS — R269 Unspecified abnormalities of gait and mobility: Secondary | ICD-10-CM | POA: Diagnosis not present

## 2021-10-27 ENCOUNTER — Encounter (INDEPENDENT_AMBULATORY_CARE_PROVIDER_SITE_OTHER): Payer: Medicare PPO | Admitting: Ophthalmology

## 2021-10-27 DIAGNOSIS — H35033 Hypertensive retinopathy, bilateral: Secondary | ICD-10-CM

## 2021-10-27 DIAGNOSIS — E113313 Type 2 diabetes mellitus with moderate nonproliferative diabetic retinopathy with macular edema, bilateral: Secondary | ICD-10-CM | POA: Diagnosis not present

## 2021-10-27 DIAGNOSIS — I10 Essential (primary) hypertension: Secondary | ICD-10-CM | POA: Diagnosis not present

## 2021-10-27 DIAGNOSIS — H43813 Vitreous degeneration, bilateral: Secondary | ICD-10-CM

## 2022-01-02 DIAGNOSIS — I10 Essential (primary) hypertension: Secondary | ICD-10-CM | POA: Diagnosis not present

## 2022-01-02 DIAGNOSIS — K219 Gastro-esophageal reflux disease without esophagitis: Secondary | ICD-10-CM | POA: Diagnosis not present

## 2022-01-02 DIAGNOSIS — Z79899 Other long term (current) drug therapy: Secondary | ICD-10-CM | POA: Diagnosis not present

## 2022-01-02 DIAGNOSIS — E7800 Pure hypercholesterolemia, unspecified: Secondary | ICD-10-CM | POA: Diagnosis not present

## 2022-01-02 DIAGNOSIS — F0280 Dementia in other diseases classified elsewhere without behavioral disturbance: Secondary | ICD-10-CM | POA: Diagnosis not present

## 2022-01-02 DIAGNOSIS — Z0000 Encounter for general adult medical examination without abnormal findings: Secondary | ICD-10-CM | POA: Diagnosis not present

## 2022-01-02 DIAGNOSIS — E042 Nontoxic multinodular goiter: Secondary | ICD-10-CM | POA: Diagnosis not present

## 2022-01-02 DIAGNOSIS — E039 Hypothyroidism, unspecified: Secondary | ICD-10-CM | POA: Diagnosis not present

## 2022-01-02 DIAGNOSIS — I700 Atherosclerosis of aorta: Secondary | ICD-10-CM | POA: Diagnosis not present

## 2022-01-02 DIAGNOSIS — E083291 Diabetes mellitus due to underlying condition with mild nonproliferative diabetic retinopathy without macular edema, right eye: Secondary | ICD-10-CM | POA: Diagnosis not present

## 2022-01-02 DIAGNOSIS — E11311 Type 2 diabetes mellitus with unspecified diabetic retinopathy with macular edema: Secondary | ICD-10-CM | POA: Diagnosis not present

## 2022-03-02 ENCOUNTER — Encounter (INDEPENDENT_AMBULATORY_CARE_PROVIDER_SITE_OTHER): Payer: Medicare PPO | Admitting: Ophthalmology

## 2022-03-15 DIAGNOSIS — E118 Type 2 diabetes mellitus with unspecified complications: Secondary | ICD-10-CM | POA: Diagnosis not present

## 2022-03-15 DIAGNOSIS — L89323 Pressure ulcer of left buttock, stage 3: Secondary | ICD-10-CM | POA: Diagnosis not present

## 2022-03-15 DIAGNOSIS — E039 Hypothyroidism, unspecified: Secondary | ICD-10-CM | POA: Diagnosis not present

## 2022-03-15 DIAGNOSIS — L89891 Pressure ulcer of other site, stage 1: Secondary | ICD-10-CM | POA: Diagnosis not present

## 2022-03-15 DIAGNOSIS — L89611 Pressure ulcer of right heel, stage 1: Secondary | ICD-10-CM | POA: Diagnosis not present

## 2022-03-15 DIAGNOSIS — I10 Essential (primary) hypertension: Secondary | ICD-10-CM | POA: Diagnosis not present

## 2022-03-20 DIAGNOSIS — L89159 Pressure ulcer of sacral region, unspecified stage: Secondary | ICD-10-CM | POA: Diagnosis not present

## 2022-03-20 DIAGNOSIS — Z23 Encounter for immunization: Secondary | ICD-10-CM | POA: Diagnosis not present

## 2022-03-20 DIAGNOSIS — E1169 Type 2 diabetes mellitus with other specified complication: Secondary | ICD-10-CM | POA: Diagnosis not present

## 2022-03-20 DIAGNOSIS — G301 Alzheimer's disease with late onset: Secondary | ICD-10-CM | POA: Diagnosis not present

## 2022-05-12 DEATH — deceased
